# Patient Record
Sex: Female | Born: 1950
Health system: Southern US, Community
[De-identification: ages and names within clinical notes are randomized; demographics above are authoritative.]

## PROBLEM LIST (undated history)

## (undated) DIAGNOSIS — F32A Depression, unspecified: Secondary | ICD-10-CM

## (undated) DIAGNOSIS — F329 Major depressive disorder, single episode, unspecified: Secondary | ICD-10-CM

## (undated) DIAGNOSIS — I10 Essential (primary) hypertension: Secondary | ICD-10-CM

## (undated) DIAGNOSIS — E785 Hyperlipidemia, unspecified: Secondary | ICD-10-CM

## (undated) DIAGNOSIS — G2581 Restless legs syndrome: Secondary | ICD-10-CM

## (undated) DIAGNOSIS — T7840XA Allergy, unspecified, initial encounter: Secondary | ICD-10-CM

## (undated) HISTORY — PX: ABDOMINAL HYSTERECTOMY: SHX81

## (undated) HISTORY — DX: Essential (primary) hypertension: I10

## (undated) HISTORY — DX: Major depressive disorder, single episode, unspecified: F32.9

## (undated) HISTORY — DX: Restless legs syndrome: G25.81

## (undated) HISTORY — DX: Hyperlipidemia, unspecified: E78.5

## (undated) HISTORY — DX: Depression, unspecified: F32.A

## (undated) HISTORY — DX: Allergy, unspecified, initial encounter: T78.40XA

---

## 1997-09-30 ENCOUNTER — Inpatient Hospital Stay (HOSPITAL_COMMUNITY): Admission: RE | Admit: 1997-09-30 | Discharge: 1997-10-02 | Payer: Self-pay | Admitting: *Deleted

## 1998-04-22 ENCOUNTER — Other Ambulatory Visit: Admission: RE | Admit: 1998-04-22 | Discharge: 1998-04-22 | Payer: Self-pay | Admitting: *Deleted

## 1998-05-19 ENCOUNTER — Encounter: Payer: Self-pay | Admitting: *Deleted

## 1998-05-19 ENCOUNTER — Ambulatory Visit (HOSPITAL_COMMUNITY): Admission: RE | Admit: 1998-05-19 | Discharge: 1998-05-19 | Payer: Self-pay | Admitting: *Deleted

## 1999-11-10 ENCOUNTER — Ambulatory Visit (HOSPITAL_COMMUNITY): Admission: RE | Admit: 1999-11-10 | Discharge: 1999-11-10 | Payer: Self-pay | Admitting: *Deleted

## 1999-11-10 ENCOUNTER — Encounter: Payer: Self-pay | Admitting: *Deleted

## 1999-11-15 ENCOUNTER — Encounter: Payer: Self-pay | Admitting: *Deleted

## 1999-11-15 ENCOUNTER — Encounter: Admission: RE | Admit: 1999-11-15 | Discharge: 1999-11-15 | Payer: Self-pay | Admitting: *Deleted

## 2011-06-16 ENCOUNTER — Ambulatory Visit: Payer: Self-pay | Admitting: Family Medicine

## 2012-11-14 ENCOUNTER — Ambulatory Visit: Payer: Self-pay | Admitting: Family Medicine

## 2013-12-26 ENCOUNTER — Ambulatory Visit: Payer: Self-pay | Admitting: Family Medicine

## 2015-02-23 ENCOUNTER — Encounter: Payer: Self-pay | Admitting: Family Medicine

## 2015-02-23 ENCOUNTER — Other Ambulatory Visit: Payer: Self-pay

## 2015-02-23 DIAGNOSIS — Z1211 Encounter for screening for malignant neoplasm of colon: Secondary | ICD-10-CM

## 2015-05-31 ENCOUNTER — Other Ambulatory Visit: Payer: Self-pay | Admitting: Family Medicine

## 2015-05-31 NOTE — Telephone Encounter (Signed)
apt 

## 2015-06-17 ENCOUNTER — Encounter: Payer: Self-pay | Admitting: Family Medicine

## 2015-06-17 ENCOUNTER — Ambulatory Visit (INDEPENDENT_AMBULATORY_CARE_PROVIDER_SITE_OTHER): Payer: PRIVATE HEALTH INSURANCE | Admitting: Family Medicine

## 2015-06-17 VITALS — BP 109/69 | HR 80 | Temp 97.9°F | Ht 64.5 in | Wt 163.0 lb

## 2015-06-17 DIAGNOSIS — E785 Hyperlipidemia, unspecified: Secondary | ICD-10-CM

## 2015-06-17 DIAGNOSIS — I10 Essential (primary) hypertension: Secondary | ICD-10-CM | POA: Diagnosis not present

## 2015-06-17 DIAGNOSIS — F329 Major depressive disorder, single episode, unspecified: Secondary | ICD-10-CM | POA: Diagnosis not present

## 2015-06-17 DIAGNOSIS — Z23 Encounter for immunization: Secondary | ICD-10-CM

## 2015-06-17 DIAGNOSIS — F32A Depression, unspecified: Secondary | ICD-10-CM | POA: Insufficient documentation

## 2015-06-17 LAB — LP+ALT+AST PICCOLO, WAIVED
ALT (SGPT) PICCOLO, WAIVED: 19 U/L (ref 10–47)
AST (SGOT) PICCOLO, WAIVED: 25 U/L (ref 11–38)
Chol/HDL Ratio Piccolo,Waive: 4.3 mg/dL
Cholesterol Piccolo, Waived: 234 mg/dL — ABNORMAL HIGH (ref <200)
HDL CHOL PICCOLO, WAIVED: 54 mg/dL — AB (ref >59)
LDL Chol Calc Piccolo Waived: 150 mg/dL — ABNORMAL HIGH (ref <100)
TRIGLYCERIDES PICCOLO,WAIVED: 144 mg/dL (ref <150)
VLDL Chol Calc Piccolo,Waive: 29 mg/dL (ref <30)

## 2015-06-17 MED ORDER — LOSARTAN POTASSIUM-HCTZ 100-12.5 MG PO TABS: 1.0000 | ORAL_TABLET | Freq: Every day | ORAL | Status: DC

## 2015-06-17 MED ORDER — PRAVASTATIN SODIUM 20 MG PO TABS: 20.0000 mg | ORAL_TABLET | Freq: Every day | ORAL | Status: DC

## 2015-06-17 MED ORDER — FLUOXETINE HCL 20 MG PO CAPS: 60.0000 mg | ORAL_CAPSULE | Freq: Every day | ORAL | Status: DC

## 2015-06-17 NOTE — Assessment & Plan Note (Signed)
The current medical regimen is effective;  continue present plan and medications.  

## 2015-06-17 NOTE — Progress Notes (Signed)
BP 109/69 mmHg  Pulse 80  Temp(Src) 97.9 F (36.6 C)  Ht 5' 4.5" (1.638 m)  Wt 163 lb (73.936 kg)  BMI 27.56 kg/m2  SpO2 99%   Subjective:    Patient ID: Michelle Espinoza, female    DOB: 12/04/1950, 64 y.o.   MRN: [id number]  HPI: Michelle Espinoza is a 64 y.o. female  Chief Complaint  Patient presents with  . Hypertension  . Hyperlipidemia   Patient follow-up medication and med check Having some issues remembering medicines on a faithful basis and Mrs. from time to time Depression stable No side effects from medications  Relevant past medical, surgical, family and social history reviewed and updated as indicated. Interim medical history since our last visit reviewed. Allergies and medications reviewed and updated.  Review of Systems  Constitutional: Negative.   Respiratory: Negative.   Cardiovascular: Negative.     Per HPI unless specifically indicated above     Objective:    BP 109/69 mmHg  Pulse 80  Temp(Src) 97.9 F (36.6 C)  Ht 5' 4.5" (1.638 m)  Wt 163 lb (73.936 kg)  BMI 27.56 kg/m2  SpO2 99%  Wt Readings from Last 3 Encounters:  06/17/15 163 lb (73.936 kg)  11/10/14 162 lb (73.483 kg)    Physical Exam  Constitutional: She is oriented to person, place, and time. She appears well-developed and well-nourished. No distress.  HENT:  Head: Normocephalic and atraumatic.  Right Ear: Hearing normal.  Left Ear: Hearing normal.  Nose: Nose normal.  Eyes: Conjunctivae and lids are normal. Right eye exhibits no discharge. Left eye exhibits no discharge. No scleral icterus.  Cardiovascular: Normal rate, regular rhythm and normal heart sounds.   Pulmonary/Chest: Effort normal and breath sounds normal. No respiratory distress.  Musculoskeletal: Normal range of motion.  Neurological: She is alert and oriented to person, place, and time.  Skin: Skin is intact. No rash noted.  Psychiatric: She has a normal mood and affect. Her speech is normal and behavior is  normal. Judgment and thought content normal. Cognition and memory are normal.    No results found for this or any previous visit.    Assessment & Plan:   Problem List Items Addressed This Visit      Cardiovascular and Mediastinum   Essential hypertension    The current medical regimen is effective;  continue present plan and medications.       Relevant Medications   losartan-hydrochlorothiazide (HYZAAR) 100-12.5 MG tablet   pravastatin (PRAVACHOL) 20 MG tablet     Other   Hyperlipidemia    Poor control with intermittent compliance Discuss linking behaviors and ways to remember medicine every day Discuss risk factor modification and importance      Relevant Medications   losartan-hydrochlorothiazide (HYZAAR) 100-12.5 MG tablet   pravastatin (PRAVACHOL) 20 MG tablet   Depression    The current medical regimen is effective;  continue present plan and medications.       Relevant Medications   FLUoxetine (PROZAC) 20 MG capsule    Other Visit Diagnoses    Essential hypertension, benign    -  Primary    Relevant Medications    losartan-hydrochlorothiazide (HYZAAR) 100-12.5 MG tablet    pravastatin (PRAVACHOL) 20 MG tablet    Other Relevant Orders    LP+ALT+AST Piccolo, Waived    Basic metabolic panel    Hyperlipemia        Relevant Medications    losartan-hydrochlorothiazide (HYZAAR) 100-12.5 MG tablet  pravastatin (PRAVACHOL) 20 MG tablet    Other Relevant Orders    LP+ALT+AST Piccolo, Waived    Basic metabolic panel    Immunization due        Relevant Orders    Flu Vaccine QUAD 36+ mos PF IM (Fluarix & Fluzone Quad PF) (Completed)        Follow up plan: Return in about 6 months (around 12/16/2015) for Physical Exam.

## 2015-06-17 NOTE — Assessment & Plan Note (Signed)
Poor control with intermittent compliance Discuss linking behaviors and ways to remember medicine every day Discuss risk factor modification and importance

## 2015-06-18 LAB — BASIC METABOLIC PANEL
BUN/Creatinine Ratio: 18 (ref 11–26)
BUN: 14 mg/dL (ref 8–27)
CALCIUM: 10 mg/dL (ref 8.7–10.3)
CHLORIDE: 102 mmol/L (ref 97–106)
CO2: 24 mmol/L (ref 18–29)
Creatinine, Ser: 0.79 mg/dL (ref 0.57–1.00)
GFR calc Af Amer: 92 mL/min/{1.73_m2} (ref >59)
GFR calc non Af Amer: 80 mL/min/{1.73_m2} (ref >59)
GLUCOSE: 95 mg/dL (ref 65–99)
Potassium: 4.8 mmol/L (ref 3.5–5.2)
Sodium: 142 mmol/L (ref 136–144)

## 2015-06-21 ENCOUNTER — Encounter: Payer: Self-pay | Admitting: Family Medicine

## 2015-11-16 ENCOUNTER — Ambulatory Visit (INDEPENDENT_AMBULATORY_CARE_PROVIDER_SITE_OTHER): Payer: BLUE CROSS/BLUE SHIELD | Admitting: Family Medicine

## 2015-11-16 ENCOUNTER — Encounter: Payer: Self-pay | Admitting: Family Medicine

## 2015-11-16 VITALS — BP 121/79 | HR 76 | Temp 98.0°F | Ht 63.7 in | Wt 163.0 lb

## 2015-11-16 DIAGNOSIS — Z0000 Encounter for general adult medical examination without abnormal findings: Secondary | ICD-10-CM | POA: Diagnosis not present

## 2015-11-16 DIAGNOSIS — E785 Hyperlipidemia, unspecified: Secondary | ICD-10-CM | POA: Diagnosis not present

## 2015-11-16 DIAGNOSIS — I10 Essential (primary) hypertension: Secondary | ICD-10-CM | POA: Diagnosis not present

## 2015-11-16 DIAGNOSIS — F329 Major depressive disorder, single episode, unspecified: Secondary | ICD-10-CM

## 2015-11-16 DIAGNOSIS — F32A Depression, unspecified: Secondary | ICD-10-CM

## 2015-11-16 DIAGNOSIS — Z1239 Encounter for other screening for malignant neoplasm of breast: Secondary | ICD-10-CM

## 2015-11-16 LAB — URINALYSIS, ROUTINE W REFLEX MICROSCOPIC
BILIRUBIN UA: NEGATIVE
Glucose, UA: NEGATIVE
KETONES UA: NEGATIVE
LEUKOCYTES UA: NEGATIVE
Nitrite, UA: NEGATIVE
PROTEIN UA: NEGATIVE
SPEC GRAV UA: 1.015 (ref 1.005–1.030)
Urobilinogen, Ur: 0.2 mg/dL (ref 0.2–1.0)
pH, UA: 7 (ref 5.0–7.5)

## 2015-11-16 LAB — MICROSCOPIC EXAMINATION

## 2015-11-16 MED ORDER — PRAVASTATIN SODIUM 20 MG PO TABS: 20.0000 mg | ORAL_TABLET | Freq: Every day | ORAL | Status: DC

## 2015-11-16 MED ORDER — LOSARTAN POTASSIUM-HCTZ 100-12.5 MG PO TABS: 1.0000 | ORAL_TABLET | Freq: Every day | ORAL | Status: DC

## 2015-11-16 MED ORDER — FLUOXETINE HCL 20 MG PO CAPS: 60.0000 mg | ORAL_CAPSULE | Freq: Every day | ORAL | Status: DC

## 2015-11-16 NOTE — Assessment & Plan Note (Signed)
The current medical regimen is effective;  continue present plan and medications.  

## 2015-11-16 NOTE — Progress Notes (Signed)
BP 121/79 mmHg  Pulse 76  Temp(Src) 98 F (36.7 C)  Ht 5' 3.7" (1.618 m)  Wt 163 lb (73.936 kg)  BMI 28.24 kg/m2  SpO2 96%   Subjective:    Patient ID: Michelle Espinoza, female    DOB: 17-Aug-1950, 65 y.o.   MRN: [id number]  HPI: Michelle Espinoza is a 65 y.o. female  Chief Complaint  Patient presents with  . Annual Exam   Patient follow-up fluoxetine for nerves which are doing well. Score of 0 on depression screening Blood pressure doing well no complaints from medications Cholesterol doing well no complaints from medications Takes medications faithfully without side effects Has small sebaceous cysts which occasionally gets sore and is expressed as some questions about care and treatment.  Relevant past medical, surgical, family and social history reviewed and updated as indicated. Interim medical history since our last visit reviewed. Allergies and medications reviewed and updated.  Review of Systems  Constitutional: Negative.   HENT: Negative.   Eyes: Negative.   Respiratory: Negative.   Cardiovascular: Negative.   Gastrointestinal: Negative.   Endocrine: Negative.   Genitourinary: Negative.   Musculoskeletal: Negative.   Skin: Negative.   Allergic/Immunologic: Negative.   Neurological: Negative.   Hematological: Negative.   Psychiatric/Behavioral: Negative.     Per HPI unless specifically indicated above     Objective:    BP 121/79 mmHg  Pulse 76  Temp(Src) 98 F (36.7 C)  Ht 5' 3.7" (1.618 m)  Wt 163 lb (73.936 kg)  BMI 28.24 kg/m2  SpO2 96%  Wt Readings from Last 3 Encounters:  11/16/15 163 lb (73.936 kg)  06/17/15 163 lb (73.936 kg)  11/10/14 162 lb (73.483 kg)    Physical Exam  Constitutional: She is oriented to person, place, and time. She appears well-developed and well-nourished.  HENT:  Head: Normocephalic and atraumatic.  Right Ear: External ear normal.  Left Ear: External ear normal.  Nose: Nose normal.  Mouth/Throat: Oropharynx is  clear and moist.  Eyes: Conjunctivae and EOM are normal. Pupils are equal, round, and reactive to light.  Neck: Normal range of motion. Neck supple. Carotid bruit is not present.  Cardiovascular: Normal rate, regular rhythm and normal heart sounds.   No murmur heard. Pulmonary/Chest: Effort normal and breath sounds normal. She exhibits no mass. Right breast exhibits no mass, no skin change and no tenderness. Left breast exhibits no mass, no skin change and no tenderness. Breasts are symmetrical.  Abdominal: Soft. Bowel sounds are normal. There is no hepatosplenomegaly.  Musculoskeletal: Normal range of motion.  Neurological: She is alert and oriented to person, place, and time.  Skin: No rash noted.  Psychiatric: She has a normal mood and affect. Her behavior is normal. Judgment and thought content normal.    Results for orders placed or performed in visit on 06/17/15  LP+ALT+AST Piccolo, [employer]  Result Value Ref Range   ALT (SGPT) Piccolo, Waived 19 10 - 47 U/L   AST (SGOT) Piccolo, Waived 25 11 - 38 U/L   Cholesterol Piccolo, Waived 234 (H) <200 mg/dL   HDL Chol Piccolo, Waived 54 (L) >59 mg/dL   Triglycerides Piccolo,Waived 144 <150 mg/dL   Chol/HDL Ratio Piccolo,Waive 4.3 mg/dL   LDL Chol Calc Piccolo Waived 150 (H) <100 mg/dL   VLDL Chol Calc Piccolo,Waive 29 <30 mg/dL  Basic metabolic panel  Result Value Ref Range   Glucose 95 65 - 99 mg/dL   BUN 14 8 - 27 mg/dL   Creatinine,  Ser 0.79 0.57 - 1.00 mg/dL   GFR calc non Af Amer 80 >59 mL/min/1.73   GFR calc Af Amer 92 >59 mL/min/1.73   BUN/Creatinine Ratio 18 11 - 26   Sodium 142 136 - 144 mmol/L   Potassium 4.8 3.5 - 5.2 mmol/L   Chloride 102 97 - 106 mmol/L   CO2 24 18 - 29 mmol/L   Calcium 10.0 8.7 - 10.3 mg/dL      Assessment & Plan:   Problem List Items Addressed This Visit      Cardiovascular and Mediastinum   Essential hypertension    The current medical regimen is effective;  continue present plan and  medications.       Relevant Medications   losartan-hydrochlorothiazide (HYZAAR) 100-12.5 MG tablet   pravastatin (PRAVACHOL) 20 MG tablet     Other   Hyperlipidemia    The current medical regimen is effective;  continue present plan and medications.       Relevant Medications   losartan-hydrochlorothiazide (HYZAAR) 100-12.5 MG tablet   pravastatin (PRAVACHOL) 20 MG tablet   Depression    The current medical regimen is effective;  continue present plan and medications.       Relevant Medications   FLUoxetine (PROZAC) 20 MG capsule    Other Visit Diagnoses    Routine general medical examination at a health care facility    -  Primary    Relevant Orders    CBC with Differential/Platelet    Comprehensive metabolic panel    Lipid Panel w/o Chol/HDL Ratio    TSH    Urinalysis, Routine w reflex microscopic (not at [facility])    Healthcare maintenance        Relevant Orders    Hepatitis C Antibody    Breast cancer screening        Relevant Orders    MM Digital Screening      Patient education given on sebaceous cyst care and treatment will continue for now occasional expressing after hot compresses.   Follow up plan: Return in about 6 months (around 05/18/2016), or if symptoms worsen or fail to improve, for BMP, lipids, alt, ast.

## 2015-11-17 ENCOUNTER — Telehealth: Payer: Self-pay | Admitting: Family Medicine

## 2015-11-17 DIAGNOSIS — E875 Hyperkalemia: Secondary | ICD-10-CM

## 2015-11-17 LAB — COMPREHENSIVE METABOLIC PANEL
A/G RATIO: 1.6 (ref 1.2–2.2)
ALT: 14 IU/L (ref 0–32)
AST: 19 IU/L (ref 0–40)
Albumin: 4.4 g/dL (ref 3.6–4.8)
Alkaline Phosphatase: 78 IU/L (ref 39–117)
BILIRUBIN TOTAL: 0.3 mg/dL (ref 0.0–1.2)
BUN/Creatinine Ratio: 20 (ref 12–28)
BUN: 17 mg/dL (ref 8–27)
CHLORIDE: 101 mmol/L (ref 96–106)
CO2: 25 mmol/L (ref 18–29)
Calcium: 10.1 mg/dL (ref 8.7–10.3)
Creatinine, Ser: 0.85 mg/dL (ref 0.57–1.00)
GFR, EST AFRICAN AMERICAN: 84 mL/min/{1.73_m2} (ref >59)
GFR, EST NON AFRICAN AMERICAN: 73 mL/min/{1.73_m2} (ref >59)
GLOBULIN, TOTAL: 2.7 g/dL (ref 1.5–4.5)
GLUCOSE: 81 mg/dL (ref 65–99)
POTASSIUM: 5.4 mmol/L — AB (ref 3.5–5.2)
SODIUM: 141 mmol/L (ref 134–144)
Total Protein: 7.1 g/dL (ref 6.0–8.5)

## 2015-11-17 LAB — LIPID PANEL W/O CHOL/HDL RATIO
Cholesterol, Total: 224 mg/dL — ABNORMAL HIGH (ref 100–199)
HDL: 53 mg/dL (ref >39)
LDL Calculated: 144 mg/dL — ABNORMAL HIGH (ref 0–99)
Triglycerides: 137 mg/dL (ref 0–149)
VLDL CHOLESTEROL CAL: 27 mg/dL (ref 5–40)

## 2015-11-17 LAB — HEPATITIS C ANTIBODY

## 2015-11-17 LAB — CBC WITH DIFFERENTIAL/PLATELET
BASOS ABS: 0.1 10*3/uL (ref 0.0–0.2)
Basos: 1 %
EOS (ABSOLUTE): 0.5 10*3/uL — AB (ref 0.0–0.4)
Eos: 6 %
Hematocrit: 41.8 % (ref 34.0–46.6)
Hemoglobin: 14 g/dL (ref 11.1–15.9)
Immature Grans (Abs): 0 10*3/uL (ref 0.0–0.1)
Immature Granulocytes: 0 %
LYMPHS ABS: 1.8 10*3/uL (ref 0.7–3.1)
Lymphs: 24 %
MCH: 31.4 pg (ref 26.6–33.0)
MCHC: 33.5 g/dL (ref 31.5–35.7)
MCV: 94 fL (ref 79–97)
MONOS ABS: 0.6 10*3/uL (ref 0.1–0.9)
Monocytes: 8 %
NEUTROS ABS: 4.5 10*3/uL (ref 1.4–7.0)
Neutrophils: 61 %
PLATELETS: 333 10*3/uL (ref 150–379)
RBC: 4.46 x10E6/uL (ref 3.77–5.28)
RDW: 13.9 % (ref 12.3–15.4)
WBC: 7.4 10*3/uL (ref 3.4–10.8)

## 2015-11-17 LAB — TSH: TSH: 3.77 u[IU]/mL (ref 0.450–4.500)

## 2015-11-17 NOTE — Telephone Encounter (Signed)
Phone call Discussed with patient elevated potassium patient not taking her potassium supplements will recheck BMP in a couple of weeks. Asian taking her cholesterol which is elevated and do better with diet taking pravastatin without problems.

## 2015-12-23 ENCOUNTER — Ambulatory Visit
Admission: RE | Admit: 2015-12-23 | Discharge: 2015-12-23 | Disposition: A | Discharge status: Home / Self Care | Payer: BLUE CROSS/BLUE SHIELD | Source: Ambulatory Visit | Attending: Family Medicine | Admitting: Family Medicine

## 2015-12-23 ENCOUNTER — Other Ambulatory Visit: Payer: Self-pay | Admitting: Family Medicine

## 2015-12-23 ENCOUNTER — Encounter: Payer: Self-pay | Admitting: Family Medicine

## 2015-12-23 DIAGNOSIS — Z1239 Encounter for other screening for malignant neoplasm of breast: Secondary | ICD-10-CM

## 2015-12-23 DIAGNOSIS — Z1231 Encounter for screening mammogram for malignant neoplasm of breast: Secondary | ICD-10-CM | POA: Insufficient documentation

## 2015-12-27 ENCOUNTER — Other Ambulatory Visit: Payer: BLUE CROSS/BLUE SHIELD

## 2015-12-27 DIAGNOSIS — E875 Hyperkalemia: Secondary | ICD-10-CM

## 2015-12-28 ENCOUNTER — Telehealth: Payer: Self-pay | Admitting: Family Medicine

## 2015-12-28 LAB — BASIC METABOLIC PANEL
BUN/Creatinine Ratio: 20 (ref 12–28)
BUN: 17 mg/dL (ref 8–27)
CALCIUM: 9.5 mg/dL (ref 8.7–10.3)
CO2: 22 mmol/L (ref 18–29)
Chloride: 102 mmol/L (ref 96–106)
Creatinine, Ser: 0.83 mg/dL (ref 0.57–1.00)
GFR, EST AFRICAN AMERICAN: 86 mL/min/{1.73_m2} (ref >59)
GFR, EST NON AFRICAN AMERICAN: 75 mL/min/{1.73_m2} (ref >59)
Glucose: 74 mg/dL (ref 65–99)
POTASSIUM: 5.3 mmol/L — AB (ref 3.5–5.2)
Sodium: 140 mmol/L (ref 134–144)

## 2015-12-28 NOTE — Telephone Encounter (Signed)
Phone call Discussed with patient potassium still slightly high and not doing any potassium supplements will observe recheck BMP next visit

## 2016-01-10 ENCOUNTER — Other Ambulatory Visit: Payer: Self-pay

## 2016-01-10 DIAGNOSIS — Z1211 Encounter for screening for malignant neoplasm of colon: Secondary | ICD-10-CM

## 2016-01-10 LAB — FECAL OCCULT BLOOD, GUAIAC
SPECIMEN 3: NEGATIVE
Specimen 1: NEGATIVE
Specimen 2: NEGATIVE

## 2016-05-29 ENCOUNTER — Ambulatory Visit (INDEPENDENT_AMBULATORY_CARE_PROVIDER_SITE_OTHER): Payer: BLUE CROSS/BLUE SHIELD | Admitting: Family Medicine

## 2016-05-29 ENCOUNTER — Encounter: Payer: Self-pay | Admitting: Family Medicine

## 2016-05-29 VITALS — BP 128/85 | HR 76 | Temp 98.2°F | Wt 171.0 lb

## 2016-05-29 DIAGNOSIS — Z23 Encounter for immunization: Secondary | ICD-10-CM | POA: Diagnosis not present

## 2016-05-29 DIAGNOSIS — I10 Essential (primary) hypertension: Secondary | ICD-10-CM

## 2016-05-29 DIAGNOSIS — E782 Mixed hyperlipidemia: Secondary | ICD-10-CM

## 2016-05-29 LAB — LP+ALT+AST PICCOLO, WAIVED
ALT (SGPT) PICCOLO, WAIVED: 14 U/L (ref 10–47)
AST (SGOT) Piccolo, Waived: 26 U/L (ref 11–38)
CHOL/HDL RATIO PICCOLO,WAIVE: 3.8 mg/dL
Cholesterol Piccolo, Waived: 135 mg/dL (ref <200)
HDL CHOL PICCOLO, WAIVED: 55 mg/dL — AB (ref >59)
LDL Chol Calc Piccolo Waived: 129 mg/dL — ABNORMAL HIGH (ref <100)
TRIGLYCERIDES PICCOLO,WAIVED: 135 mg/dL (ref <150)
VLDL CHOL CALC PICCOLO,WAIVE: 27 mg/dL (ref <30)

## 2016-05-29 NOTE — Progress Notes (Signed)
   BP 128/85   Pulse 76   Temp 98.2 F (36.8 C)   Wt 171 lb (77.6 kg)   SpO2 98%   BMI 29.63 kg/m    Subjective:    Patient ID: Michelle Espinoza, female    DOB: [birth year], 65 y.o.   MRN: [id number]  HPI: Michelle Espinoza is a 65 y.o. female  Chief Complaint  Patient presents with  . Hyperlipidemia  . Hypertension  . Depression    See's Dr.Kapur for follow up   Patient doing well no complaints from medications taking losartan and hydrochlorothiazide without problems or issues Taking pravastatin without problems or issues and apparent good control blood pressure doing well. Weight has a hard time recently. Relevant past medical, surgical, family and social history reviewed and updated as indicated. Interim medical history since our last visit reviewed. Allergies and medications reviewed and updated.  Review of Systems  Respiratory: Negative.   Cardiovascular: Negative.     Per HPI unless specifically indicated above     Objective:    BP 128/85   Pulse 76   Temp 98.2 F (36.8 C)   Wt 171 lb (77.6 kg)   SpO2 98%   BMI 29.63 kg/m   Wt Readings from Last 3 Encounters:  05/29/16 171 lb (77.6 kg)  11/16/15 163 lb (73.9 kg)  06/17/15 163 lb (73.9 kg)    Physical Exam  Constitutional: She is oriented to person, place, and time. She appears well-developed and well-nourished. No distress.  HENT:  Head: Normocephalic and atraumatic.  Right Ear: Hearing normal.  Left Ear: Hearing normal.  Nose: Nose normal.  Eyes: Conjunctivae and lids are normal. Right eye exhibits no discharge. Left eye exhibits no discharge. No scleral icterus.  Cardiovascular: Normal rate, regular rhythm and normal heart sounds.   Pulmonary/Chest: Effort normal and breath sounds normal. No respiratory distress.  Musculoskeletal: Normal range of motion.  Neurological: She is alert and oriented to person, place, and time.  Skin: Skin is intact. No rash noted.  Psychiatric: She has a normal mood and  affect. Her speech is normal and behavior is normal. Judgment and thought content normal. Cognition and memory are normal.        Assessment & Plan:   Problem List Items Addressed This Visit      Cardiovascular and Mediastinum   Essential hypertension - Primary    The current medical regimen is effective;  continue present plan and medications.       Relevant Orders   Basic metabolic panel     Other   Hyperlipidemia    Discussed need for little better control. Patient will do better with diet nutrition exercise weight loss       Relevant Orders   LP+ALT+AST Piccolo, [place]    Other Visit Diagnoses    Needs flu shot           Follow up plan: Return in about 6 months (around 11/26/2016) for Physical Exam.

## 2016-05-29 NOTE — Assessment & Plan Note (Addendum)
Discussed need for little better control. Patient will do better with diet nutrition exercise weight loss

## 2016-05-29 NOTE — Assessment & Plan Note (Signed)
The current medical regimen is effective;  continue present plan and medications.  

## 2016-05-30 ENCOUNTER — Encounter: Payer: Self-pay | Admitting: Family Medicine

## 2016-05-30 LAB — BASIC METABOLIC PANEL
BUN / CREAT RATIO: 17 (ref 12–28)
BUN: 16 mg/dL (ref 8–27)
CHLORIDE: 101 mmol/L (ref 96–106)
CO2: 21 mmol/L (ref 18–29)
CREATININE: 0.96 mg/dL (ref 0.57–1.00)
Calcium: 9.7 mg/dL (ref 8.7–10.3)
GFR calc Af Amer: 72 mL/min/{1.73_m2} (ref >59)
GFR calc non Af Amer: 63 mL/min/{1.73_m2} (ref >59)
GLUCOSE: 83 mg/dL (ref 65–99)
Potassium: 5 mmol/L (ref 3.5–5.2)
SODIUM: 141 mmol/L (ref 134–144)

## 2016-11-13 DIAGNOSIS — F331 Major depressive disorder, recurrent, moderate: Secondary | ICD-10-CM | POA: Diagnosis not present

## 2016-11-21 ENCOUNTER — Encounter: Payer: BLUE CROSS/BLUE SHIELD | Admitting: Family Medicine

## 2016-11-30 ENCOUNTER — Encounter: Payer: Self-pay | Admitting: Family Medicine

## 2017-01-09 ENCOUNTER — Encounter: Payer: Self-pay | Admitting: Family Medicine

## 2017-01-09 ENCOUNTER — Ambulatory Visit (INDEPENDENT_AMBULATORY_CARE_PROVIDER_SITE_OTHER): Payer: Medicare Other | Admitting: Family Medicine

## 2017-01-09 VITALS — BP 119/81 | HR 68 | Temp 98.4°F | Ht 66.63 in | Wt 169.9 lb

## 2017-01-09 DIAGNOSIS — Z23 Encounter for immunization: Secondary | ICD-10-CM | POA: Diagnosis not present

## 2017-01-09 DIAGNOSIS — W57XXXA Bitten or stung by nonvenomous insect and other nonvenomous arthropods, initial encounter: Secondary | ICD-10-CM | POA: Diagnosis not present

## 2017-01-09 DIAGNOSIS — E782 Mixed hyperlipidemia: Secondary | ICD-10-CM

## 2017-01-09 DIAGNOSIS — Z1329 Encounter for screening for other suspected endocrine disorder: Secondary | ICD-10-CM | POA: Diagnosis not present

## 2017-01-09 DIAGNOSIS — S80862A Insect bite (nonvenomous), left lower leg, initial encounter: Secondary | ICD-10-CM

## 2017-01-09 DIAGNOSIS — S80869A Insect bite (nonvenomous), unspecified lower leg, initial encounter: Secondary | ICD-10-CM | POA: Insufficient documentation

## 2017-01-09 DIAGNOSIS — S91312A Laceration without foreign body, left foot, initial encounter: Secondary | ICD-10-CM

## 2017-01-09 DIAGNOSIS — Z780 Asymptomatic menopausal state: Secondary | ICD-10-CM | POA: Diagnosis not present

## 2017-01-09 DIAGNOSIS — Z1239 Encounter for other screening for malignant neoplasm of breast: Secondary | ICD-10-CM

## 2017-01-09 DIAGNOSIS — I10 Essential (primary) hypertension: Secondary | ICD-10-CM | POA: Diagnosis not present

## 2017-01-09 DIAGNOSIS — Z131 Encounter for screening for diabetes mellitus: Secondary | ICD-10-CM

## 2017-01-09 DIAGNOSIS — F3342 Major depressive disorder, recurrent, in full remission: Secondary | ICD-10-CM | POA: Diagnosis not present

## 2017-01-09 DIAGNOSIS — Z0000 Encounter for general adult medical examination without abnormal findings: Secondary | ICD-10-CM

## 2017-01-09 LAB — URINALYSIS, ROUTINE W REFLEX MICROSCOPIC
Bilirubin, UA: NEGATIVE
Glucose, UA: NEGATIVE
Ketones, UA: NEGATIVE
Leukocytes, UA: NEGATIVE
NITRITE UA: NEGATIVE
PH UA: 7 (ref 5.0–7.5)
Protein, UA: NEGATIVE
Specific Gravity, UA: 1.015 (ref 1.005–1.030)
Urobilinogen, Ur: 1 mg/dL (ref 0.2–1.0)

## 2017-01-09 LAB — MICROSCOPIC EXAMINATION: Bacteria, UA: NONE SEEN

## 2017-01-09 MED ORDER — LOSARTAN POTASSIUM-HCTZ 100-12.5 MG PO TABS: 1.0000 | ORAL_TABLET | Freq: Every day | ORAL | 4 refills | Status: DC

## 2017-01-09 MED ORDER — PRAVASTATIN SODIUM 20 MG PO TABS: 20.0000 mg | ORAL_TABLET | Freq: Every day | ORAL | 4 refills | Status: DC

## 2017-01-09 NOTE — Progress Notes (Signed)
 BP 119/81   Pulse 68   Temp 98.4 F (36.9 C) (Oral)   Ht 5' 6.63" (1.693 m)   Wt 169 lb 14.4 oz (77.1 kg)   SpO2 99%   BMI 26.90 kg/m    Subjective:    Patient ID: Michelle Espinoza, female    DOB: 10/18/1950, 66 y.o.   MRN: [id number]  HPI: Michelle Espinoza is a 66 y.o. female  Chief Complaint  Patient presents with  . Annual Exam  . Tick Removal  . Nevus    Right arm  AWV metrics met  Patient with slowly enlarging brown mole right near her axilla area patient getting irritated by this mole and it occasionally itches and bothers her. It catches on her clothes. Also had a tick bite on right foot is gotten red and festered about 3 weeks ago no other tickborne illness symptoms. Depressions doing well on fluoxetine 60 Dr. Kapur is managing and doing well. Blood pressure good control no issues with medications. Cholesterol doing well on Pravachol with no issues.  Relevant past medical, surgical, family and social history reviewed and updated as indicated. Interim medical history since our last visit reviewed. Allergies and medications reviewed and updated.  Review of Systems  Constitutional: Negative.   HENT: Negative.   Eyes: Negative.   Respiratory: Negative.   Cardiovascular: Negative.   Gastrointestinal: Negative.   Endocrine: Negative.   Genitourinary: Negative.   Musculoskeletal: Negative.   Skin: Negative.   Allergic/Immunologic: Negative.   Neurological: Negative.   Hematological: Negative.   Psychiatric/Behavioral: Negative.     Per HPI unless specifically indicated above     Objective:    BP 119/81   Pulse 68   Temp 98.4 F (36.9 C) (Oral)   Ht 5' 6.63" (1.693 m)   Wt 169 lb 14.4 oz (77.1 kg)   SpO2 99%   BMI 26.90 kg/m   Wt Readings from Last 3 Encounters:  01/09/17 169 lb 14.4 oz (77.1 kg)  05/29/16 171 lb (77.6 kg)  11/16/15 163 lb (73.9 kg)    Physical Exam  Constitutional: She is oriented to person, place, and time. She appears  well-developed and well-nourished.  HENT:  Head: Normocephalic and atraumatic.  Right Ear: External ear normal.  Left Ear: External ear normal.  Nose: Nose normal.  Mouth/Throat: Oropharynx is clear and moist.  Eyes: Conjunctivae and EOM are normal. Pupils are equal, round, and reactive to light.  Neck: Normal range of motion. Neck supple. Carotid bruit is not present.  Cardiovascular: Normal rate, regular rhythm and normal heart sounds.   No murmur heard. Pulmonary/Chest: Effort normal and breath sounds normal. She exhibits no mass. Right breast exhibits no mass, no skin change and no tenderness. Left breast exhibits no mass, no skin change and no tenderness. Breasts are symmetrical.  Abdominal: Soft. Bowel sounds are normal. There is no hepatosplenomegaly.  Musculoskeletal: Normal range of motion.  Neurological: She is alert and oriented to person, place, and time.  Skin: No rash noted.  Multiple right anterior axillary area will schedule excision  Psychiatric: She has a normal mood and affect. Her behavior is normal. Judgment and thought content normal.    Results for orders placed or performed in visit on 05/29/16  LP+ALT+AST Piccolo, Waived  Result Value Ref Range   ALT (SGPT) Piccolo, Waived 14 10 - 47 U/L   AST (SGOT) Piccolo, Waived 26 11 - 38 U/L   Cholesterol Piccolo, Waived 135 <200 mg/dL   HDL   Chol Piccolo, Waived 55 (L) >59 mg/dL   Triglycerides Piccolo,Waived 135 <150 mg/dL   Chol/HDL Ratio Piccolo,Waive 3.8 mg/dL   LDL Chol Calc Piccolo Waived 129 (H) <100 mg/dL   VLDL Chol Calc Piccolo,Waive 27 <30 mg/dL  Basic metabolic panel  Result Value Ref Range   Glucose 83 65 - 99 mg/dL   BUN 16 8 - 27 mg/dL   Creatinine, Ser 0.96 0.57 - 1.00 mg/dL   GFR calc non Af Amer 63 >59 mL/min/1.73   GFR calc Af Amer 72 >59 mL/min/1.73   BUN/Creatinine Ratio 17 12 - 28   Sodium 141 134 - 144 mmol/L   Potassium 5.0 3.5 - 5.2 mmol/L   Chloride 101 96 - 106 mmol/L   CO2 21 18 - 29  mmol/L   Calcium 9.7 8.7 - 10.3 mg/dL      Assessment & Plan:   Problem List Items Addressed This Visit      Cardiovascular and Mediastinum   Essential hypertension    The current medical regimen is effective;  continue present plan and medications.       Relevant Medications   losartan-hydrochlorothiazide (HYZAAR) 100-12.5 MG tablet   pravastatin (PRAVACHOL) 20 MG tablet   Other Relevant Orders   Comprehensive metabolic panel     Other   Hyperlipidemia    The current medical regimen is effective;  continue present plan and medications.       Relevant Medications   losartan-hydrochlorothiazide (HYZAAR) 100-12.5 MG tablet   pravastatin (PRAVACHOL) 20 MG tablet   Other Relevant Orders   Lipid panel   Depression    The current medical regimen is effective;  continue present plan and medications.        Other Visit Diagnoses    Annual physical exam       Screening for diabetes mellitus (DM)       Relevant Orders   CBC with Differential/Platelet   Urinalysis, Routine w reflex microscopic   Thyroid disorder screen       Relevant Orders   TSH   Postmenopausal estrogen deficiency       Relevant Orders   DG Bone Density   Breast cancer screening       Relevant Orders   MM DIGITAL SCREENING BILATERAL      Also for excision of mole right axillary area will be scheduled later this summer. Follow up plan: Return in about 6 months (around 07/12/2017) for BMP,  Lipids, ALT, AST.      

## 2017-01-09 NOTE — Assessment & Plan Note (Signed)
The current medical regimen is effective;  continue present plan and medications.  

## 2017-01-09 NOTE — Addendum Note (Signed)
Addended by: Golden Pop A on: 01/09/2017 03:55 PM   Modules accepted: Orders

## 2017-01-09 NOTE — Addendum Note (Signed)
Addended by: Gerda Diss A on: 01/09/2017 03:50 PM   Modules accepted: Orders

## 2017-01-09 NOTE — Assessment & Plan Note (Signed)
Healing tick bite really on patient's left foot healing well will need tetanus shot

## 2017-01-10 LAB — LIPID PANEL
CHOLESTEROL TOTAL: 257 mg/dL — AB (ref 100–199)
Chol/HDL Ratio: 5.5 ratio — ABNORMAL HIGH (ref 0.0–4.4)
HDL: 47 mg/dL (ref >39)
LDL CALC: 161 mg/dL — AB (ref 0–99)
TRIGLYCERIDES: 245 mg/dL — AB (ref 0–149)
VLDL CHOLESTEROL CAL: 49 mg/dL — AB (ref 5–40)

## 2017-01-10 LAB — CBC WITH DIFFERENTIAL/PLATELET
BASOS ABS: 0.1 10*3/uL (ref 0.0–0.2)
Basos: 1 %
EOS (ABSOLUTE): 0.3 10*3/uL (ref 0.0–0.4)
Eos: 4 %
HEMOGLOBIN: 13.6 g/dL (ref 11.1–15.9)
Hematocrit: 41.9 % (ref 34.0–46.6)
IMMATURE GRANULOCYTES: 0 %
Immature Grans (Abs): 0 10*3/uL (ref 0.0–0.1)
LYMPHS ABS: 2.4 10*3/uL (ref 0.7–3.1)
LYMPHS: 30 %
MCH: 30.6 pg (ref 26.6–33.0)
MCHC: 32.5 g/dL (ref 31.5–35.7)
MCV: 94 fL (ref 79–97)
MONOCYTES: 8 %
Monocytes Absolute: 0.6 10*3/uL (ref 0.1–0.9)
NEUTROS PCT: 57 %
Neutrophils Absolute: 4.6 10*3/uL (ref 1.4–7.0)
Platelets: 397 10*3/uL — ABNORMAL HIGH (ref 150–379)
RBC: 4.45 x10E6/uL (ref 3.77–5.28)
RDW: 13.9 % (ref 12.3–15.4)
WBC: 8.1 10*3/uL (ref 3.4–10.8)

## 2017-01-10 LAB — COMPREHENSIVE METABOLIC PANEL
ALBUMIN: 4.1 g/dL (ref 3.6–4.8)
ALK PHOS: 94 IU/L (ref 39–117)
ALT: 16 IU/L (ref 0–32)
AST: 22 IU/L (ref 0–40)
Albumin/Globulin Ratio: 1.2 (ref 1.2–2.2)
BUN / CREAT RATIO: 16 (ref 12–28)
BUN: 16 mg/dL (ref 8–27)
Bilirubin Total: 0.4 mg/dL (ref 0.0–1.2)
CALCIUM: 9.8 mg/dL (ref 8.7–10.3)
CO2: 24 mmol/L (ref 20–29)
CREATININE: 1.01 mg/dL — AB (ref 0.57–1.00)
Chloride: 104 mmol/L (ref 96–106)
GFR calc Af Amer: 68 mL/min/{1.73_m2} (ref >59)
GFR, EST NON AFRICAN AMERICAN: 59 mL/min/{1.73_m2} — AB (ref >59)
GLUCOSE: 117 mg/dL — AB (ref 65–99)
Globulin, Total: 3.3 g/dL (ref 1.5–4.5)
Potassium: 5.3 mmol/L — ABNORMAL HIGH (ref 3.5–5.2)
Sodium: 143 mmol/L (ref 134–144)
Total Protein: 7.4 g/dL (ref 6.0–8.5)

## 2017-01-10 LAB — TSH: TSH: 2.97 u[IU]/mL (ref 0.450–4.500)

## 2017-01-15 ENCOUNTER — Telehealth: Payer: Self-pay | Admitting: Family Medicine

## 2017-01-15 NOTE — Telephone Encounter (Signed)
Phone call Discussed with patient slight elevation of potassium patient doing a lot of of bananas. Patient will likely not. Discuss elevated cholesterol patient will also do better.

## 2017-02-27 ENCOUNTER — Encounter: Payer: Self-pay | Admitting: Family Medicine

## 2017-02-27 ENCOUNTER — Ambulatory Visit (INDEPENDENT_AMBULATORY_CARE_PROVIDER_SITE_OTHER): Payer: Medicare Other | Admitting: Family Medicine

## 2017-02-27 VITALS — BP 131/78 | HR 105 | Temp 97.8°F | Wt 168.6 lb

## 2017-02-27 DIAGNOSIS — Z1211 Encounter for screening for malignant neoplasm of colon: Secondary | ICD-10-CM | POA: Diagnosis not present

## 2017-02-27 DIAGNOSIS — L989 Disorder of the skin and subcutaneous tissue, unspecified: Secondary | ICD-10-CM | POA: Diagnosis not present

## 2017-02-27 DIAGNOSIS — D225 Melanocytic nevi of trunk: Secondary | ICD-10-CM

## 2017-02-27 DIAGNOSIS — L821 Other seborrheic keratosis: Secondary | ICD-10-CM | POA: Diagnosis not present

## 2017-02-27 DIAGNOSIS — Z1212 Encounter for screening for malignant neoplasm of rectum: Secondary | ICD-10-CM | POA: Diagnosis not present

## 2017-02-27 DIAGNOSIS — R450 Nervousness: Secondary | ICD-10-CM

## 2017-02-27 NOTE — Assessment & Plan Note (Signed)
Skin lesion removal pending pathology

## 2017-02-27 NOTE — Progress Notes (Signed)
BP 131/78 (BP Location: Left Arm, Patient Position: Sitting, Cuff Size: Normal)   Pulse (!) 105   Temp 97.8 F (36.6 C)   Wt 168 lb 9 oz (76.5 kg)   SpO2 98%   BMI 26.69 kg/m    Subjective:    Patient ID: Michelle Espinoza, female    DOB: [birth year], 66 y.o.   MRN: [id number]  HPI: Michelle Espinoza is a 66 y.o. female  Chief Complaint  Patient presents with  . Nevus   Mole right axilla area gets inflamed catches and bleeds. Requesting removal.  Relevant past medical, surgical, family and social history reviewed and updated as indicated. Interim medical history since our last visit reviewed. Allergies and medications reviewed and updated.  Review of Systems  Per HPI unless specifically indicated above     Objective:    BP 131/78 (BP Location: Left Arm, Patient Position: Sitting, Cuff Size: Normal)   Pulse (!) 105   Temp 97.8 F (36.6 C)   Wt 168 lb 9 oz (76.5 kg)   SpO2 98%   BMI 26.69 kg/m   Wt Readings from Last 3 Encounters:  02/27/17 168 lb 9 oz (76.5 kg)  01/09/17 169 lb 14.4 oz (77.1 kg)  05/29/16 171 lb (77.6 kg)    Physical Exam  Skin:  67mm mole right axillary area prepped with Betadine and alcohol and infiltrated with Xylocaine with epinephrine and excised the difficulty based destroyed was Surgitron unit. Lesion sent for pathology patient tolerated procedure well. Procedure performed after informed consent.    Results for orders placed or performed in visit on 01/09/17  Microscopic Examination  Result Value Ref Range   WBC, UA 0-5 0 - 5 /hpf   RBC, UA 3-5 (A) 0 - 2 /hpf   Epithelial Cells (non renal) 0-10 0 - 10 /hpf   Bacteria, UA None seen None seen/Few  CBC with Differential/Platelet  Result Value Ref Range   WBC 8.1 3.4 - 10.8 x10E3/uL   RBC 4.45 3.77 - 5.28 x10E6/uL   Hemoglobin 13.6 11.1 - 15.9 g/dL   Hematocrit 41.9 34.0 - 46.6 %   MCV 94 79 - 97 fL   MCH 30.6 26.6 - 33.0 pg   MCHC 32.5 31.5 - 35.7 g/dL   RDW 13.9 12.3 - 15.4 %   Platelets 397 (H) 150 - 379 x10E3/uL   Neutrophils 57 Not Estab. %   Lymphs 30 Not Estab. %   Monocytes 8 Not Estab. %   Eos 4 Not Estab. %   Basos 1 Not Estab. %   Neutrophils Absolute 4.6 1.4 - 7.0 x10E3/uL   Lymphocytes Absolute 2.4 0.7 - 3.1 x10E3/uL   Monocytes Absolute 0.6 0.1 - 0.9 x10E3/uL   EOS (ABSOLUTE) 0.3 0.0 - 0.4 x10E3/uL   Basophils Absolute 0.1 0.0 - 0.2 x10E3/uL   Immature Granulocytes 0 Not Estab. %   Immature Grans (Abs) 0.0 0.0 - 0.1 x10E3/uL  Comprehensive metabolic panel  Result Value Ref Range   Glucose 117 (H) 65 - 99 mg/dL   BUN 16 8 - 27 mg/dL   Creatinine, Ser 1.01 (H) 0.57 - 1.00 mg/dL   GFR calc non Af Amer 59 (L) >59 mL/min/1.73   GFR calc Af Amer 68 >59 mL/min/1.73   BUN/Creatinine Ratio 16 12 - 28   Sodium 143 134 - 144 mmol/L   Potassium 5.3 (H) 3.5 - 5.2 mmol/L   Chloride 104 96 - 106 mmol/L   CO2 24 20 - 29  mmol/L   Calcium 9.8 8.7 - 10.3 mg/dL   Total Protein 7.4 6.0 - 8.5 g/dL   Albumin 4.1 3.6 - 4.8 g/dL   Globulin, Total 3.3 1.5 - 4.5 g/dL   Albumin/Globulin Ratio 1.2 1.2 - 2.2   Bilirubin Total 0.4 0.0 - 1.2 mg/dL   Alkaline Phosphatase 94 39 - 117 IU/L   AST 22 0 - 40 IU/L   ALT 16 0 - 32 IU/L  Lipid panel  Result Value Ref Range   Cholesterol, Total 257 (H) 100 - 199 mg/dL   Triglycerides 245 (H) 0 - 149 mg/dL   HDL 47 >39 mg/dL   VLDL Cholesterol Cal 49 (H) 5 - 40 mg/dL   LDL Calculated 161 (H) 0 - 99 mg/dL   Chol/HDL Ratio 5.5 (H) 0.0 - 4.4 ratio  TSH  Result Value Ref Range   TSH 2.970 0.450 - 4.500 uIU/mL  Urinalysis, Routine w reflex microscopic  Result Value Ref Range   Specific Gravity, UA 1.015 1.005 - 1.030   pH, UA 7.0 5.0 - 7.5   Color, UA Yellow Yellow   Appearance Ur Clear Clear   Leukocytes, UA Negative Negative   Protein, UA Negative Negative/Trace   Glucose, UA Negative Negative   Ketones, UA Negative Negative   RBC, UA 2+ (A) Negative   Bilirubin, UA Negative Negative   Urobilinogen, Ur 1.0 0.2 - 1.0  mg/dL   Nitrite, UA Negative Negative   Microscopic Examination See below:       Assessment & Plan:   Problem List Items Addressed This Visit      Musculoskeletal and Integument   Skin lesion of right upper extremity    Skin lesion removal pending pathology       Other Visit Diagnoses    Nervus    -  Primary   Relevant Orders   Dermatology pathology       Follow up plan: Return for Dec,  BMP,  Lipids, ALT, AST.

## 2017-03-02 DIAGNOSIS — F331 Major depressive disorder, recurrent, moderate: Secondary | ICD-10-CM | POA: Diagnosis not present

## 2017-03-07 LAB — COLOGUARD: Cologuard: NEGATIVE

## 2017-04-09 ENCOUNTER — Ambulatory Visit
Admission: RE | Admit: 2017-04-09 | Discharge: 2017-04-09 | Disposition: A | Discharge status: Home / Self Care | Payer: Medicare Other | Source: Ambulatory Visit | Attending: Family Medicine | Admitting: Family Medicine

## 2017-04-09 ENCOUNTER — Encounter: Payer: Self-pay | Admitting: Family Medicine

## 2017-04-09 DIAGNOSIS — Z13820 Encounter for screening for osteoporosis: Secondary | ICD-10-CM | POA: Insufficient documentation

## 2017-04-09 DIAGNOSIS — Z1231 Encounter for screening mammogram for malignant neoplasm of breast: Secondary | ICD-10-CM | POA: Diagnosis not present

## 2017-04-09 DIAGNOSIS — Z780 Asymptomatic menopausal state: Secondary | ICD-10-CM

## 2017-04-09 DIAGNOSIS — Z1239 Encounter for other screening for malignant neoplasm of breast: Secondary | ICD-10-CM

## 2017-06-11 ENCOUNTER — Ambulatory Visit (INDEPENDENT_AMBULATORY_CARE_PROVIDER_SITE_OTHER): Payer: Medicare Other | Admitting: Family Medicine

## 2017-06-11 ENCOUNTER — Encounter: Payer: Self-pay | Admitting: Family Medicine

## 2017-06-11 VITALS — BP 132/80 | HR 87 | Wt 168.0 lb

## 2017-06-11 DIAGNOSIS — E782 Mixed hyperlipidemia: Secondary | ICD-10-CM | POA: Diagnosis not present

## 2017-06-11 DIAGNOSIS — Z23 Encounter for immunization: Secondary | ICD-10-CM

## 2017-06-11 DIAGNOSIS — I10 Essential (primary) hypertension: Secondary | ICD-10-CM | POA: Diagnosis not present

## 2017-06-11 LAB — LP+ALT+AST PICCOLO, WAIVED
ALT (SGPT) Piccolo, Waived: 17 U/L (ref 10–47)
AST (SGOT) PICCOLO, WAIVED: 20 U/L (ref 11–38)
CHOL/HDL RATIO PICCOLO,WAIVE: 4.1 mg/dL
CHOLESTEROL PICCOLO, WAIVED: 235 mg/dL — AB (ref <200)
HDL CHOL PICCOLO, WAIVED: 57 mg/dL — AB (ref >59)
LDL CHOL CALC PICCOLO WAIVED: 147 mg/dL — AB (ref <100)
Triglycerides Piccolo,Waived: 156 mg/dL — ABNORMAL HIGH (ref <150)
VLDL Chol Calc Piccolo,Waive: 31 mg/dL — ABNORMAL HIGH (ref <30)

## 2017-06-11 MED ORDER — EZETIMIBE 10 MG PO TABS: 10.0000 mg | ORAL_TABLET | Freq: Every day | ORAL | 2 refills | Status: DC

## 2017-06-11 NOTE — Assessment & Plan Note (Signed)
With continued elevated cholesterol we will add Zetia and avoid increasing her Pravachol dose because of history of elevated liver function

## 2017-06-11 NOTE — Progress Notes (Signed)
   BP 132/80 (BP Location: Left Arm)   Pulse 87   Wt 168 lb (76.2 kg)   SpO2 96%   BMI 26.60 kg/m    Subjective:    Patient ID: Michelle Espinoza, female    DOB: 1951-07-06, 66 y.o.   MRN: [id number]  HPI: Michelle Espinoza is a 66 y.o. female  Chief Complaint  Patient presents with  . Follow-up  patient follow-up doing well meds for nerves doing well with good control no depression issues. Blood pressure good control taking without problems and good control. Pravastatin for cholesterol again with no issues and taking without problems. Patient's had elevated liver enzymes with lovastatin in the past Relevant past medical, surgical, family and social history reviewed and updated as indicated. Interim medical history since our last visit reviewed. Allergies and medications reviewed and updated.  Review of Systems  Constitutional: Negative.   Respiratory: Negative.   Cardiovascular: Negative.     Per HPI unless specifically indicated above     Objective:    BP 132/80 (BP Location: Left Arm)   Pulse 87   Wt 168 lb (76.2 kg)   SpO2 96%   BMI 26.60 kg/m   Wt Readings from Last 3 Encounters:  06/11/17 168 lb (76.2 kg)  02/27/17 168 lb 9 oz (76.5 kg)  01/09/17 169 lb 14.4 oz (77.1 kg)    Physical Exam  Constitutional: She is oriented to person, place, and time. She appears well-developed and well-nourished.  HENT:  Head: Normocephalic and atraumatic.  Eyes: Conjunctivae and EOM are normal.  Neck: Normal range of motion.  Cardiovascular: Normal rate, regular rhythm and normal heart sounds.  Pulmonary/Chest: Effort normal and breath sounds normal.  Musculoskeletal: Normal range of motion.  Neurological: She is alert and oriented to person, place, and time.  Skin: No erythema.  Psychiatric: She has a normal mood and affect. Her behavior is normal. Judgment and thought content normal.    Results for orders placed or performed in visit on 03/08/17  Cologuard  Result Value  Ref Range   Cologuard Negative       Assessment & Plan:   Problem List Items Addressed This Visit      Cardiovascular and Mediastinum   Essential hypertension - Primary    The current medical regimen is effective;  continue present plan and medications.       Relevant Medications   ezetimibe (ZETIA) 10 MG tablet   Other Relevant Orders   Basic metabolic panel     Other   Hyperlipidemia    With continued elevated cholesterol we will add Zetia and avoid increasing her Pravachol dose because of history of elevated liver function      Relevant Medications   ezetimibe (ZETIA) 10 MG tablet   Other Relevant Orders   LP+ALT+AST Piccolo, Waived    Other Visit Diagnoses    Needs flu shot       Relevant Orders   Flu vaccine HIGH DOSE PF (Fluzone High dose) (Completed)   Need for pneumococcal vaccination       Relevant Orders   Pneumococcal polysaccharide vaccine 23-valent greater than or equal to 2yo subcutaneous/IM (Completed)       Follow up plan: Return in about 4 weeks (around 07/09/2017) for   Lipids, ALT, AST.

## 2017-06-11 NOTE — Assessment & Plan Note (Signed)
The current medical regimen is effective;  continue present plan and medications.  

## 2017-06-11 NOTE — Patient Instructions (Signed)

## 2017-06-12 ENCOUNTER — Encounter: Payer: Self-pay | Admitting: Family Medicine

## 2017-06-12 LAB — BASIC METABOLIC PANEL
BUN / CREAT RATIO: 14 (ref 12–28)
BUN: 12 mg/dL (ref 8–27)
CHLORIDE: 103 mmol/L (ref 96–106)
CO2: 23 mmol/L (ref 20–29)
Calcium: 9.7 mg/dL (ref 8.7–10.3)
Creatinine, Ser: 0.88 mg/dL (ref 0.57–1.00)
GFR calc non Af Amer: 69 mL/min/{1.73_m2} (ref >59)
GFR, EST AFRICAN AMERICAN: 80 mL/min/{1.73_m2} (ref >59)
Glucose: 80 mg/dL (ref 65–99)
POTASSIUM: 4.7 mmol/L (ref 3.5–5.2)
SODIUM: 141 mmol/L (ref 134–144)

## 2017-06-27 DIAGNOSIS — F331 Major depressive disorder, recurrent, moderate: Secondary | ICD-10-CM | POA: Diagnosis not present

## 2017-07-17 ENCOUNTER — Encounter (INDEPENDENT_AMBULATORY_CARE_PROVIDER_SITE_OTHER): Payer: Self-pay

## 2017-07-17 ENCOUNTER — Ambulatory Visit (INDEPENDENT_AMBULATORY_CARE_PROVIDER_SITE_OTHER): Payer: Medicare Other | Admitting: Family Medicine

## 2017-07-17 VITALS — BP 115/76 | HR 71 | Wt 162.0 lb

## 2017-07-17 DIAGNOSIS — E782 Mixed hyperlipidemia: Secondary | ICD-10-CM | POA: Diagnosis not present

## 2017-07-17 DIAGNOSIS — R5383 Other fatigue: Secondary | ICD-10-CM | POA: Insufficient documentation

## 2017-07-17 DIAGNOSIS — I10 Essential (primary) hypertension: Secondary | ICD-10-CM

## 2017-07-17 LAB — LP+ALT+AST PICCOLO, WAIVED
ALT (SGPT) PICCOLO, WAIVED: 32 U/L (ref 10–47)
AST (SGOT) Piccolo, Waived: 29 U/L (ref 11–38)
CHOLESTEROL PICCOLO, WAIVED: 192 mg/dL (ref <200)
Chol/HDL Ratio Piccolo,Waive: 3.6 mg/dL
HDL CHOL PICCOLO, WAIVED: 53 mg/dL — AB (ref >59)
LDL Chol Calc Piccolo Waived: 112 mg/dL — ABNORMAL HIGH (ref <100)
TRIGLYCERIDES PICCOLO,WAIVED: 132 mg/dL (ref <150)
VLDL Chol Calc Piccolo,Waive: 26 mg/dL (ref <30)

## 2017-07-17 MED ORDER — EZETIMIBE 10 MG PO TABS: 10.0000 mg | ORAL_TABLET | Freq: Every day | ORAL | 2 refills | Status: DC

## 2017-07-17 NOTE — Assessment & Plan Note (Signed)
The current medical regimen is effective;  continue present plan and medications.  

## 2017-07-17 NOTE — Assessment & Plan Note (Signed)
Discussed with fatigue with patient will try to check B12 also order sleep study as patient has multiple sleep apnea type symptoms.

## 2017-07-17 NOTE — Progress Notes (Addendum)
BP 115/76   Pulse 71   Wt 162 lb (73.5 kg)   SpO2 95%   BMI 25.65 kg/m    Subjective:    Patient ID: Michelle Espinoza, female    DOB: 1950-10-26, 67 y.o.   MRN: [id number]  HPI: Michelle Espinoza is a 67 y.o. female  Patient follow-up cholesterol and elevated liver enzymes in the past with cholesterol medication with low-dose pravastatin and Zetia patient doing well with no complaints. Patient has had some issues with fatigue has had a B12 ordered but has not had it drawn yet. Addendum done as just find  Medicare not excepting checklists. Information addendum was discussed with the patient and not just from a checklists. Relevant past medical, surgical, family and social history reviewed and updated as indicated. Interim medical history since our last visit reviewed. Allergies and medications reviewed and updated.  Review of Systems  Constitutional: Negative.   Respiratory: Negative.   Cardiovascular: Negative.     Per HPI unless specifically indicated above     Objective:    BP 115/76   Pulse 71   Wt 162 lb (73.5 kg)   SpO2 95%   BMI 25.65 kg/m   Wt Readings from Last 3 Encounters:  07/17/17 162 lb (73.5 kg)  06/11/17 168 lb (76.2 kg)  02/27/17 168 lb 9 oz (76.5 kg)    Physical Exam  Constitutional: She is oriented to person, place, and time. She appears well-developed and well-nourished.  HENT:  Head: Normocephalic and atraumatic.  Eyes: Conjunctivae and EOM are normal.  Neck: Normal range of motion.  Cardiovascular: Normal rate, regular rhythm and normal heart sounds.  Pulmonary/Chest: Effort normal and breath sounds normal.  Musculoskeletal: Normal range of motion.  Neurological: She is alert and oriented to person, place, and time.  Skin: No erythema.  Psychiatric: She has a normal mood and affect. Her behavior is normal. Judgment and thought content normal.    Results for orders placed or performed in visit on 78/24/23  Basic metabolic panel  Result Value  Ref Range   Glucose 80 65 - 99 mg/dL   BUN 12 8 - 27 mg/dL   Creatinine, Ser 0.88 0.57 - 1.00 mg/dL   GFR calc non Af Amer 69 >59 mL/min/1.73   GFR calc Af Amer 80 >59 mL/min/1.73   BUN/Creatinine Ratio 14 12 - 28   Sodium 141 134 - 144 mmol/L   Potassium 4.7 3.5 - 5.2 mmol/L   Chloride 103 96 - 106 mmol/L   CO2 23 20 - 29 mmol/L   Calcium 9.7 8.7 - 10.3 mg/dL  LP+ALT+AST Piccolo, Waived  Result Value Ref Range   ALT (SGPT) Piccolo, Waived 17 10 - 47 U/L   AST (SGOT) Piccolo, Waived 20 11 - 38 U/L   Cholesterol Piccolo, Waived 235 (H) <200 mg/dL   HDL Chol Piccolo, Waived 57 (L) >59 mg/dL   Triglycerides Piccolo,Waived 156 (H) <150 mg/dL   Chol/HDL Ratio Piccolo,Waive 4.1 mg/dL   LDL Chol Calc Piccolo Waived 147 (H) <100 mg/dL   VLDL Chol Calc Piccolo,Waive 31 (H) <30 mg/dL      Assessment & Plan:   Problem List Items Addressed This Visit      Cardiovascular and Mediastinum   Essential hypertension    The current medical regimen is effective;  continue present plan and medications.       Relevant Medications   ezetimibe (ZETIA) 10 MG tablet     Other   Hyperlipidemia -  Primary    The current medical regimen is effective;  continue present plan and medications.       Relevant Medications   ezetimibe (ZETIA) 10 MG tablet   Other Relevant Orders   LP+ALT+AST Piccolo, Waived   Fatigue    Discussed with fatigue with patient will try to check B12 also order sleep study as patient has multiple sleep apnea type symptoms.        the patient's sleep apnea type symptoms include  Witnessed apnea snoring which is been ongoing for years but getting worse over the last months.  Patient does have day time drowsiness morning headaches. Has had problems with depression and anxiety. The patient does have problems with hypertension fatigue and depression. Follow up plan: Return in about 6 months (around 01/14/2018) for Physical Exam.

## 2017-07-18 ENCOUNTER — Encounter: Payer: Self-pay | Admitting: Family Medicine

## 2017-07-24 DIAGNOSIS — G4730 Sleep apnea, unspecified: Secondary | ICD-10-CM | POA: Diagnosis not present

## 2017-10-23 DIAGNOSIS — F3340 Major depressive disorder, recurrent, in remission, unspecified: Secondary | ICD-10-CM | POA: Diagnosis not present

## 2018-01-18 ENCOUNTER — Telehealth: Payer: Self-pay | Admitting: *Deleted

## 2018-01-18 ENCOUNTER — Ambulatory Visit (INDEPENDENT_AMBULATORY_CARE_PROVIDER_SITE_OTHER): Payer: Medicare Other

## 2018-01-18 VITALS — BP 122/84 | HR 84 | Temp 98.2°F | Resp 16 | Ht 64.0 in | Wt 167.4 lb

## 2018-01-18 DIAGNOSIS — R5383 Other fatigue: Secondary | ICD-10-CM | POA: Diagnosis not present

## 2018-01-18 DIAGNOSIS — Z0000 Encounter for general adult medical examination without abnormal findings: Secondary | ICD-10-CM | POA: Diagnosis not present

## 2018-01-18 DIAGNOSIS — Z87891 Personal history of nicotine dependence: Secondary | ICD-10-CM

## 2018-01-18 DIAGNOSIS — I10 Essential (primary) hypertension: Secondary | ICD-10-CM

## 2018-01-18 DIAGNOSIS — E782 Mixed hyperlipidemia: Secondary | ICD-10-CM

## 2018-01-18 DIAGNOSIS — Z122 Encounter for screening for malignant neoplasm of respiratory organs: Secondary | ICD-10-CM

## 2018-01-18 LAB — URINALYSIS, ROUTINE W REFLEX MICROSCOPIC
Bilirubin, UA: NEGATIVE
GLUCOSE, UA: NEGATIVE
KETONES UA: NEGATIVE
LEUKOCYTES UA: NEGATIVE
NITRITE UA: NEGATIVE
Protein, UA: NEGATIVE
SPEC GRAV UA: 1.02 (ref 1.005–1.030)
Urobilinogen, Ur: 0.2 mg/dL (ref 0.2–1.0)
pH, UA: 5.5 (ref 5.0–7.5)

## 2018-01-18 LAB — MICROSCOPIC EXAMINATION

## 2018-01-18 NOTE — Telephone Encounter (Signed)
Received referral for initial lung cancer screening scan. Contacted patient and obtained smoking history,(current, 49 pack year) as well as answering questions related to screening process. Patient denies signs of lung cancer such as weight loss or hemoptysis. Patient denies comorbidity that would prevent curative treatment if lung cancer were found. Patient is scheduled for shared decision making visit and CT scan on 02/12/18.

## 2018-01-18 NOTE — Progress Notes (Signed)
Subjective:   Michelle Espinoza is a 67 y.o. female who presents for an Initial Medicare Annual Wellness Visit.  Review of Systems      Cardiac Risk Factors include: advanced age (>56men, >15 women);hypertension;dyslipidemia;smoking/ tobacco exposure     Objective:    Today's Vitals   01/18/18 0851  BP: 122/84  Pulse: 84  Resp: 16  Temp: 98.2 F (36.8 C)  TempSrc: Temporal  Weight: 167 lb 6.4 oz (75.9 kg)  Height: 5\' 4"  (1.626 m)   Body mass index is 28.73 kg/m.  Advanced Directives 01/18/2018  Does Patient Have a Medical Advance Directive? No  Would patient like information on creating a medical advance directive? Yes (MAU/Ambulatory/Procedural Areas - Information given)    Current Medications (verified) Outpatient Encounter Medications as of 01/18/2018  Medication Sig  . aspirin EC 81 MG tablet Take 81 mg by mouth daily.  Marland Kitchen ezetimibe (ZETIA) 10 MG tablet Take 1 tablet (10 mg total) by mouth daily.  Marland Kitchen FLUoxetine (PROZAC) 20 MG capsule Take 3 capsules (60 mg total) by mouth daily.  Marland Kitchen losartan-hydrochlorothiazide (HYZAAR) 100-12.5 MG tablet Take 1 tablet by mouth daily.  . pravastatin (PRAVACHOL) 20 MG tablet Take 1 tablet (20 mg total) by mouth daily.   No facility-administered encounter medications on file as of 01/18/2018.     Allergies (verified) Lovastatin and Penicillins   History: Past Medical History:  Diagnosis Date  . Allergy   . Depression   . Hyperlipidemia    Past Surgical History:  Procedure Laterality Date  . ABDOMINAL HYSTERECTOMY     Family History  Adopted: Yes  Family history unknown: Yes   Social History   Socioeconomic History  . Marital status: Widowed    Spouse name: Not on file  . Number of children: Not on file  . Years of education: 56  . Highest education level: High school graduate  Occupational History  . Not on file  Social Needs  . Financial resource strain: Not hard at all  . Food insecurity:    Worry: Never true      Inability: Never true  . Transportation needs:    Medical: No    Non-medical: No  Tobacco Use  . Smoking status: Current Every Day Smoker    Packs/day: 1.00    Types: Cigarettes  . Smokeless tobacco: Never Used  Substance and Sexual Activity  . Alcohol use: Yes    Comment: occasionally  . Drug use: No  . Sexual activity: Not on file  Lifestyle  . Physical activity:    Days per week: 0 days    Minutes per session: 0 min  . Stress: Not at all  Relationships  . Social connections:    Talks on phone: More than three times a week    Gets together: More than three times a week    Attends religious service: More than 4 times per year    Active member of club or organization: Yes    Attends meetings of clubs or organizations: More than 4 times per year    Relationship status: Widowed  Other Topics Concern  . Not on file  Social History Narrative  . Not on file    Tobacco Counseling Ready to quit: No Counseling given: Yes   Clinical Intake:  Pre-visit preparation completed: Yes  Pain : No/denies pain     Nutritional Status: BMI 25 -29 Overweight Nutritional Risks: None Diabetes: No  How often do you need to have someone help  you when you read instructions, pamphlets, or other written materials from your doctor or pharmacy?: 1 - Never What is the last grade level you completed in school?: 12th grade  Interpreter Needed?: No  Information entered by :: Kensley Lares,LPN   Activities of Daily Living In your present state of health, do you have any difficulty performing the following activities: 01/18/2018  Hearing? N  Vision? N  Difficulty concentrating or making decisions? N  Walking or climbing stairs? N  Dressing or bathing? N  Doing errands, shopping? N  Preparing Food and eating ? N  Using the Toilet? N  In the past six months, have you accidently leaked urine? N  Do you have problems with loss of bowel control? N  Managing your Medications? N   Managing your Finances? N  Housekeeping or managing your Housekeeping? N  Some recent data might be hidden     Immunizations and Health Maintenance Immunization History  Administered Date(s) Administered  . Influenza, High Dose Seasonal PF 06/11/2017  . Influenza,inj,Quad PF,6+ Mos 06/17/2015, 05/29/2016  . Pneumococcal Polysaccharide-23 06/11/2017  . Td 12/13/2006, 01/09/2017  . Zoster 07/07/2013   There are no preventive care reminders to display for this patient.  Patient Care Team: Guadalupe Maple, MD as PCP - General (Family Medicine) Chauncey Mann, MD as Referring Physician (Psychiatry)  Indicate any recent Medical Services you may have received from other than [facility] providers in the past year (date may be approximate).     Assessment:   This is a routine wellness examination for Michelle Espinoza.  Hearing/Vision screen Vision Screening Comments: [facility]es [facility] 2 years   Dietary issues and exercise activities discussed: Current Exercise Habits: Structured exercise class, Type of exercise: strength training/weights, Time (Minutes): 40, Frequency (Times/Week): 3, Weekly Exercise (Minutes/Week): 120, Intensity: Mild, Exercise limited by: None identified  Goals    . Quit Smoking     Smoking cessation discussed      Depression Screen PHQ 2/9 Scores 01/18/2018 07/17/2017 01/09/2017 05/29/2016 11/16/2015  PHQ - 2 Score 0 0 0 0 0  PHQ- 9 Score - - - - 0    Fall Risk Fall Risk  01/18/2018 07/17/2017 01/09/2017  Falls in the past year? No No No    Is the patient's home free of loose throw rugs in walkways, pet beds, electrical cords, etc?   yes      Grab bars in the bathroom? no      Handrails on the stairs?   no stairs      Adequate lighting?   yes  Timed Get Up and Go Performed Completed in 8 seconds with no use of assistive devices, steady gait. No intervention needed at this time.   Cognitive Function:     6CIT Screen 01/18/2018  What Year? 0 points  What month? 0  points  What time? 0 points  Count back from 20 0 points  Months in reverse 0 points  Repeat phrase 0 points  Total Score 0    Screening Tests Health Maintenance  Topic Date Due  . INFLUENZA VACCINE  02/07/2018  . PNA vac Low Risk Adult (2 of 2 - PCV13) 06/11/2018  . MAMMOGRAM  04/10/2019  . Fecal DNA (Cologuard)  03/07/2020  . TETANUS/TDAP  01/10/2027  . DEXA SCAN  Completed  . Hepatitis C Screening  Completed    Qualifies for Shingles Vaccine? Yes, discussed shingrix vaccine   Cancer Screenings: Lung: Low Dose CT Chest recommended if Age 42-80  years, 30 pack-year currently smoking OR have quit w/in 15years. Patient does qualify. Will send message to Burgess Estelle, RN - Oncology Navigator  Breast: Up to date on Mammogram? Yes   04/09/2017 Up to date of Bone Density/Dexa? Yes 04/09/2017 Colorectal:cologuard completed 03/07/2017  Additional Screenings:  Hepatitis C Screening: completed 11/16/2015     Plan:    I have personally reviewed and addressed the Medicare Annual Wellness questionnaire and have noted the following in the patient's chart:  A. Medical and social history B. Use of alcohol, tobacco or illicit drugs  C. Current medications and supplements D. Functional ability and status E.  Nutritional status F.  Physical activity G. Advance directives H. List of other physicians I.  Hospitalizations, surgeries, and ER visits in previous 12 months J.  [place] such as hearing and vision if needed, cognitive and depression L. Referrals and appointments   In addition, I have reviewed and discussed with patient certain preventive protocols, quality metrics, and best practice recommendations. A written personalized care plan for preventive services as well as general preventive health recommendations were provided to patient.   Signed,  Tyler Aas, LPN Nurse Health Advisor   Nurse Notes:Patient would like to go over sleep study results with PCP, has  appointment on 01/22/2018 with Dr.Crissman.

## 2018-01-18 NOTE — Patient Instructions (Signed)
[name] , Thank you for taking time to come for your Medicare Wellness Visit. I appreciate your ongoing commitment to your health goals. Please review the following plan we discussed and let me know if I can assist you in the future.   Screening recommendations/referrals: Colonoscopy: cologuard completed 03/07/2017 Mammogram: completed 04/09/2017 Bone Density: completed 04/09/2017 Recommended yearly ophthalmology/optometry visit for glaucoma screening and checkup Recommended yearly dental visit for hygiene and checkup  Vaccinations: Influenza vaccine: due 03/2018 Pneumococcal vaccine: due 06/11/2018 Tdap vaccine: up to date Shingles vaccine: shingrix eligible, check with your insurance company for coverage     Advanced directives: Advance directive discussed with you today. I have provided a copy for you to complete at home and have notarized. Once this is complete please bring a copy in to our office so we can scan it into your chart.  Conditions/risks identified: smoking cessation discussed  Next appointment: Follow up on 01/22/2018 at 9:00am with Dr.Crissman. Follow up in one year for your annual wellness exam.    Preventive Care 65 Years and Older, Female Preventive care refers to lifestyle choices and visits with your health care provider that can promote health and wellness. What does preventive care include?  A yearly physical exam. This is also called an annual well check.  Dental exams once or twice a year.  Routine eye exams. Ask your health care provider how often you should have your eyes checked.  Personal lifestyle choices, including:  Daily care of your teeth and gums.  Regular physical activity.  Eating a healthy diet.  Avoiding tobacco and drug use.  Limiting alcohol use.  Practicing safe sex.  Taking low-dose aspirin every day.  Taking vitamin and mineral supplements as recommended by your health care provider. What happens during an annual well  check? The services and screenings done by your health care provider during your annual well check will depend on your age, overall health, lifestyle risk factors, and family history of disease. Counseling  Your health care provider may ask you questions about your:  Alcohol use.  Tobacco use.  Drug use.  Emotional well-being.  Home and relationship well-being.  Sexual activity.  Eating habits.  History of falls.  Memory and ability to understand (cognition).  Work and work Statistician.  Reproductive health. Screening  You may have the following tests or measurements:  Height, weight, and BMI.  Blood pressure.  Lipid and cholesterol levels. These may be checked every 5 years, or more frequently if you are over 68 years old.  Skin check.  Lung cancer screening. You may have this screening every year starting at age 35 if you have a 30-pack-year history of smoking and currently smoke or have quit within the past 15 years.  Fecal occult blood test (FOBT) of the stool. You may have this test every year starting at age 13.  Flexible sigmoidoscopy or colonoscopy. You may have a sigmoidoscopy every 5 years or a colonoscopy every 10 years starting at age 30.  Hepatitis C blood test.  Hepatitis B blood test.  Sexually transmitted disease (STD) testing.  Diabetes screening. This is done by checking your blood sugar (glucose) after you have not eaten for a while (fasting). You may have this done every 1-3 years.  Bone density scan. This is done to screen for osteoporosis. You may have this done starting at age 71.  Mammogram. This may be done every 1-2 years. Talk to your health care provider about how often you should have regular  mammograms. Talk with your health care provider about your test results, treatment options, and if necessary, the need for more tests. Vaccines  Your health care provider may recommend certain vaccines, such as:  Influenza vaccine. This is  recommended every year.  Tetanus, diphtheria, and acellular pertussis (Tdap, Td) vaccine. You may need a Td booster every 10 years.  Zoster vaccine. You may need this after age 62.  Pneumococcal 13-valent conjugate (PCV13) vaccine. One dose is recommended after age 65.  Pneumococcal polysaccharide (PPSV23) vaccine. One dose is recommended after age 82. Talk to your health care provider about which screenings and vaccines you need and how often you need them. This information is not intended to replace advice given to you by your health care provider. Make sure you discuss any questions you have with your health care provider. Document Released: 07/23/2015 Document Revised: 03/15/2016 Document Reviewed: 04/27/2015 Elsevier Interactive Patient Education  2017 [employer] Prevention in the Home Falls can cause injuries. They can happen to people of all ages. There are many things you can do to make your home safe and to help prevent falls. What can I do on the outside of my home?  Regularly fix the edges of walkways and driveways and fix any cracks.  Remove anything that might make you trip as you walk through a door, such as a raised step or threshold.  Trim any bushes or trees on the path to your home.  Use bright outdoor lighting.  Clear any walking paths of anything that might make someone trip, such as rocks or tools.  Regularly check to see if handrails are loose or broken. Make sure that both sides of any steps have handrails.  Any raised decks and porches should have guardrails on the edges.  Have any leaves, snow, or ice cleared regularly.  Use sand or salt on walking paths during winter.  Clean up any spills in your garage right away. This includes oil or grease spills. What can I do in the bathroom?  Use night lights.  Install grab bars by the toilet and in the tub and shower. Do not use towel bars as grab bars.  Use non-skid mats or decals in the tub or  shower.  If you need to sit down in the shower, use a plastic, non-slip stool.  Keep the floor dry. Clean up any water that spills on the floor as soon as it happens.  Remove soap buildup in the tub or shower regularly.  Attach bath mats securely with double-sided non-slip rug tape.  Do not have throw rugs and other things on the floor that can make you trip. What can I do in the bedroom?  Use night lights.  Make sure that you have a light by your bed that is easy to reach.  Do not use any sheets or blankets that are too big for your bed. They should not hang down onto the floor.  Have a firm chair that has side arms. You can use this for support while you get dressed.  Do not have throw rugs and other things on the floor that can make you trip. What can I do in the kitchen?  Clean up any spills right away.  Avoid walking on wet floors.  Keep items that you use a lot in easy-to-reach places.  If you need to reach something above you, use a strong step stool that has a grab bar.  Keep electrical cords out of the way.  Do not use floor polish or wax that makes floors slippery. If you must use wax, use non-skid floor wax.  Do not have throw rugs and other things on the floor that can make you trip. What can I do with my stairs?  Do not leave any items on the stairs.  Make sure that there are handrails on both sides of the stairs and use them. Fix handrails that are broken or loose. Make sure that handrails are as long as the stairways.  Check any carpeting to make sure that it is firmly attached to the stairs. Fix any carpet that is loose or worn.  Avoid having throw rugs at the top or bottom of the stairs. If you do have throw rugs, attach them to the floor with carpet tape.  Make sure that you have a light switch at the top of the stairs and the bottom of the stairs. If you do not have them, ask someone to add them for you. What else can I do to help prevent  falls?  Wear shoes that:  Do not have high heels.  Have rubber bottoms.  Are comfortable and fit you well.  Are closed at the toe. Do not wear sandals.  If you use a stepladder:  Make sure that it is fully opened. Do not climb a closed stepladder.  Make sure that both sides of the stepladder are locked into place.  Ask someone to hold it for you, if possible.  Clearly mark and make sure that you can see:  Any grab bars or handrails.  First and last steps.  Where the edge of each step is.  Use tools that help you move around (mobility aids) if they are needed. These include:  Canes.  Walkers.  Scooters.  Crutches.  Turn on the lights when you go into a dark area. Replace any light bulbs as soon as they burn out.  Set up your furniture so you have a clear path. Avoid moving your furniture around.  If any of your floors are uneven, fix them.  If there are any pets around you, be aware of where they are.  Review your medicines with your doctor. Some medicines can make you feel dizzy. This can increase your chance of falling. Ask your doctor what other things that you can do to help prevent falls. This information is not intended to replace advice given to you by your health care provider. Make sure you discuss any questions you have with your health care provider. Document Released: 04/22/2009 Document Revised: 12/02/2015 Document Reviewed: 07/31/2014 Elsevier Interactive Patient Education  2017 [employer].

## 2018-01-19 LAB — CBC WITH DIFFERENTIAL/PLATELET
BASOS ABS: 0.1 10*3/uL (ref 0.0–0.2)
Basos: 1 %
EOS (ABSOLUTE): 0.4 10*3/uL (ref 0.0–0.4)
Eos: 6 %
Hematocrit: 41.2 % (ref 34.0–46.6)
Hemoglobin: 13.6 g/dL (ref 11.1–15.9)
IMMATURE GRANS (ABS): 0 10*3/uL (ref 0.0–0.1)
IMMATURE GRANULOCYTES: 0 %
LYMPHS: 24 %
Lymphocytes Absolute: 1.8 10*3/uL (ref 0.7–3.1)
MCH: 30.6 pg (ref 26.6–33.0)
MCHC: 33 g/dL (ref 31.5–35.7)
MCV: 93 fL (ref 79–97)
MONOS ABS: 0.5 10*3/uL (ref 0.1–0.9)
Monocytes: 7 %
NEUTROS PCT: 62 %
Neutrophils Absolute: 4.7 10*3/uL (ref 1.4–7.0)
PLATELETS: 298 10*3/uL (ref 150–450)
RBC: 4.44 x10E6/uL (ref 3.77–5.28)
RDW: 13.8 % (ref 12.3–15.4)
WBC: 7.5 10*3/uL (ref 3.4–10.8)

## 2018-01-19 LAB — COMPREHENSIVE METABOLIC PANEL
ALT: 16 IU/L (ref 0–32)
AST: 19 IU/L (ref 0–40)
Albumin/Globulin Ratio: 1.4 (ref 1.2–2.2)
Albumin: 4 g/dL (ref 3.6–4.8)
Alkaline Phosphatase: 68 IU/L (ref 39–117)
BUN/Creatinine Ratio: 21 (ref 12–28)
BUN: 16 mg/dL (ref 8–27)
Bilirubin Total: 0.3 mg/dL (ref 0.0–1.2)
CALCIUM: 9.7 mg/dL (ref 8.7–10.3)
CHLORIDE: 107 mmol/L — AB (ref 96–106)
CO2: 23 mmol/L (ref 20–29)
Creatinine, Ser: 0.76 mg/dL (ref 0.57–1.00)
GFR, EST AFRICAN AMERICAN: 95 mL/min/{1.73_m2} (ref >59)
GFR, EST NON AFRICAN AMERICAN: 82 mL/min/{1.73_m2} (ref >59)
Globulin, Total: 2.8 g/dL (ref 1.5–4.5)
Glucose: 84 mg/dL (ref 65–99)
POTASSIUM: 5.6 mmol/L — AB (ref 3.5–5.2)
Sodium: 142 mmol/L (ref 134–144)
TOTAL PROTEIN: 6.8 g/dL (ref 6.0–8.5)

## 2018-01-19 LAB — LIPID PANEL W/O CHOL/HDL RATIO
Cholesterol, Total: 168 mg/dL (ref 100–199)
HDL: 41 mg/dL (ref >39)
LDL Calculated: 93 mg/dL (ref 0–99)
TRIGLYCERIDES: 170 mg/dL — AB (ref 0–149)
VLDL CHOLESTEROL CAL: 34 mg/dL (ref 5–40)

## 2018-01-19 LAB — TSH: TSH: 3.51 u[IU]/mL (ref 0.450–4.500)

## 2018-01-22 ENCOUNTER — Ambulatory Visit (INDEPENDENT_AMBULATORY_CARE_PROVIDER_SITE_OTHER): Payer: Medicare Other | Admitting: Family Medicine

## 2018-01-22 VITALS — BP 139/97 | HR 75 | Ht 64.0 in | Wt 170.0 lb

## 2018-01-22 DIAGNOSIS — E782 Mixed hyperlipidemia: Secondary | ICD-10-CM

## 2018-01-22 DIAGNOSIS — F3342 Major depressive disorder, recurrent, in full remission: Secondary | ICD-10-CM

## 2018-01-22 DIAGNOSIS — I10 Essential (primary) hypertension: Secondary | ICD-10-CM

## 2018-01-22 DIAGNOSIS — G2581 Restless legs syndrome: Secondary | ICD-10-CM | POA: Diagnosis not present

## 2018-01-22 DIAGNOSIS — Z7189 Other specified counseling: Secondary | ICD-10-CM | POA: Diagnosis not present

## 2018-01-22 DIAGNOSIS — L723 Sebaceous cyst: Secondary | ICD-10-CM | POA: Diagnosis not present

## 2018-01-22 MED ORDER — EZETIMIBE 10 MG PO TABS: 10.0000 mg | ORAL_TABLET | Freq: Every day | ORAL | 4 refills | Status: DC

## 2018-01-22 MED ORDER — LOSARTAN POTASSIUM-HCTZ 100-12.5 MG PO TABS: 1.0000 | ORAL_TABLET | Freq: Every day | ORAL | 4 refills | Status: DC

## 2018-01-22 MED ORDER — PRAVASTATIN SODIUM 20 MG PO TABS: 20.0000 mg | ORAL_TABLET | Freq: Every day | ORAL | 4 refills | Status: DC

## 2018-01-22 MED ORDER — GABAPENTIN 300 MG PO CAPS: 300.0000 mg | ORAL_CAPSULE | Freq: Every day | ORAL | 2 refills | Status: DC

## 2018-01-22 NOTE — Assessment & Plan Note (Signed)
Followed by psychiatry and stable 

## 2018-01-22 NOTE — Progress Notes (Signed)
Good morning good morning Michelle Espinoza spinal posterior and so   BP (!) 139/97   Pulse 75   Ht _0  (1.626 m)   Wt 170 lb (77.1 kg)   SpO2 97%   BMI 29.18 kg/m    Subjective:    Patient ID: Michelle Espinoza, female    DOB: 1950/10/16, 67 y.o.   MRN: [id number]  HPI: Michelle Espinoza is a 67 y.o. female  Chief Complaint  Patient presents with  . Annual Exam    NO CPE! Saw Michelle Espinoza  Patient all in all doing well no complaints taking blood pressure medications without problems or issues. Cholesterol also doing well with protocols yet. Taking medications faithfully. Taking fluoxetine from Dr. Nicolasa Ducking was on 30 mg and is been able to go back to 20 mg without any loss of control. Also some vaginal area itching more superficial just use some over-the-counter cream this morning may be is helping some no known changes or irritants. Patient also had sleep study back this winter with no sleep apnea identified but marked restless legs has not had any treatment for restless legs still having some sleep interruption which is secondary to restless legs.  Is ready to start treatment.  Relevant past medical, surgical, family and social history reviewed and updated as indicated. Interim medical history since our last visit reviewed. Allergies and medications reviewed and updated.  Review of Systems  Constitutional: Negative.   HENT: Negative.   Eyes: Negative.   Respiratory: Negative.   Cardiovascular: Negative.   Gastrointestinal: Negative.   Endocrine: Negative.   Genitourinary: Negative.   Musculoskeletal: Negative.   Skin: Negative.   Allergic/Immunologic: Negative.   Neurological: Negative.   Hematological: Negative.   Psychiatric/Behavioral: Negative.     Per HPI unless specifically indicated above     Objective:    BP (!) 139/97   Pulse 75   Ht _1  (1.626 m)   Wt 170 lb (77.1 kg)   SpO2 97%   BMI 29.18 kg/m   Wt Readings from Last 3 Encounters:  01/22/18 170 lb (77.1 kg)    01/18/18 167 lb 6.4 oz (75.9 kg)  07/17/17 162 lb (73.5 kg)    Physical Exam  Results for orders placed or performed in visit on 01/18/18  Microscopic Examination  Result Value Ref Range   WBC, UA 0-5 0 - 5 /hpf   RBC, UA 3-10 (A) 0 - 2 /hpf   Epithelial Cells (non renal) 0-10 0 - 10 /hpf   Mucus, UA Present Not Estab.   Bacteria, UA Few None seen/Few  CBC with Differential  Result Value Ref Range   WBC 7.5 3.4 - 10.8 x10E3/uL   RBC 4.44 3.77 - 5.28 x10E6/uL   Hemoglobin 13.6 11.1 - 15.9 g/dL   Hematocrit 41.2 34.0 - 46.6 %   MCV 93 79 - 97 fL   MCH 30.6 26.6 - 33.0 pg   MCHC 33.0 31.5 - 35.7 g/dL   RDW 13.8 12.3 - 15.4 %   Platelets 298 150 - 450 x10E3/uL   Neutrophils 62 Not Estab. %   Lymphs 24 Not Estab. %   Monocytes 7 Not Estab. %   Eos 6 Not Estab. %   Basos 1 Not Estab. %   Neutrophils Absolute 4.7 1.4 - 7.0 x10E3/uL   Lymphocytes Absolute 1.8 0.7 - 3.1 x10E3/uL   Monocytes Absolute 0.5 0.1 - 0.9 x10E3/uL   EOS (ABSOLUTE) 0.4 0.0 - 0.4 x10E3/uL   Basophils Absolute 0.1  0.0 - 0.2 x10E3/uL   Immature Granulocytes 0 Not Estab. %   Immature Grans (Abs) 0.0 0.0 - 0.1 x10E3/uL  Comp Met (CMET)  Result Value Ref Range   Glucose 84 65 - 99 mg/dL   BUN 16 8 - 27 mg/dL   Creatinine, Ser 0.76 0.57 - 1.00 mg/dL   GFR calc non Af Amer 82 >59 mL/min/1.73   GFR calc Af Amer 95 >59 mL/min/1.73   BUN/Creatinine Ratio 21 12 - 28   Sodium 142 134 - 144 mmol/L   Potassium 5.6 (H) 3.5 - 5.2 mmol/L   Chloride 107 (H) 96 - 106 mmol/L   CO2 23 20 - 29 mmol/L   Calcium 9.7 8.7 - 10.3 mg/dL   Total Protein 6.8 6.0 - 8.5 g/dL   Albumin 4.0 3.6 - 4.8 g/dL   Globulin, Total 2.8 1.5 - 4.5 g/dL   Albumin/Globulin Ratio 1.4 1.2 - 2.2   Bilirubin Total 0.3 0.0 - 1.2 mg/dL   Alkaline Phosphatase 68 39 - 117 IU/L   AST 19 0 - 40 IU/L   ALT 16 0 - 32 IU/L  Lipid Panel w/o Chol/HDL Ratio  Result Value Ref Range   Cholesterol, Total 168 100 - 199 mg/dL   Triglycerides 170 (H) 0 - 149  mg/dL   HDL 41 >39 mg/dL   VLDL Cholesterol Cal 34 5 - 40 mg/dL   LDL Calculated 93 0 - 99 mg/dL  TSH  Result Value Ref Range   TSH 3.510 0.450 - 4.500 uIU/mL  Urinalysis, Routine w reflex microscopic  Result Value Ref Range   Specific Gravity, UA 1.020 1.005 - 1.030   pH, UA 5.5 5.0 - 7.5   Color, UA Orange Yellow   Appearance Ur Hazy (A) Clear   Leukocytes, UA Negative Negative   Protein, UA Negative Negative/Trace   Glucose, UA Negative Negative   Ketones, UA Negative Negative   RBC, UA 3+ (A) Negative   Bilirubin, UA Negative Negative   Urobilinogen, Ur 0.2 0.2 - 1.0 mg/dL   Nitrite, UA Negative Negative   Microscopic Examination See below:       Assessment & Plan:   Problem List Items Addressed This Visit      Cardiovascular and Mediastinum   Essential hypertension    Blood pressure up here today but otherwise on review blood pressures consistently been normal will leave alone and observe blood pressure and if not coming down will reevaluate.      Relevant Medications   pravastatin (PRAVACHOL) 20 MG tablet   losartan-hydrochlorothiazide (HYZAAR) 100-12.5 MG tablet   ezetimibe (ZETIA) 10 MG tablet     Musculoskeletal and Integument   Sebaceous cyst    Patient with sebaceous cyst between breasts wants removed as becoming a little more irritated and bothersome will refer to dermatology to further evaluate.      Relevant Orders   Ambulatory referral to Dermatology     Other   Hyperlipidemia    The current medical regimen is effective;  continue present plan and medications.       Relevant Medications   pravastatin (PRAVACHOL) 20 MG tablet   losartan-hydrochlorothiazide (HYZAAR) 100-12.5 MG tablet   ezetimibe (ZETIA) 10 MG tablet   Depression    Followed by psychiatry and stable      Restless legs    Discussed restless legs care and treatment will start with gabapentin 300 at bedtime for a week may increase to 600 at bedtime for a week and may  go ahead and  increase to 300 in the morning and 600 at bedtime.  Patient will notify us if not doing well if doing well will otherwise check in 6 months.          Follow up plan: Return in about 6 months (around 07/25/2018) for BMP,  Lipids, ALT, AST.

## 2018-01-22 NOTE — Assessment & Plan Note (Signed)
The current medical regimen is effective;  continue present plan and medications.  

## 2018-01-22 NOTE — Assessment & Plan Note (Signed)
Discussed restless legs care and treatment will start with gabapentin 300 at bedtime for a week may increase to 600 at bedtime for a week and may go ahead and increase to 300 in the morning and 600 at bedtime.  Patient will notify us if not doing well if doing well will otherwise check in 6 months.

## 2018-01-22 NOTE — Assessment & Plan Note (Signed)
A voluntary discussion about advanced care planning including explanation and discussion of advanced directives was extentively discussed with the patient.  Explained about the healthcare proxy and living will was reviewed and packet with forms with expiration of how to fill them out was given.  Time spent: Encounter 16+ min individuals present: Patient 

## 2018-01-22 NOTE — Assessment & Plan Note (Signed)
Patient with sebaceous cyst between breasts wants removed as becoming a little more irritated and bothersome will refer to dermatology to further evaluate.

## 2018-01-22 NOTE — Assessment & Plan Note (Signed)
Blood pressure up here today but otherwise on review blood pressures consistently been normal will leave alone and observe blood pressure and if not coming down will reevaluate.

## 2018-02-12 ENCOUNTER — Telehealth: Payer: Self-pay | Admitting: Nurse Practitioner

## 2018-02-12 ENCOUNTER — Ambulatory Visit: Payer: Medicare Other | Admitting: Oncology

## 2018-02-12 ENCOUNTER — Ambulatory Visit: Payer: Medicare Other

## 2018-02-14 ENCOUNTER — Ambulatory Visit
Admission: RE | Admit: 2018-02-14 | Discharge: 2018-02-14 | Disposition: A | Discharge status: Home / Self Care | Payer: Medicare Other | Source: Ambulatory Visit | Attending: Oncology | Admitting: Oncology

## 2018-02-14 ENCOUNTER — Encounter: Payer: Self-pay | Admitting: Nurse Practitioner

## 2018-02-14 ENCOUNTER — Inpatient Hospital Stay: Payer: Medicare Other | Attending: Nurse Practitioner | Admitting: Nurse Practitioner

## 2018-02-14 DIAGNOSIS — J479 Bronchiectasis, uncomplicated: Secondary | ICD-10-CM | POA: Insufficient documentation

## 2018-02-14 DIAGNOSIS — I2510 Atherosclerotic heart disease of native coronary artery without angina pectoris: Secondary | ICD-10-CM | POA: Diagnosis not present

## 2018-02-14 DIAGNOSIS — F17210 Nicotine dependence, cigarettes, uncomplicated: Secondary | ICD-10-CM | POA: Diagnosis not present

## 2018-02-14 DIAGNOSIS — J432 Centrilobular emphysema: Secondary | ICD-10-CM | POA: Insufficient documentation

## 2018-02-14 DIAGNOSIS — Z87891 Personal history of nicotine dependence: Secondary | ICD-10-CM

## 2018-02-14 DIAGNOSIS — I700 Atherosclerosis of aorta: Secondary | ICD-10-CM | POA: Diagnosis not present

## 2018-02-14 DIAGNOSIS — Z122 Encounter for screening for malignant neoplasm of respiratory organs: Secondary | ICD-10-CM

## 2018-02-14 DIAGNOSIS — K8020 Calculus of gallbladder without cholecystitis without obstruction: Secondary | ICD-10-CM | POA: Insufficient documentation

## 2018-02-14 NOTE — Progress Notes (Signed)
In accordance with CMS guidelines, patient has met eligibility criteria including age, absence of signs or symptoms of lung cancer.  Social History   Tobacco Use  . Smoking status: Current Every Day Smoker    Packs/day: 1.00    Years: 49.00    Pack years: 49.00    Types: Cigarettes  . Smokeless tobacco: Never Used  Substance Use Topics  . Alcohol use: Yes    Comment: occasionally  . Drug use: No      A shared decision-making session was conducted prior to the performance of CT scan. This includes one or more decision aids, includes benefits and harms of screening, follow-up diagnostic testing, over-diagnosis, false positive rate, and total radiation exposure.   Counseling on the importance of adherence to annual lung cancer LDCT screening, impact of co-morbidities, and ability or willingness to undergo diagnosis and treatment is imperative for compliance of the program.   Counseling on the importance of continued smoking cessation for former smokers; the importance of smoking cessation for current smokers, and information about tobacco cessation interventions have been given to patient including [place] and 1800 quit  programs.   Written order for lung cancer screening with LDCT has been given to the patient and any and all questions have been answered to the best of my abilities.    Yearly follow up will be coordinated by Burgess Estelle, Thoracic Navigator.  Beckey Rutter, DNP, AGNP-C [place] [facility] [phone] (work cell) [phone] (office) 02/14/18 4:11 PM

## 2018-02-15 ENCOUNTER — Encounter: Payer: Self-pay | Admitting: *Deleted

## 2018-02-27 DIAGNOSIS — L720 Epidermal cyst: Secondary | ICD-10-CM | POA: Diagnosis not present

## 2018-02-28 DIAGNOSIS — F3340 Major depressive disorder, recurrent, in remission, unspecified: Secondary | ICD-10-CM | POA: Diagnosis not present

## 2018-03-07 ENCOUNTER — Encounter: Payer: Self-pay | Admitting: Family Medicine

## 2018-03-07 ENCOUNTER — Ambulatory Visit (INDEPENDENT_AMBULATORY_CARE_PROVIDER_SITE_OTHER): Payer: Medicare Other | Admitting: Family Medicine

## 2018-03-07 VITALS — BP 120/79 | HR 82 | Temp 98.7°F | Ht 64.0 in | Wt 171.2 lb

## 2018-03-07 DIAGNOSIS — K8020 Calculus of gallbladder without cholecystitis without obstruction: Secondary | ICD-10-CM | POA: Diagnosis not present

## 2018-03-07 DIAGNOSIS — K8050 Calculus of bile duct without cholangitis or cholecystitis without obstruction: Secondary | ICD-10-CM | POA: Diagnosis not present

## 2018-03-07 DIAGNOSIS — K30 Functional dyspepsia: Secondary | ICD-10-CM

## 2018-03-07 DIAGNOSIS — R61 Generalized hyperhidrosis: Secondary | ICD-10-CM | POA: Diagnosis not present

## 2018-03-07 DIAGNOSIS — Z23 Encounter for immunization: Secondary | ICD-10-CM

## 2018-03-07 DIAGNOSIS — E875 Hyperkalemia: Secondary | ICD-10-CM

## 2018-03-07 MED ORDER — OMEPRAZOLE 20 MG PO CPDR: 20.0000 mg | DELAYED_RELEASE_CAPSULE | Freq: Every day | ORAL | 3 refills | Status: DC

## 2018-03-07 NOTE — Progress Notes (Signed)
BP 120/79   Pulse 82   Temp 98.7 F (37.1 C) (Oral)   Ht 5\' 4"  (1.626 m)   Wt 171 lb 3.2 oz (77.7 kg)   SpO2 97%   BMI 29.39 kg/m    Subjective:    Patient ID: Michelle Espinoza, female    DOB: 05/30/1951, 67 y.o.   MRN: [id number]  HPI: Michelle Espinoza is a 67 y.o. female  Chief Complaint  Patient presents with  . Pain    right side around breast x couple of years. pt states she thought it was indigestion but during liver check, was found that she has gallstones   Here today with chronic intermittent RUQ pain the past few years. Had a low dose screening chest CT recently which revealed a 2 cm gallstone in the neck of the gallbladder and she's wondering if that could be causing her sxs. Worse with eating heavier foods. Will have short episodes of minutes to an hour of sharp constant RUQ pain and full body sweating. Does also have some reflux sxs with burning up esophagus. Denies fevers, chills, CP, SOB.   Relevant past medical, surgical, family and social history reviewed and updated as indicated. Interim medical history since our last visit reviewed. Allergies and medications reviewed and updated.  Review of Systems  Per HPI unless specifically indicated above     Objective:    BP 120/79   Pulse 82   Temp 98.7 F (37.1 C) (Oral)   Ht 5\' 4"  (1.626 m)   Wt 171 lb 3.2 oz (77.7 kg)   SpO2 97%   BMI 29.39 kg/m   Wt Readings from Last 3 Encounters:  03/07/18 171 lb 3.2 oz (77.7 kg)  02/14/18 170 lb (77.1 kg)  01/22/18 170 lb (77.1 kg)    Physical Exam  Constitutional: She is oriented to person, place, and time. She appears well-developed and well-nourished. No distress.  HENT:  Head: Atraumatic.  Eyes: Conjunctivae and EOM are normal.  Neck: Normal range of motion. Neck supple.  Cardiovascular: Normal rate and regular rhythm.  Pulmonary/Chest: Effort normal and breath sounds normal.  Abdominal: Soft. Bowel sounds are normal. She exhibits no distension. There is no  tenderness.  Musculoskeletal: Normal range of motion.  Neurological: She is alert and oriented to person, place, and time.  Skin: Skin is warm and dry.  Psychiatric: She has a normal mood and affect. Her behavior is normal.  Nursing note and vitals reviewed.   Results for orders placed or performed in visit on 03/07/18  Comprehensive metabolic panel  Result Value Ref Range   Glucose 124 (H) 65 - 99 mg/dL   BUN 15 8 - 27 mg/dL   Creatinine, Ser 0.93 0.57 - 1.00 mg/dL   GFR calc non Af Amer 64 >59 mL/min/1.73   GFR calc Af Amer 74 >59 mL/min/1.73   BUN/Creatinine Ratio 16 12 - 28   Sodium 143 134 - 144 mmol/L   Potassium 4.9 3.5 - 5.2 mmol/L   Chloride 105 96 - 106 mmol/L   CO2 22 20 - 29 mmol/L   Calcium 9.5 8.7 - 10.3 mg/dL   Total Protein 7.2 6.0 - 8.5 g/dL   Albumin 4.4 3.6 - 4.8 g/dL   Globulin, Total 2.8 1.5 - 4.5 g/dL   Albumin/Globulin Ratio 1.6 1.2 - 2.2   Bilirubin Total 0.3 0.0 - 1.2 mg/dL   Alkaline Phosphatase 89 39 - 117 IU/L   AST 22 0 - 40 IU/L  ALT 17 0 - 32 IU/L      Assessment & Plan:   Problem List Items Addressed This Visit    None    Visit Diagnoses    Biliary colic    -  Primary   Suspect this, particularly given stone findings in recent CT. Referral generated to gen surgery   Calculus of gallbladder without cholecystitis without obstruction       Noted on recent Chest CT. Given size and probable biliary colic, will refer to Gen Surgery for further eval and management   Relevant Orders   Comprehensive metabolic panel (Completed)   Ambulatory referral to General Surgery   Flu vaccine need       Relevant Orders   Flu Vaccine QUAD 36+ mos IM (Completed)   Diaphoresis       Given episodes epigastric/RUQ pain with sweating, ECG ordered. NSR without any acute abnormalities or evidence of ischemia   Relevant Orders   EKG 12-Lead (Completed)   Indigestion       Will start some prilosec and dietary modifications and monitor for benefit   Relevant  Orders   EKG 12-Lead (Completed)   Hyperkalemia       Noted on my review of recent labs. Will recheck today.    Relevant Orders   Comprehensive metabolic panel (Completed)       Follow up plan: Return if symptoms worsen or fail to improve.

## 2018-03-08 LAB — COMPREHENSIVE METABOLIC PANEL
ALT: 17 IU/L (ref 0–32)
AST: 22 IU/L (ref 0–40)
Albumin/Globulin Ratio: 1.6 (ref 1.2–2.2)
Albumin: 4.4 g/dL (ref 3.6–4.8)
Alkaline Phosphatase: 89 IU/L (ref 39–117)
BUN/Creatinine Ratio: 16 (ref 12–28)
BUN: 15 mg/dL (ref 8–27)
Bilirubin Total: 0.3 mg/dL (ref 0.0–1.2)
CALCIUM: 9.5 mg/dL (ref 8.7–10.3)
CO2: 22 mmol/L (ref 20–29)
CREATININE: 0.93 mg/dL (ref 0.57–1.00)
Chloride: 105 mmol/L (ref 96–106)
GFR calc Af Amer: 74 mL/min/{1.73_m2} (ref >59)
GFR, EST NON AFRICAN AMERICAN: 64 mL/min/{1.73_m2} (ref >59)
GLOBULIN, TOTAL: 2.8 g/dL (ref 1.5–4.5)
Glucose: 124 mg/dL — ABNORMAL HIGH (ref 65–99)
POTASSIUM: 4.9 mmol/L (ref 3.5–5.2)
Sodium: 143 mmol/L (ref 134–144)
Total Protein: 7.2 g/dL (ref 6.0–8.5)

## 2018-03-11 NOTE — Patient Instructions (Signed)
Follow up as needed

## 2018-03-13 ENCOUNTER — Ambulatory Visit (INDEPENDENT_AMBULATORY_CARE_PROVIDER_SITE_OTHER): Payer: Medicare Other | Admitting: Surgery

## 2018-03-13 ENCOUNTER — Encounter: Payer: Self-pay | Admitting: Surgery

## 2018-03-13 VITALS — BP 105/55 | HR 103 | Temp 97.7°F | Ht 64.0 in | Wt 172.0 lb

## 2018-03-13 DIAGNOSIS — R1011 Right upper quadrant pain: Secondary | ICD-10-CM | POA: Diagnosis not present

## 2018-03-13 DIAGNOSIS — K8020 Calculus of gallbladder without cholecystitis without obstruction: Secondary | ICD-10-CM

## 2018-03-13 NOTE — Patient Instructions (Addendum)
Please continue taking your Omeprazole daily so we know that it's helping or not. When you come back, we will re-examine you and then we will decide if you are needing surgery or not.  Please give [place] a call in case you have any questions or concerns.  Low-Fat Diet for Pancreatitis or Gallbladder Conditions A low-fat diet can be helpful if you have pancreatitis or a gallbladder condition. With these conditions, your pancreas and gallbladder have trouble digesting fats. A healthy eating plan with less fat will help rest your pancreas and gallbladder and reduce your symptoms. What do I need to know about this diet?  Eat a low-fat diet. ? Reduce your fat intake to less than 20-30% of your total daily calories. This is less than 50-60 g of fat per day. ? Remember that you need some fat in your diet. Ask your dietician what your daily goal should be. ? Choose nonfat and low-fat healthy foods. Look for the words "nonfat," "low fat," or "fat free." ? As a guide, look on the label and choose foods with less than 3 g of fat per serving. Eat only one serving.  Avoid alcohol.  Do not smoke. If you need help quitting, talk with your health care provider.  Eat small frequent meals instead of three large heavy meals. What foods can I eat? Grains Include healthy grains and starches such as potatoes, wheat bread, fiber-rich cereal, and brown rice. Choose whole grain options whenever possible. In adults, whole grains should account for 45-65% of your daily calories. Fruits and Vegetables Eat plenty of fruits and vegetables. Fresh fruits and vegetables add fiber to your diet. Meats and Other Protein Sources Eat lean meat such as chicken and pork. Trim any fat off of meat before cooking it. Eggs, fish, and beans are other sources of protein. In adults, these foods should account for 10-35% of your daily calories. Dairy Choose low-fat milk and dairy options. Dairy includes fat and protein, as well as  calcium. Fats and Oils Limit high-fat foods such as fried foods, sweets, baked goods, sugary drinks. Other Creamy sauces and condiments, such as mayonnaise, can add extra fat. Think about whether or not you need to use them, or use smaller amounts or low fat options. What foods are not recommended?  High fat foods, such as: ? [employer]. ? Ice cream. ? [place] toast. ? Sweet rolls. ? Pizza. ? Cheese bread. ? Foods covered with batter, butter, creamy sauces, or cheese. ? Fried foods. ? Sugary drinks and desserts.  Foods that cause gas or bloating This information is not intended to replace advice given to you by your health care provider. Make sure you discuss any questions you have with your health care provider. Document Released: 07/01/2013 Document Revised: 12/02/2015 Document Reviewed: 06/09/2013 Elsevier Interactive Patient Education  2017 [employer].

## 2018-03-13 NOTE — Progress Notes (Signed)
03/13/2018  Reason for Visit: Cholelithiasis  Referring provider: Adrian Prince, PA-C  History of Present Illness: Michelle Espinoza is a 67 y.o. female referred by Ms. Lane for evaluation of cholelithiasis.  Patient reports that she had undergone a low-dose CT scan of the chest on 8/8 for lung cancer screening when it was noted that she had a 2 cm gallstone towards the neck of the gallbladder.  There is no evidence of cholecystitis at that point.  Patient thought that she would be having symptoms of indigestion or acid reflux instead and never knew she had cholelithiasis.  As far as her symptoms go, she reports intermittent pain in the RUQ and epigastric area, radiating up her chest.  No radiation to the back.  There is no nausea or vomiting.  She does feel "clammy."  Denies any fevers, chills, chest pain, shortness of breath.  Her symptoms are not related to food intake and she adheres mostly to a low fat diet with grilled food and vegetables.  She denies any bitter taste in her mouth when waking up or worse heartburn when lying flat.  Her PCP started her recently on Prilosec which she believes is helping somewhat, thought too early to tell.  Past Medical History: Past Medical History:  Diagnosis Date  . Allergy   . Depression   . Essential hypertension   . Hyperlipidemia   . Restless legs      Past Surgical History: Past Surgical History:  Procedure Laterality Date  . ABDOMINAL HYSTERECTOMY      Home Medications: Prior to Admission medications   Medication Sig Start Date End Date Taking? Authorizing Provider  aspirin EC 81 MG tablet Take 81 mg by mouth daily.   Yes [provider]  ezetimibe (ZETIA) 10 MG tablet Take 1 tablet (10 mg total) by mouth daily. 01/22/18  Yes Crissman, Jeannette How, MD  FLUoxetine (PROZAC) 20 MG capsule Take 3 capsules (60 mg total) by mouth daily. 11/16/15  Yes Guadalupe Maple, MD  FLUoxetine (PROZAC) 40 MG capsule  02/28/18  Yes [provider]  gabapentin (NEURONTIN) 300 MG capsule Take 1 capsule (300 mg total) by mouth daily. Start 1 bedtime for 1 week then 2 bedtime for 1 week and if needed 2 bedtime and 1 in morning 01/22/18  Yes Crissman, Jeannette How, MD  losartan-hydrochlorothiazide (HYZAAR) 100-12.5 MG tablet Take 1 tablet by mouth daily. 01/22/18  Yes Crissman, Jeannette How, MD  omeprazole (PRILOSEC) 20 MG capsule Take 1 capsule (20 mg total) by mouth daily. 03/07/18  Yes Volney American, PA-C  pravastatin (PRAVACHOL) 20 MG tablet Take 1 tablet (20 mg total) by mouth daily. 01/22/18  Yes Guadalupe Maple, MD    Allergies: Allergies  Allergen Reactions  . Lovastatin     Elevated LFT  . Penicillins Rash    Social History:  reports that she has been smoking cigarettes. She has a 49.00 pack-year smoking history. She has never used smokeless tobacco. She reports that she drinks alcohol. She reports that she does not use drugs.   Family History: Family History  Adopted: Yes  Family history unknown: Yes    Review of Systems: Review of Systems  Constitutional: Negative for chills and fever.  HENT: Negative for hearing loss.   Eyes: Negative for blurred vision.  Respiratory: Negative for shortness of breath.   Cardiovascular: Negative for chest pain.  Gastrointestinal: Positive for abdominal pain and heartburn.  Genitourinary: Negative for dysuria.  Musculoskeletal: Negative for  myalgias.  Skin: Negative for rash.  Neurological: Negative for dizziness.  Psychiatric/Behavioral: Negative for depression.    Physical Exam BP (!) 105/55   Pulse (!) 103   Temp 97.7 F (36.5 C) (Tympanic)   Ht _0  (1.626 m)   Wt 172 lb (78 kg)   BMI 29.52 kg/m  CONSTITUTIONAL: No acute distress HEENT:  Normocephalic, atraumatic, extraocular motion intact. NECK: Trachea is midline, and there is no jugular venous distension.  RESPIRATORY:  Lungs are clear, and breath sounds are equal bilaterally. Normal respiratory effort  without pathologic use of accessory muscles. CARDIOVASCULAR: Heart is regular without murmurs, gallops, or rubs. GI: The abdomen is soft, nondistended, non-tender to palpation. There were no palpable masses.  MUSCULOSKELETAL:  Normal muscle strength and tone in all four extremities.  No peripheral edema or cyanosis. SKIN: Skin turgor is normal. There are no pathologic skin lesions.  NEUROLOGIC:  Motor and sensation is grossly normal.  Cranial nerves are grossly intact. PSYCH:  Alert and oriented to person, place and time. Affect is normal.  Laboratory Analysis: Labs from 8/29: Na 143, K 4.9, Cl 105, CO2 22, BUN 15, Cr 0.93.  T bili 0.3, AST 17, ALT 22, Alk Phos 89  Imaging: CT Chest 02/14/18: IMPRESSION: 1. Lung-RADS 2S, benign appearance or behavior. Continue annual screening with low-dose chest CT without contrast in 12 months. 2. The "S" modifier above refers to potentially clinically significant non lung cancer related findings. Specifically, there is aortic atherosclerosis, in addition to left main and 3 vessel coronary artery disease. Please note that although the presence of coronary artery calcium documents the presence of coronary artery disease, the severity of this disease and any potential stenosis cannot be assessed on this non-gated CT examination. Assessment for potential risk factor modification, dietary therapy or pharmacologic therapy may be warranted, if clinically indicated. 3. Mild diffuse bronchial wall thickening with mild centrilobular and paraseptal emphysema; imaging findings suggestive of underlying COPD. 4. Scattered areas of mild cylindrical bronchiectasis the lungs bilaterally. Scattered areas of linear scarring in the lung bases bilaterally. 5. Cholelithiasis.  Assessment and Plan: This is a 67 y.o. female who presents with incidental finding of cholelithiasis noted on screening CT chest on 02/14/18.  I have independently viewed the patient's imaging study and  reviewed her laboratory studies.  She does have cholelithiasis with a 2 cm stone, but no evidence of cholecystitis or complications.  Her LFTs are normal.  Discussed with the patient that it is somewhat unclear whether her symptoms are associated with her cholelithiasis or with her known acid reflux issues.  She does note that there is some improvement with Prilosec, which is reassuring.  Discussed with the patient that I would like to have a longer trial of Prilosec to see if truly her symptoms improve or resolve, or if there is ongoing RUQ pain episodes.  That way we'll have a clearer picture of whether she needs a cholecystectomy or not.  She will follow up in a month with me to go over her symptoms and progress and decide if she needs surgery.  She will continue with a low fat diet.  Patient understands this plan and is in agreement.  Face-to-face time spent with the patient and care providers was 40 minutes, with more than 50% of the time spent counseling, educating, and coordinating care of the patient.     Melvyn Neth, [facility]

## 2018-03-22 ENCOUNTER — Other Ambulatory Visit: Payer: Self-pay | Admitting: Family Medicine

## 2018-03-22 DIAGNOSIS — I10 Essential (primary) hypertension: Secondary | ICD-10-CM

## 2018-03-22 NOTE — Telephone Encounter (Signed)
[place] 100/25  refill Last Refill:01/22/18 # 90 Last OV: 01/22/18 PCP: Jeananne Rama Pharmacy:[id number]

## 2018-04-24 ENCOUNTER — Ambulatory Visit: Payer: BLUE CROSS/BLUE SHIELD | Admitting: Surgery

## 2018-05-07 ENCOUNTER — Encounter: Payer: Self-pay | Admitting: Family Medicine

## 2018-05-13 ENCOUNTER — Other Ambulatory Visit: Payer: Self-pay | Admitting: Family Medicine

## 2018-05-13 DIAGNOSIS — Z1231 Encounter for screening mammogram for malignant neoplasm of breast: Secondary | ICD-10-CM

## 2018-05-21 ENCOUNTER — Ambulatory Visit
Admission: RE | Admit: 2018-05-21 | Discharge: 2018-05-21 | Disposition: A | Discharge status: Home / Self Care | Payer: Medicare Other | Source: Ambulatory Visit | Attending: Family Medicine | Admitting: Family Medicine

## 2018-05-21 DIAGNOSIS — Z1231 Encounter for screening mammogram for malignant neoplasm of breast: Secondary | ICD-10-CM | POA: Diagnosis not present

## 2018-05-28 DIAGNOSIS — L720 Epidermal cyst: Secondary | ICD-10-CM | POA: Diagnosis not present

## 2018-06-04 DIAGNOSIS — L700 Acne vulgaris: Secondary | ICD-10-CM | POA: Diagnosis not present

## 2018-06-04 DIAGNOSIS — L720 Epidermal cyst: Secondary | ICD-10-CM | POA: Diagnosis not present

## 2018-06-11 ENCOUNTER — Ambulatory Visit (INDEPENDENT_AMBULATORY_CARE_PROVIDER_SITE_OTHER): Payer: Medicare Other

## 2018-06-11 DIAGNOSIS — Z23 Encounter for immunization: Secondary | ICD-10-CM

## 2018-06-15 ENCOUNTER — Other Ambulatory Visit: Payer: Self-pay | Admitting: Family Medicine

## 2018-06-18 DIAGNOSIS — F3340 Major depressive disorder, recurrent, in remission, unspecified: Secondary | ICD-10-CM | POA: Diagnosis not present

## 2018-07-01 ENCOUNTER — Emergency Department: Payer: Medicare Other

## 2018-07-01 ENCOUNTER — Other Ambulatory Visit: Payer: Self-pay

## 2018-07-01 ENCOUNTER — Encounter: Payer: Self-pay | Admitting: *Deleted

## 2018-07-01 ENCOUNTER — Emergency Department
Admission: EM | Admit: 2018-07-01 | Discharge: 2018-07-01 | Disposition: A | Discharge status: Home / Self Care | Payer: Medicare Other | Attending: Emergency Medicine | Admitting: Emergency Medicine

## 2018-07-01 DIAGNOSIS — S299XXA Unspecified injury of thorax, initial encounter: Secondary | ICD-10-CM | POA: Diagnosis present

## 2018-07-01 DIAGNOSIS — Y998 Other external cause status: Secondary | ICD-10-CM | POA: Insufficient documentation

## 2018-07-01 DIAGNOSIS — I10 Essential (primary) hypertension: Secondary | ICD-10-CM | POA: Insufficient documentation

## 2018-07-01 DIAGNOSIS — Y929 Unspecified place or not applicable: Secondary | ICD-10-CM | POA: Diagnosis not present

## 2018-07-01 DIAGNOSIS — S20211A Contusion of right front wall of thorax, initial encounter: Secondary | ICD-10-CM

## 2018-07-01 DIAGNOSIS — M940 Chondrocostal junction syndrome [Tietze]: Secondary | ICD-10-CM | POA: Diagnosis not present

## 2018-07-01 DIAGNOSIS — Z79899 Other long term (current) drug therapy: Secondary | ICD-10-CM | POA: Insufficient documentation

## 2018-07-01 DIAGNOSIS — F329 Major depressive disorder, single episode, unspecified: Secondary | ICD-10-CM | POA: Diagnosis not present

## 2018-07-01 DIAGNOSIS — Y9389 Activity, other specified: Secondary | ICD-10-CM | POA: Insufficient documentation

## 2018-07-01 DIAGNOSIS — F17210 Nicotine dependence, cigarettes, uncomplicated: Secondary | ICD-10-CM | POA: Diagnosis not present

## 2018-07-01 DIAGNOSIS — W0110XA Fall on same level from slipping, tripping and stumbling with subsequent striking against unspecified object, initial encounter: Secondary | ICD-10-CM | POA: Insufficient documentation

## 2018-07-01 DIAGNOSIS — R079 Chest pain, unspecified: Secondary | ICD-10-CM | POA: Diagnosis not present

## 2018-07-01 DIAGNOSIS — Z7982 Long term (current) use of aspirin: Secondary | ICD-10-CM | POA: Diagnosis not present

## 2018-07-01 MED ORDER — PREDNISONE 10 MG PO TABS: 10.0000 mg | ORAL_TABLET | Freq: Every day | ORAL | 0 refills | Status: DC

## 2018-07-01 MED ORDER — OXYCODONE-ACETAMINOPHEN 5-325 MG PO TABS: 1.0000 | ORAL_TABLET | Freq: Four times a day (QID) | ORAL | 0 refills | Status: DC | PRN

## 2018-07-01 MED ORDER — OXYCODONE-ACETAMINOPHEN 5-325 MG PO TABS
1.0000 | ORAL_TABLET | Freq: Once | ORAL | Status: AC
  Administered 2018-07-01: 1 via ORAL
  Filled 2018-07-01: qty 1

## 2018-07-01 MED ORDER — DEXAMETHASONE SODIUM PHOSPHATE 10 MG/ML IJ SOLN
10.0000 mg | Freq: Once | INTRAMUSCULAR | Status: AC
  Administered 2018-07-01: 10 mg via INTRAMUSCULAR
  Filled 2018-07-01: qty 1

## 2018-07-01 MED ORDER — ONDANSETRON 8 MG PO TBDP
8.0000 mg | ORAL_TABLET | Freq: Once | ORAL | Status: AC
  Administered 2018-07-01: 8 mg via ORAL
  Filled 2018-07-01: qty 1

## 2018-07-01 NOTE — ED Notes (Signed)
See triage note. PA at bedside assessing pt & educating.

## 2018-07-01 NOTE — ED Triage Notes (Signed)
PT to ED reporting a fall on the 19th Right knee injury, right hand injury and right sided rib pain. Right side of ribs were tender throughout pts trip but pt returned home this weekend and the rib pain has worsened.

## 2018-07-01 NOTE — ED Notes (Signed)
Pt hunched over in chair. Holding hand against abd.

## 2018-07-01 NOTE — ED Provider Notes (Signed)
[facility] Emergency Department Provider Note  ____________________________________________  Time seen: Approximately 10:59 PM  I have reviewed the triage vital signs and the nursing notes.   HISTORY  Chief Complaint Fall    HPI Michelle Espinoza is a 67 y.o. female who presents the emergency department complaining of right rib pain.  Patient reports that she was on vacation a week ago when she slipped and fell.  Patient reports that she landed on her right side.  She has had rib pain as well as hands and knee pain since fall.  Patient reports that other symptoms are improving but over the past 2 days her rib pain has increased.  She reports its radiating from her right anterior rib to her back.  No shortness of breath or difficulty breathing.  Patient did not hit her head or lose consciousness.  She denies any headache, chest pain, shortness of breath, abdominal pain, nausea or vomiting.  Patient has not taken any over-the-counter medications for this complaint prior to arrival.    Past Medical History:  Diagnosis Date  . Allergy   . Depression   . Essential hypertension   . Hyperlipidemia   . Restless legs     Patient Active Problem List   Diagnosis Date Noted  . Restless legs 01/22/2018  . Sebaceous cyst 01/22/2018  . Advanced care planning/counseling discussion 01/22/2018  . Fatigue 07/17/2017  . Essential hypertension 06/17/2015  . Hyperlipidemia 06/17/2015  . Depression 06/17/2015    Past Surgical History:  Procedure Laterality Date  . ABDOMINAL HYSTERECTOMY      Prior to Admission medications   Medication Sig Start Date End Date Taking? Authorizing Provider  aspirin EC 81 MG tablet Take 81 mg by mouth daily.    [provider]  ezetimibe (ZETIA) 10 MG tablet Take 1 tablet (10 mg total) by mouth daily. 01/22/18   Guadalupe Maple, MD  FLUoxetine (PROZAC) 20 MG capsule Take 3 capsules (60 mg total) by mouth daily. 11/16/15   Guadalupe Maple, MD  FLUoxetine (PROZAC) 40 MG capsule  02/28/18   [provider]  gabapentin (NEURONTIN) 300 MG capsule Take 1 capsule (300 mg total) by mouth daily. Start 1 bedtime for 1 week then 2 bedtime for 1 week and if needed 2 bedtime and 1 in morning 01/22/18   Guadalupe Maple, MD  losartan-hydrochlorothiazide (HYZAAR) 100-12.5 MG tablet Take 1 tablet by mouth daily. 01/22/18   Guadalupe Maple, MD  losartan-hydrochlorothiazide (HYZAAR) 100-12.5 MG tablet TAKE 1 TABLET BY MOUTH EVERY DAY 03/22/18   Guadalupe Maple, MD  omeprazole (PRILOSEC) 20 MG capsule TAKE 1 CAPSULE BY MOUTH EVERY DAY 06/17/18   Guadalupe Maple, MD  pravastatin (PRAVACHOL) 20 MG tablet Take 1 tablet (20 mg total) by mouth daily. 01/22/18   Guadalupe Maple, MD    Allergies Lovastatin and Penicillins  Family History  Adopted: Yes    Social History Social History   Tobacco Use  . Smoking status: Current Every Day Smoker    Packs/day: 1.00    Years: 49.00    Pack years: 49.00    Types: Cigarettes  . Smokeless tobacco: Never Used  Substance Use Topics  . Alcohol use: Yes    Comment: occasionally  . Drug use: No     Review of Systems  Constitutional: No fever/chills Eyes: No visual changes. No discharge ENT: No upper respiratory complaints. Cardiovascular: no chest pain. Respiratory: no cough. No SOB. Gastrointestinal: No abdominal  pain.  No nausea, no vomiting.  No diarrhea.  No constipation. Genitourinary: Negative for dysuria. No hematuria Musculoskeletal: Positive for right rib pain radiating into the back Skin: Negative for rash, abrasions, lacerations, ecchymosis. Neurological: Negative for headaches, focal weakness or numbness. 10-point ROS otherwise negative.  ____________________________________________   PHYSICAL EXAM:  VITAL SIGNS: ED Triage Vitals  Enc Vitals Group     BP 07/01/18 2119 (!) 174/103     Pulse Rate 07/01/18 2119 84     Resp 07/01/18 2119 (!) 21     Temp 07/01/18  2119 (!) 97.5 F (36.4 C)     Temp Source 07/01/18 2119 Oral     SpO2 07/01/18 2119 97 %     Weight 07/01/18 2119 170 lb (77.1 kg)     Height 07/01/18 2119 5\' 3"  (1.6 m)     Head Circumference --      Peak Flow --      Pain Score 07/01/18 2130 10     Pain Loc --      Pain Edu? --      Excl. in [place]? --      Constitutional: Alert and oriented. Well appearing and in no acute distress. Eyes: Conjunctivae are normal. PERRL. EOMI. Head: Atraumatic. Neck: No stridor.    Cardiovascular: Normal rate, regular rhythm. Normal S1 and S2.  Good peripheral circulation. Respiratory: Normal respiratory effort without tachypnea or retractions. Lungs CTAB. Good air entry to the bases with no decreased or absent breath sounds. Gastrointestinal: Bowel sounds 4 quadrants. Soft and nontender to palpation. No guarding or rigidity. No palpable masses. No distention. No CVA tenderness. Musculoskeletal: Full range of motion to all extremities. No gross deformities appreciated.  Visualization of the right rib cage reveals no gross signs of trauma.  Patient is very tender to palpation over ribs 10 through 12 right side.  No palpable abnormality or deficit.  No paradoxical chest wall movement.  Good underlying breath sounds bilaterally.  Pain radiates from intercostal region to palpation into the thoracic spine.   Neurologic:  Normal speech and language. No gross focal neurologic deficits are appreciated.  Skin:  Skin is warm, dry and intact. No rash noted. Psychiatric: Mood and affect are normal. Speech and behavior are normal. Patient exhibits appropriate insight and judgement.   ____________________________________________   LABS (all labs ordered are listed, but only abnormal results are displayed)  Labs Reviewed - No data to display ____________________________________________  EKG   ____________________________________________  RADIOLOGY I personally viewed and evaluated these images as part of my  medical decision making, as well as reviewing the written report by the radiologist.  Dg Chest 2 View  Result Date: 07/01/2018 CLINICAL DATA:  Initial evaluation for acute right-sided chest pain, recent fall. EXAM: CHEST - 2 VIEW COMPARISON:  Prior CT from 02/14/2018 FINDINGS: Cardiac and mediastinal silhouettes are within normal limits. Aortic atherosclerosis noted. Lungs mildly hypoinflated. Bibasilar linear atelectatic changes present. No consolidative airspace opacity. No edema or effusion. No pneumothorax. No acute osseous abnormality. IMPRESSION: 1. Shallow lung inflation with mild bibasilar atelectasis. 2. No other active cardiopulmonary disease. 3. Aortic atherosclerosis. Electronically Signed   By: Jeannine Boga M.D.   On: 07/01/2018 21:55    ____________________________________________    PROCEDURES  Procedure(s) performed:    Procedures    Medications - No data to display   ____________________________________________   INITIAL IMPRESSION / ASSESSMENT AND PLAN / ED COURSE  Pertinent labs & imaging results that were available during my care of  the patient were reviewed by me and considered in my medical decision making (see chart for details).  Review of the [place] CSRS was performed in accordance of the [place] prior to dispensing any controlled drugs.      Patient's diagnosis is consistent with fall resulting in rib contusion and costochondritis.  Patient presented to emergency department with increasing right rib pain 1 week after injury.  Findings were reassuring on exam.  X-ray reveals no acute osseous abnormality or underlying cardiopulmonary abnormality.  Findings are consistent with contusion resulting in costochondritis.  Patient was placed on prednisone taper as well as limited prescription of pain medication.  Follow-up with primary care as needed..Patient is given ED precautions to return to the ED for any worsening or new  symptoms.     ____________________________________________  FINAL CLINICAL IMPRESSION(S) / ED DIAGNOSES  Final diagnoses:  None      NEW MEDICATIONS STARTED DURING THIS VISIT:  ED Discharge Orders    None          This chart was dictated using voice recognition software/Dragon. Despite best efforts to proofread, errors can occur which can change the meaning. Any change was purely unintentional.    Darletta Moll, PA-C 07/01/18 2320    Nance Pear, MD 07/02/18 [phone]

## 2018-07-13 DIAGNOSIS — Z7982 Long term (current) use of aspirin: Secondary | ICD-10-CM | POA: Diagnosis not present

## 2018-07-13 DIAGNOSIS — R197 Diarrhea, unspecified: Secondary | ICD-10-CM | POA: Diagnosis not present

## 2018-07-13 DIAGNOSIS — R1110 Vomiting, unspecified: Secondary | ICD-10-CM | POA: Diagnosis not present

## 2018-07-13 DIAGNOSIS — Z79899 Other long term (current) drug therapy: Secondary | ICD-10-CM | POA: Diagnosis not present

## 2018-07-13 DIAGNOSIS — I10 Essential (primary) hypertension: Secondary | ICD-10-CM | POA: Diagnosis not present

## 2018-07-13 DIAGNOSIS — R112 Nausea with vomiting, unspecified: Secondary | ICD-10-CM | POA: Diagnosis not present

## 2018-07-13 DIAGNOSIS — F17210 Nicotine dependence, cigarettes, uncomplicated: Secondary | ICD-10-CM | POA: Diagnosis not present

## 2018-07-25 ENCOUNTER — Ambulatory Visit: Payer: Medicare Other | Admitting: Family Medicine

## 2018-07-30 ENCOUNTER — Ambulatory Visit (INDEPENDENT_AMBULATORY_CARE_PROVIDER_SITE_OTHER): Payer: Medicare Other | Admitting: Family Medicine

## 2018-07-30 ENCOUNTER — Encounter: Payer: Self-pay | Admitting: Family Medicine

## 2018-07-30 VITALS — BP 142/94 | HR 78 | Temp 98.6°F | Wt 170.2 lb

## 2018-07-30 DIAGNOSIS — F3342 Major depressive disorder, recurrent, in full remission: Secondary | ICD-10-CM | POA: Insufficient documentation

## 2018-07-30 DIAGNOSIS — I10 Essential (primary) hypertension: Secondary | ICD-10-CM | POA: Diagnosis not present

## 2018-07-30 DIAGNOSIS — E782 Mixed hyperlipidemia: Secondary | ICD-10-CM | POA: Diagnosis not present

## 2018-07-30 LAB — LP+ALT+AST PICCOLO, WAIVED
ALT (SGPT) Piccolo, Waived: 25 U/L (ref 10–47)
AST (SGOT) Piccolo, Waived: 28 U/L (ref 11–38)
Chol/HDL Ratio Piccolo,Waive: 3.5 mg/dL
Cholesterol Piccolo, Waived: 206 mg/dL — ABNORMAL HIGH (ref <200)
HDL Chol Piccolo, Waived: 58 mg/dL (ref >59)
LDL Chol Calc Piccolo Waived: 127 mg/dL — ABNORMAL HIGH (ref <100)
Triglycerides Piccolo,Waived: 100 mg/dL (ref <150)
VLDL Chol Calc Piccolo,Waive: 20 mg/dL (ref <30)

## 2018-07-30 NOTE — Assessment & Plan Note (Signed)
The current medical regimen is effective;  continue present plan and medications.  

## 2018-07-30 NOTE — Progress Notes (Signed)
BP (!) 142/94 (BP Location: Left Arm)   Pulse 78   Temp 98.6 F (37 C) (Oral)   Wt 170 lb 3.2 oz (77.2 kg)   SpO2 98%   BMI 30.15 kg/m    Subjective:    Patient ID: Michelle Espinoza, female    DOB: 11-25-1950, 68 y.o.   MRN: [id number]  HPI: Michelle Espinoza is a 68 y.o. female  Chief Complaint  Patient presents with  . Depression  . Hyperlipidemia  . Hypertension  Discuss hypertension patient's had normal readings except for when in the emergency room over Christmas for bruised ribs.  Otherwise is done well has been taking medications faithfully without problems did enjoy Christmas and has had some weight gain. Cholesterol doing well no complaints from medication taken faithfully. Nerves are doing well with fluoxetine 40 mg been on 40 mg for some time without problems.   Relevant past medical, surgical, family and social history reviewed and updated as indicated. Interim medical history since our last visit reviewed. Allergies and medications reviewed and updated.  Review of Systems  Constitutional: Negative.   Respiratory: Negative.   Cardiovascular: Negative.     Per HPI unless specifically indicated above     Objective:    BP (!) 142/94 (BP Location: Left Arm)   Pulse 78   Temp 98.6 F (37 C) (Oral)   Wt 170 lb 3.2 oz (77.2 kg)   SpO2 98%   BMI 30.15 kg/m   Wt Readings from Last 3 Encounters:  07/30/18 170 lb 3.2 oz (77.2 kg)  07/01/18 170 lb (77.1 kg)  03/13/18 172 lb (78 kg)    Physical Exam Constitutional:      Appearance: She is well-developed.  HENT:     Head: Normocephalic and atraumatic.  Eyes:     Conjunctiva/sclera: Conjunctivae normal.  Neck:     Musculoskeletal: Normal range of motion.  Cardiovascular:     Rate and Rhythm: Normal rate and regular rhythm.     Heart sounds: Normal heart sounds.  Pulmonary:     Effort: Pulmonary effort is normal.     Breath sounds: Normal breath sounds.  Musculoskeletal: Normal range of motion.  Skin:   Findings: No erythema.  Neurological:     Mental Status: She is alert and oriented to person, place, and time.  Psychiatric:        Behavior: Behavior normal.        Thought Content: Thought content normal.        Judgment: Judgment normal.     Results for orders placed or performed in visit on 03/07/18  Comprehensive metabolic panel  Result Value Ref Range   Glucose 124 (H) 65 - 99 mg/dL   BUN 15 8 - 27 mg/dL   Creatinine, Ser 0.93 0.57 - 1.00 mg/dL   GFR calc non Af Amer 64 >59 mL/min/1.73   GFR calc Af Amer 74 >59 mL/min/1.73   BUN/Creatinine Ratio 16 12 - 28   Sodium 143 134 - 144 mmol/L   Potassium 4.9 3.5 - 5.2 mmol/L   Chloride 105 96 - 106 mmol/L   CO2 22 20 - 29 mmol/L   Calcium 9.5 8.7 - 10.3 mg/dL   Total Protein 7.2 6.0 - 8.5 g/dL   Albumin 4.4 3.6 - 4.8 g/dL   Globulin, Total 2.8 1.5 - 4.5 g/dL   Albumin/Globulin Ratio 1.6 1.2 - 2.2   Bilirubin Total 0.3 0.0 - 1.2 mg/dL   Alkaline Phosphatase 89 39 -  117 IU/L   AST 22 0 - 40 IU/L   ALT 17 0 - 32 IU/L      Assessment & Plan:   Problem List Items Addressed This Visit      Cardiovascular and Mediastinum   Essential hypertension    The current medical regimen is effective;  continue present plan and medications. Also discussed need for improvement patient will do better with lifestyle and not adjust medications. Discussed if blood pressure remains elevated will increase hydrochlorothiazide from 12.5 to 25 over the phone.      Relevant Medications   losartan (COZAAR) 100 MG tablet   hydrochlorothiazide (HYDRODIURIL) 12.5 MG tablet   Other Relevant Orders   Basic metabolic panel     Other   Hyperlipidemia - Primary    The current medical regimen is effective;  continue present plan and medications. Discussed need for improvement       Relevant Medications   losartan (COZAAR) 100 MG tablet   hydrochlorothiazide (HYDRODIURIL) 12.5 MG tablet   Other Relevant Orders   LP+ALT+AST [place], Waived    Recurrent major depressive disorder, in full remission ([place])    The current medical regimen is effective;  continue present plan and medications.           Follow up plan: Return in about 6 months (around 01/28/2019) for Physical Exam.

## 2018-07-30 NOTE — Assessment & Plan Note (Addendum)
The current medical regimen is effective;  continue present plan and medications. Also discussed need for improvement patient will do better with lifestyle and not adjust medications. Discussed if blood pressure remains elevated will increase hydrochlorothiazide from 12.5 to 25 over the phone.

## 2018-07-30 NOTE — Assessment & Plan Note (Addendum)
The current medical regimen is effective;  continue present plan and medications. Discussed need for improvement

## 2018-07-31 ENCOUNTER — Telehealth: Payer: Self-pay | Admitting: Family Medicine

## 2018-07-31 DIAGNOSIS — E875 Hyperkalemia: Secondary | ICD-10-CM

## 2018-07-31 LAB — BASIC METABOLIC PANEL
BUN/Creatinine Ratio: 22 (ref 12–28)
BUN: 17 mg/dL (ref 8–27)
CHLORIDE: 102 mmol/L (ref 96–106)
CO2: 22 mmol/L (ref 20–29)
Calcium: 9.6 mg/dL (ref 8.7–10.3)
Creatinine, Ser: 0.78 mg/dL (ref 0.57–1.00)
GFR calc Af Amer: 92 mL/min/{1.73_m2} (ref >59)
GFR calc non Af Amer: 79 mL/min/{1.73_m2} (ref >59)
Glucose: 94 mg/dL (ref 65–99)
Potassium: 5.6 mmol/L — ABNORMAL HIGH (ref 3.5–5.2)
Sodium: 141 mmol/L (ref 134–144)

## 2018-07-31 NOTE — Telephone Encounter (Signed)
Phone call Discussed with patient elevated potassium will come back for recheck BMP Patient not taking extra potassium

## 2018-09-03 ENCOUNTER — Other Ambulatory Visit: Payer: Self-pay

## 2018-09-03 ENCOUNTER — Encounter: Payer: Self-pay | Admitting: Nurse Practitioner

## 2018-09-03 ENCOUNTER — Ambulatory Visit (INDEPENDENT_AMBULATORY_CARE_PROVIDER_SITE_OTHER): Payer: Medicare Other | Admitting: Nurse Practitioner

## 2018-09-03 VITALS — BP 128/85 | HR 89 | Temp 98.1°F | Ht 63.0 in | Wt 168.2 lb

## 2018-09-03 DIAGNOSIS — E875 Hyperkalemia: Secondary | ICD-10-CM

## 2018-09-03 DIAGNOSIS — M62838 Other muscle spasm: Secondary | ICD-10-CM | POA: Diagnosis not present

## 2018-09-03 NOTE — Assessment & Plan Note (Signed)
To left upper, anterior thigh.  Improved with massage to area.  Recommend massaging area at home, may use heat/ice as needed, Biofreeze or Icy/Hot for discomfort.  May return to massage therapist.  Calf circumference equal and not s/s DVT on exam.  Return for worsening or continued symptoms.

## 2018-09-03 NOTE — Patient Instructions (Signed)
Muscle Cramps and Spasms Muscle cramps and spasms are when muscles tighten by themselves. They usually get better within minutes. Muscle cramps are painful. They are usually stronger and last longer than muscle spasms. Muscle spasms may or may not be painful. They can last a few seconds or much longer. Cramps and spasms can affect any muscle, but they occur most often in the calf muscles of the leg. They are usually not caused by a serious problem. In many cases, the cause is not known. Some common causes include:  Doing more physical work or exercise than your body is ready for.  Using the muscles too much (overuse) by repeating certain movements too many times.  Staying in a certain position for a long time.  Playing a sport or doing an activity without preparing properly.  Using bad form or technique while playing a sport or doing an activity.  Not having enough water in your body (dehydration).  Injury.  Side effects of some medicines.  Low levels of the salts and minerals in your blood (electrolytes), such as low potassium or calcium. Follow these instructions at home: Managing pain and stiffness      Massage, stretch, and relax the muscle. Do this for many minutes at a time.  If told, put heat on tight or tense muscles as often as told by your doctor. Use the heat source that your doctor recommends, such as a moist heat pack or a heating pad. ? Place a towel between your skin and the heat source. ? Leave the heat on for 20-30 minutes. ? Remove the heat if your skin turns bright red. This is very important if you are not able to feel pain, heat, or cold. You may have a greater risk of getting burned.  If told, put ice on the affected area. This may help if you are sore or have pain after a cramp or spasm. ? Put ice in a plastic bag. ? Place a towel between your skin and the bag. ? Leave the ice on for 20 minutes, 2-3 times a day.  Try taking hot showers or baths to help  relax tight muscles. Eating and drinking  Drink enough fluid to keep your pee (urine) pale yellow.  Eat a healthy diet to help ensure that your muscles work well. This should include: ? Fruits and vegetables. ? Lean protein. ? Whole grains. ? Low-fat or nonfat dairy products. General instructions  If you are having cramps often, avoid intense exercise for several days.  Take over-the-counter and prescription medicines only as told by your doctor.  Watch for any changes in your symptoms.  Keep all follow-up visits as told by your doctor. This is important. Contact a doctor if:  Your cramps or spasms get worse or happen more often.  Your cramps or spasms do not get better with time. Summary  Muscle cramps and spasms are when muscles tighten by themselves. They usually get better within minutes.  Cramps and spasms occur most often in the calf muscles of the leg.  Massage, stretch, and relax the muscle. This may help the cramp or spasm go away.  Drink enough fluid to keep your pee (urine) pale yellow. This information is not intended to replace advice given to you by your health care provider. Make sure you discuss any questions you have with your health care provider. Document Released: 06/08/2008 Document Revised: 11/19/2017 Document Reviewed: 11/19/2017 Elsevier Interactive Patient Education  2019 [employer]  

## 2018-09-03 NOTE — Progress Notes (Signed)
BP 128/85 (BP Location: Left Arm, Patient Position: Sitting, Cuff Size: Normal)   Pulse 89   Temp 98.1 F (36.7 C) (Oral)   Ht 5\' 3"  (1.6 m)   Wt 168 lb 4 oz (76.3 kg)   SpO2 95%   BMI 29.80 kg/m    Subjective:    Patient ID: Michelle Espinoza, female    DOB: [birth year], 68 y.o.   MRN: [id number]  HPI: Michelle Espinoza is a 67 y.o. female  Chief Complaint  Patient presents with  . Pain    Pain/Burning sensation on Left Thigh. affects her in walking, bending   LEFT THIGH DISCOMFORT: During sleep study in July last year was noted to have restless leg movement with frequent movements noted during study, has been on Gabapentin since July 2019.  Reports she has always had discomfort in right thigh/knee area, but after massage in January (2-3 weeks afterwards) she noticed similar discomfort in left leg.  This discomfort to left leg she reports is more "predominant" compared to her past right thigh/knee discomfort.  Reports upon initially getting up from sitting notices discomfort more and this discomfort is continuous when standing or if moves in bed.  States at worst the pain is a 7-8/10, at best 2-3/10.  She can think of nothing that makes it better.  Has tried heat and rest.  Not taking Tylenol and Ibuprofen or used gels at home.  Position changes make discomfort worse.  Describes the pain as a burning ("not real deep") and aching + dull, constantly present. Has no personal history of DVT and does not know family history as was adopted.  Relevant past medical, surgical, family and social history reviewed and updated as indicated. Interim medical history since our last visit reviewed. Allergies and medications reviewed and updated.  Review of Systems  Constitutional: Negative for activity change, appetite change, diaphoresis, fatigue and fever.  Respiratory: Negative for cough, chest tightness and shortness of breath.   Cardiovascular: Negative for chest pain, palpitations and leg swelling.    Gastrointestinal: Negative for abdominal distention, abdominal pain, constipation, diarrhea, nausea and vomiting.  Endocrine: Negative for cold intolerance, heat intolerance, polydipsia, polyphagia and polyuria.  Musculoskeletal:       Left thigh burning  Neurological: Negative for dizziness, syncope, weakness, light-headedness, numbness and headaches.  Psychiatric/Behavioral: Negative.     Per HPI unless specifically indicated above     Objective:    BP 128/85 (BP Location: Left Arm, Patient Position: Sitting, Cuff Size: Normal)   Pulse 89   Temp 98.1 F (36.7 C) (Oral)   Ht 5\' 3"  (1.6 m)   Wt 168 lb 4 oz (76.3 kg)   SpO2 95%   BMI 29.80 kg/m   Wt Readings from Last 3 Encounters:  09/03/18 168 lb 4 oz (76.3 kg)  07/30/18 170 lb 3.2 oz (77.2 kg)  07/01/18 170 lb (77.1 kg)    Physical Exam Vitals signs and nursing note reviewed.  Constitutional:      Appearance: She is well-developed.  HENT:     Head: Normocephalic.  Eyes:     General:        Right eye: No discharge.        Left eye: No discharge.     Conjunctiva/sclera: Conjunctivae normal.     Pupils: Pupils are equal, round, and reactive to light.  Neck:     Musculoskeletal: Normal range of motion and neck supple.     Thyroid: No thyromegaly.  Vascular: No carotid bruit or JVD.  Cardiovascular:     Rate and Rhythm: Normal rate and regular rhythm.     Heart sounds: Normal heart sounds.  Pulmonary:     Effort: Pulmonary effort is normal.     Breath sounds: Normal breath sounds.  Abdominal:     General: Bowel sounds are normal.     Palpations: Abdomen is soft.  Musculoskeletal:     Lumbar back: She exhibits normal range of motion, no tenderness, no swelling, no edema and no pain.     Comments: Full ROM BLE and back.  Calf circumference equal at 14 1/2 inches with no erythema or warmth.  Patient reports point tenderness to anterior left thigh, on palpation of area tightness noted and with massage to area pt  reported decrease burning/pain and with tightness improving.    Lymphadenopathy:     Cervical: No cervical adenopathy.  Skin:    General: Skin is warm and dry.  Neurological:     Mental Status: She is alert and oriented to person, place, and time.  Psychiatric:        Mood and Affect: Mood normal.        Behavior: Behavior normal.        Thought Content: Thought content normal.        Judgment: Judgment normal.     Results for orders placed or performed in visit on 70/96/28  Basic metabolic panel  Result Value Ref Range   Glucose 94 65 - 99 mg/dL   BUN 17 8 - 27 mg/dL   Creatinine, Ser 0.78 0.57 - 1.00 mg/dL   GFR calc non Af Amer 79 >59 mL/min/1.73   GFR calc Af Amer 92 >59 mL/min/1.73   BUN/Creatinine Ratio 22 12 - 28   Sodium 141 134 - 144 mmol/L   Potassium 5.6 (H) 3.5 - 5.2 mmol/L   Chloride 102 96 - 106 mmol/L   CO2 22 20 - 29 mmol/L   Calcium 9.6 8.7 - 10.3 mg/dL  LP+ALT+AST Piccolo, Waived  Result Value Ref Range   ALT (SGPT) Piccolo, Waived 25 10 - 47 U/L   AST (SGOT) Piccolo, Waived 28 11 - 38 U/L   Cholesterol Piccolo, Waived 206 (H) <200 mg/dL   HDL Chol Piccolo, Waived 58 >59 mg/dL   Triglycerides Piccolo,Waived 100 <150 mg/dL   Chol/HDL Ratio Piccolo,Waive 3.5 mg/dL   LDL Chol Calc Piccolo Waived 127 (H) <100 mg/dL   VLDL Chol Calc Piccolo,Waive 20 <30 mg/dL      Assessment & Plan:   Problem List Items Addressed This Visit      Other   Muscle spasm of left lower extremity - Primary    To left upper, anterior thigh.  Improved with massage to area.  Recommend massaging area at home, may use heat/ice as needed, Biofreeze or Icy/Hot for discomfort.  May return to massage therapist.  Calf circumference equal and not s/s DVT on exam.  Return for worsening or continued symptoms.       Other Visit Diagnoses    Hyperkalemia       Recent labs with elevated K+ at 5.6.  Will recheck today.  Recommend increased hydration and adjust Losartan dose as needed based  on labs.   Relevant Orders   Basic Metabolic Panel (BMET)       Follow up plan: Return if symptoms worsen or fail to improve.

## 2018-09-04 LAB — BASIC METABOLIC PANEL
BUN/Creatinine Ratio: 15 (ref 12–28)
BUN: 14 mg/dL (ref 8–27)
CO2: 22 mmol/L (ref 20–29)
Calcium: 10 mg/dL (ref 8.7–10.3)
Chloride: 104 mmol/L (ref 96–106)
Creatinine, Ser: 0.95 mg/dL (ref 0.57–1.00)
GFR calc non Af Amer: 63 mL/min/{1.73_m2} (ref >59)
GFR, EST AFRICAN AMERICAN: 72 mL/min/{1.73_m2} (ref >59)
Glucose: 91 mg/dL (ref 65–99)
Potassium: 5.2 mmol/L (ref 3.5–5.2)
Sodium: 140 mmol/L (ref 134–144)

## 2018-09-04 NOTE — Progress Notes (Signed)
Normal test results noted.  Please call patient and make them aware of normal results and will continue to monitor at regular visits.  Have a great day.  Look forward to seeing you at your next visit.

## 2018-10-17 DIAGNOSIS — F3340 Major depressive disorder, recurrent, in remission, unspecified: Secondary | ICD-10-CM | POA: Diagnosis not present

## 2018-11-12 DIAGNOSIS — M9903 Segmental and somatic dysfunction of lumbar region: Secondary | ICD-10-CM | POA: Diagnosis not present

## 2018-11-12 DIAGNOSIS — M5136 Other intervertebral disc degeneration, lumbar region: Secondary | ICD-10-CM | POA: Diagnosis not present

## 2018-11-12 DIAGNOSIS — M9901 Segmental and somatic dysfunction of cervical region: Secondary | ICD-10-CM | POA: Diagnosis not present

## 2018-11-12 DIAGNOSIS — M50322 Other cervical disc degeneration at C5-C6 level: Secondary | ICD-10-CM | POA: Diagnosis not present

## 2018-11-14 DIAGNOSIS — M5136 Other intervertebral disc degeneration, lumbar region: Secondary | ICD-10-CM | POA: Diagnosis not present

## 2018-11-14 DIAGNOSIS — M9901 Segmental and somatic dysfunction of cervical region: Secondary | ICD-10-CM | POA: Diagnosis not present

## 2018-11-14 DIAGNOSIS — M50322 Other cervical disc degeneration at C5-C6 level: Secondary | ICD-10-CM | POA: Diagnosis not present

## 2018-11-14 DIAGNOSIS — M9903 Segmental and somatic dysfunction of lumbar region: Secondary | ICD-10-CM | POA: Diagnosis not present

## 2018-11-18 DIAGNOSIS — M9901 Segmental and somatic dysfunction of cervical region: Secondary | ICD-10-CM | POA: Diagnosis not present

## 2018-11-18 DIAGNOSIS — M5136 Other intervertebral disc degeneration, lumbar region: Secondary | ICD-10-CM | POA: Diagnosis not present

## 2018-11-18 DIAGNOSIS — M9903 Segmental and somatic dysfunction of lumbar region: Secondary | ICD-10-CM | POA: Diagnosis not present

## 2018-11-18 DIAGNOSIS — M50322 Other cervical disc degeneration at C5-C6 level: Secondary | ICD-10-CM | POA: Diagnosis not present

## 2018-11-19 DIAGNOSIS — M50322 Other cervical disc degeneration at C5-C6 level: Secondary | ICD-10-CM | POA: Diagnosis not present

## 2018-11-19 DIAGNOSIS — M9903 Segmental and somatic dysfunction of lumbar region: Secondary | ICD-10-CM | POA: Diagnosis not present

## 2018-11-19 DIAGNOSIS — M5136 Other intervertebral disc degeneration, lumbar region: Secondary | ICD-10-CM | POA: Diagnosis not present

## 2018-11-19 DIAGNOSIS — M9901 Segmental and somatic dysfunction of cervical region: Secondary | ICD-10-CM | POA: Diagnosis not present

## 2018-11-21 DIAGNOSIS — M9901 Segmental and somatic dysfunction of cervical region: Secondary | ICD-10-CM | POA: Diagnosis not present

## 2018-11-21 DIAGNOSIS — M5136 Other intervertebral disc degeneration, lumbar region: Secondary | ICD-10-CM | POA: Diagnosis not present

## 2018-11-21 DIAGNOSIS — M50322 Other cervical disc degeneration at C5-C6 level: Secondary | ICD-10-CM | POA: Diagnosis not present

## 2018-11-21 DIAGNOSIS — M9903 Segmental and somatic dysfunction of lumbar region: Secondary | ICD-10-CM | POA: Diagnosis not present

## 2018-11-25 DIAGNOSIS — M5136 Other intervertebral disc degeneration, lumbar region: Secondary | ICD-10-CM | POA: Diagnosis not present

## 2018-11-25 DIAGNOSIS — M9901 Segmental and somatic dysfunction of cervical region: Secondary | ICD-10-CM | POA: Diagnosis not present

## 2018-11-25 DIAGNOSIS — M50322 Other cervical disc degeneration at C5-C6 level: Secondary | ICD-10-CM | POA: Diagnosis not present

## 2018-11-25 DIAGNOSIS — M9903 Segmental and somatic dysfunction of lumbar region: Secondary | ICD-10-CM | POA: Diagnosis not present

## 2018-11-26 DIAGNOSIS — M9901 Segmental and somatic dysfunction of cervical region: Secondary | ICD-10-CM | POA: Diagnosis not present

## 2018-11-26 DIAGNOSIS — M9903 Segmental and somatic dysfunction of lumbar region: Secondary | ICD-10-CM | POA: Diagnosis not present

## 2018-11-26 DIAGNOSIS — M5136 Other intervertebral disc degeneration, lumbar region: Secondary | ICD-10-CM | POA: Diagnosis not present

## 2018-11-26 DIAGNOSIS — M50322 Other cervical disc degeneration at C5-C6 level: Secondary | ICD-10-CM | POA: Diagnosis not present

## 2018-11-28 DIAGNOSIS — M9901 Segmental and somatic dysfunction of cervical region: Secondary | ICD-10-CM | POA: Diagnosis not present

## 2018-11-28 DIAGNOSIS — M9903 Segmental and somatic dysfunction of lumbar region: Secondary | ICD-10-CM | POA: Diagnosis not present

## 2018-11-28 DIAGNOSIS — M50322 Other cervical disc degeneration at C5-C6 level: Secondary | ICD-10-CM | POA: Diagnosis not present

## 2018-11-28 DIAGNOSIS — M5136 Other intervertebral disc degeneration, lumbar region: Secondary | ICD-10-CM | POA: Diagnosis not present

## 2018-12-03 DIAGNOSIS — M5136 Other intervertebral disc degeneration, lumbar region: Secondary | ICD-10-CM | POA: Diagnosis not present

## 2018-12-03 DIAGNOSIS — M9901 Segmental and somatic dysfunction of cervical region: Secondary | ICD-10-CM | POA: Diagnosis not present

## 2018-12-03 DIAGNOSIS — M50322 Other cervical disc degeneration at C5-C6 level: Secondary | ICD-10-CM | POA: Diagnosis not present

## 2018-12-03 DIAGNOSIS — M9903 Segmental and somatic dysfunction of lumbar region: Secondary | ICD-10-CM | POA: Diagnosis not present

## 2018-12-04 DIAGNOSIS — M9903 Segmental and somatic dysfunction of lumbar region: Secondary | ICD-10-CM | POA: Diagnosis not present

## 2018-12-04 DIAGNOSIS — M5136 Other intervertebral disc degeneration, lumbar region: Secondary | ICD-10-CM | POA: Diagnosis not present

## 2018-12-04 DIAGNOSIS — M9901 Segmental and somatic dysfunction of cervical region: Secondary | ICD-10-CM | POA: Diagnosis not present

## 2018-12-04 DIAGNOSIS — M50322 Other cervical disc degeneration at C5-C6 level: Secondary | ICD-10-CM | POA: Diagnosis not present

## 2018-12-05 DIAGNOSIS — M9901 Segmental and somatic dysfunction of cervical region: Secondary | ICD-10-CM | POA: Diagnosis not present

## 2018-12-05 DIAGNOSIS — M5136 Other intervertebral disc degeneration, lumbar region: Secondary | ICD-10-CM | POA: Diagnosis not present

## 2018-12-05 DIAGNOSIS — M50322 Other cervical disc degeneration at C5-C6 level: Secondary | ICD-10-CM | POA: Diagnosis not present

## 2018-12-05 DIAGNOSIS — M9903 Segmental and somatic dysfunction of lumbar region: Secondary | ICD-10-CM | POA: Diagnosis not present

## 2018-12-09 DIAGNOSIS — M50322 Other cervical disc degeneration at C5-C6 level: Secondary | ICD-10-CM | POA: Diagnosis not present

## 2018-12-09 DIAGNOSIS — M9903 Segmental and somatic dysfunction of lumbar region: Secondary | ICD-10-CM | POA: Diagnosis not present

## 2018-12-09 DIAGNOSIS — M5136 Other intervertebral disc degeneration, lumbar region: Secondary | ICD-10-CM | POA: Diagnosis not present

## 2018-12-09 DIAGNOSIS — M9901 Segmental and somatic dysfunction of cervical region: Secondary | ICD-10-CM | POA: Diagnosis not present

## 2018-12-10 DIAGNOSIS — M9901 Segmental and somatic dysfunction of cervical region: Secondary | ICD-10-CM | POA: Diagnosis not present

## 2018-12-10 DIAGNOSIS — M9903 Segmental and somatic dysfunction of lumbar region: Secondary | ICD-10-CM | POA: Diagnosis not present

## 2018-12-10 DIAGNOSIS — M50322 Other cervical disc degeneration at C5-C6 level: Secondary | ICD-10-CM | POA: Diagnosis not present

## 2018-12-10 DIAGNOSIS — M5136 Other intervertebral disc degeneration, lumbar region: Secondary | ICD-10-CM | POA: Diagnosis not present

## 2018-12-12 DIAGNOSIS — M50322 Other cervical disc degeneration at C5-C6 level: Secondary | ICD-10-CM | POA: Diagnosis not present

## 2018-12-12 DIAGNOSIS — M9903 Segmental and somatic dysfunction of lumbar region: Secondary | ICD-10-CM | POA: Diagnosis not present

## 2018-12-12 DIAGNOSIS — M9901 Segmental and somatic dysfunction of cervical region: Secondary | ICD-10-CM | POA: Diagnosis not present

## 2018-12-12 DIAGNOSIS — M5136 Other intervertebral disc degeneration, lumbar region: Secondary | ICD-10-CM | POA: Diagnosis not present

## 2018-12-16 DIAGNOSIS — M5136 Other intervertebral disc degeneration, lumbar region: Secondary | ICD-10-CM | POA: Diagnosis not present

## 2018-12-16 DIAGNOSIS — M9901 Segmental and somatic dysfunction of cervical region: Secondary | ICD-10-CM | POA: Diagnosis not present

## 2018-12-16 DIAGNOSIS — M9903 Segmental and somatic dysfunction of lumbar region: Secondary | ICD-10-CM | POA: Diagnosis not present

## 2018-12-16 DIAGNOSIS — M50322 Other cervical disc degeneration at C5-C6 level: Secondary | ICD-10-CM | POA: Diagnosis not present

## 2018-12-19 DIAGNOSIS — M50322 Other cervical disc degeneration at C5-C6 level: Secondary | ICD-10-CM | POA: Diagnosis not present

## 2018-12-19 DIAGNOSIS — M5136 Other intervertebral disc degeneration, lumbar region: Secondary | ICD-10-CM | POA: Diagnosis not present

## 2018-12-19 DIAGNOSIS — M9901 Segmental and somatic dysfunction of cervical region: Secondary | ICD-10-CM | POA: Diagnosis not present

## 2018-12-19 DIAGNOSIS — M9903 Segmental and somatic dysfunction of lumbar region: Secondary | ICD-10-CM | POA: Diagnosis not present

## 2018-12-23 DIAGNOSIS — M5136 Other intervertebral disc degeneration, lumbar region: Secondary | ICD-10-CM | POA: Diagnosis not present

## 2018-12-23 DIAGNOSIS — M9903 Segmental and somatic dysfunction of lumbar region: Secondary | ICD-10-CM | POA: Diagnosis not present

## 2018-12-23 DIAGNOSIS — M50322 Other cervical disc degeneration at C5-C6 level: Secondary | ICD-10-CM | POA: Diagnosis not present

## 2018-12-23 DIAGNOSIS — M9901 Segmental and somatic dysfunction of cervical region: Secondary | ICD-10-CM | POA: Diagnosis not present

## 2018-12-26 DIAGNOSIS — M9901 Segmental and somatic dysfunction of cervical region: Secondary | ICD-10-CM | POA: Diagnosis not present

## 2018-12-26 DIAGNOSIS — M9903 Segmental and somatic dysfunction of lumbar region: Secondary | ICD-10-CM | POA: Diagnosis not present

## 2018-12-26 DIAGNOSIS — M50322 Other cervical disc degeneration at C5-C6 level: Secondary | ICD-10-CM | POA: Diagnosis not present

## 2018-12-26 DIAGNOSIS — M5136 Other intervertebral disc degeneration, lumbar region: Secondary | ICD-10-CM | POA: Diagnosis not present

## 2018-12-30 DIAGNOSIS — M9903 Segmental and somatic dysfunction of lumbar region: Secondary | ICD-10-CM | POA: Diagnosis not present

## 2018-12-30 DIAGNOSIS — M9901 Segmental and somatic dysfunction of cervical region: Secondary | ICD-10-CM | POA: Diagnosis not present

## 2018-12-30 DIAGNOSIS — M5136 Other intervertebral disc degeneration, lumbar region: Secondary | ICD-10-CM | POA: Diagnosis not present

## 2018-12-30 DIAGNOSIS — M50322 Other cervical disc degeneration at C5-C6 level: Secondary | ICD-10-CM | POA: Diagnosis not present

## 2019-01-02 DIAGNOSIS — M9901 Segmental and somatic dysfunction of cervical region: Secondary | ICD-10-CM | POA: Diagnosis not present

## 2019-01-02 DIAGNOSIS — M9903 Segmental and somatic dysfunction of lumbar region: Secondary | ICD-10-CM | POA: Diagnosis not present

## 2019-01-02 DIAGNOSIS — M5136 Other intervertebral disc degeneration, lumbar region: Secondary | ICD-10-CM | POA: Diagnosis not present

## 2019-01-02 DIAGNOSIS — M50322 Other cervical disc degeneration at C5-C6 level: Secondary | ICD-10-CM | POA: Diagnosis not present

## 2019-01-06 DIAGNOSIS — M9903 Segmental and somatic dysfunction of lumbar region: Secondary | ICD-10-CM | POA: Diagnosis not present

## 2019-01-06 DIAGNOSIS — M9901 Segmental and somatic dysfunction of cervical region: Secondary | ICD-10-CM | POA: Diagnosis not present

## 2019-01-06 DIAGNOSIS — M50322 Other cervical disc degeneration at C5-C6 level: Secondary | ICD-10-CM | POA: Diagnosis not present

## 2019-01-06 DIAGNOSIS — M5136 Other intervertebral disc degeneration, lumbar region: Secondary | ICD-10-CM | POA: Diagnosis not present

## 2019-01-09 DIAGNOSIS — M9903 Segmental and somatic dysfunction of lumbar region: Secondary | ICD-10-CM | POA: Diagnosis not present

## 2019-01-09 DIAGNOSIS — M9901 Segmental and somatic dysfunction of cervical region: Secondary | ICD-10-CM | POA: Diagnosis not present

## 2019-01-09 DIAGNOSIS — M50322 Other cervical disc degeneration at C5-C6 level: Secondary | ICD-10-CM | POA: Diagnosis not present

## 2019-01-09 DIAGNOSIS — M5136 Other intervertebral disc degeneration, lumbar region: Secondary | ICD-10-CM | POA: Diagnosis not present

## 2019-01-14 DIAGNOSIS — M9901 Segmental and somatic dysfunction of cervical region: Secondary | ICD-10-CM | POA: Diagnosis not present

## 2019-01-14 DIAGNOSIS — M50322 Other cervical disc degeneration at C5-C6 level: Secondary | ICD-10-CM | POA: Diagnosis not present

## 2019-01-14 DIAGNOSIS — M9903 Segmental and somatic dysfunction of lumbar region: Secondary | ICD-10-CM | POA: Diagnosis not present

## 2019-01-14 DIAGNOSIS — M5136 Other intervertebral disc degeneration, lumbar region: Secondary | ICD-10-CM | POA: Diagnosis not present

## 2019-01-16 DIAGNOSIS — M9901 Segmental and somatic dysfunction of cervical region: Secondary | ICD-10-CM | POA: Diagnosis not present

## 2019-01-16 DIAGNOSIS — M50322 Other cervical disc degeneration at C5-C6 level: Secondary | ICD-10-CM | POA: Diagnosis not present

## 2019-01-16 DIAGNOSIS — M5136 Other intervertebral disc degeneration, lumbar region: Secondary | ICD-10-CM | POA: Diagnosis not present

## 2019-01-16 DIAGNOSIS — M9903 Segmental and somatic dysfunction of lumbar region: Secondary | ICD-10-CM | POA: Diagnosis not present

## 2019-01-23 ENCOUNTER — Ambulatory Visit (INDEPENDENT_AMBULATORY_CARE_PROVIDER_SITE_OTHER): Payer: Medicare Other

## 2019-01-23 DIAGNOSIS — Z0000 Encounter for general adult medical examination without abnormal findings: Secondary | ICD-10-CM

## 2019-01-23 NOTE — Patient Instructions (Signed)
Michelle Espinoza , Thank you for taking time to come for your Medicare Wellness Visit. I appreciate your ongoing commitment to your health goals. Please review the following plan we discussed and let me know if I can assist you in the future.   Screening recommendations/referrals: Colonoscopy: cologuard completed 2018, due 2021 Mammogram: completed 05/21/2018, due 05/2019 Bone Density: completed 04/09/2017 Recommended yearly ophthalmology/optometry visit for glaucoma screening and checkup Recommended yearly dental visit for hygiene and checkup  Vaccinations: Influenza vaccine: up to date Pneumococcal vaccine: up to date Tdap vaccine: up to date Shingles vaccine: check on 2nd dose of shingrix vaccine with your pharmacy  Advanced directives: please let us know if you have any questions or need assistance with this paperwork   Conditions/risks identified: smoking cessation discussed- not ready to quit  Next appointment: follow up in one year for your annual wellness exam.    Preventive Care 62 Years and Older, Female Preventive care refers to lifestyle choices and visits with your health care provider that can promote health and wellness. What does preventive care include?  A yearly physical exam. This is also called an annual well check.  Dental exams once or twice a year.  Routine eye exams. Ask your health care provider how often you should have your eyes checked.  Personal lifestyle choices, including:  Daily care of your teeth and gums.  Regular physical activity.  Eating a healthy diet.  Avoiding tobacco and drug use.  Limiting alcohol use.  Practicing safe sex.  Taking low-dose aspirin every day.  Taking vitamin and mineral supplements as recommended by your health care provider. What happens during an annual well check? The services and screenings done by your health care provider during your annual well check will depend on your age, overall health, lifestyle risk  factors, and family history of disease. Counseling  Your health care provider may ask you questions about your:  Alcohol use.  Tobacco use.  Drug use.  Emotional well-being.  Home and relationship well-being.  Sexual activity.  Eating habits.  History of falls.  Memory and ability to understand (cognition).  Work and work Statistician.  Reproductive health. Screening  You may have the following tests or measurements:  Height, weight, and BMI.  Blood pressure.  Lipid and cholesterol levels. These may be checked every 5 years, or more frequently if you are over 59 years old.  Skin check.  Lung cancer screening. You may have this screening every year starting at age 33 if you have a 30-pack-year history of smoking and currently smoke or have quit within the past 15 years.  Fecal occult blood test (FOBT) of the stool. You may have this test every year starting at age 33.  Flexible sigmoidoscopy or colonoscopy. You may have a sigmoidoscopy every 5 years or a colonoscopy every 10 years starting at age 73.  Hepatitis C blood test.  Hepatitis B blood test.  Sexually transmitted disease (STD) testing.  Diabetes screening. This is done by checking your blood sugar (glucose) after you have not eaten for a while (fasting). You may have this done every 1-3 years.  Bone density scan. This is done to screen for osteoporosis. You may have this done starting at age 28.  Mammogram. This may be done every 1-2 years. Talk to your health care provider about how often you should have regular mammograms. Talk with your health care provider about your test results, treatment options, and if necessary, the need for more tests. Vaccines  Your  health care provider may recommend certain vaccines, such as:  Influenza vaccine. This is recommended every year.  Tetanus, diphtheria, and acellular pertussis (Tdap, Td) vaccine. You may need a Td booster every 10 years.  Zoster vaccine. You  may need this after age 15.  Pneumococcal 13-valent conjugate (PCV13) vaccine. One dose is recommended after age 74.  Pneumococcal polysaccharide (PPSV23) vaccine. One dose is recommended after age 74. Talk to your health care provider about which screenings and vaccines you need and how often you need them. This information is not intended to replace advice given to you by your health care provider. Make sure you discuss any questions you have with your health care provider. Document Released: 07/23/2015 Document Revised: 03/15/2016 Document Reviewed: 04/27/2015 Elsevier Interactive Patient Education  2017 [employer] Prevention in the Home Falls can cause injuries. They can happen to people of all ages. There are many things you can do to make your home safe and to help prevent falls. What can I do on the outside of my home?  Regularly fix the edges of walkways and driveways and fix any cracks.  Remove anything that might make you trip as you walk through a door, such as a raised step or threshold.  Trim any bushes or trees on the path to your home.  Use bright outdoor lighting.  Clear any walking paths of anything that might make someone trip, such as rocks or tools.  Regularly check to see if handrails are loose or broken. Make sure that both sides of any steps have handrails.  Any raised decks and porches should have guardrails on the edges.  Have any leaves, snow, or ice cleared regularly.  Use sand or salt on walking paths during winter.  Clean up any spills in your garage right away. This includes oil or grease spills. What can I do in the bathroom?  Use night lights.  Install grab bars by the toilet and in the tub and shower. Do not use towel bars as grab bars.  Use non-skid mats or decals in the tub or shower.  If you need to sit down in the shower, use a plastic, non-slip stool.  Keep the floor dry. Clean up any water that spills on the floor as soon as  it happens.  Remove soap buildup in the tub or shower regularly.  Attach bath mats securely with double-sided non-slip rug tape.  Do not have throw rugs and other things on the floor that can make you trip. What can I do in the bedroom?  Use night lights.  Make sure that you have a light by your bed that is easy to reach.  Do not use any sheets or blankets that are too big for your bed. They should not hang down onto the floor.  Have a firm chair that has side arms. You can use this for support while you get dressed.  Do not have throw rugs and other things on the floor that can make you trip. What can I do in the kitchen?  Clean up any spills right away.  Avoid walking on wet floors.  Keep items that you use a lot in easy-to-reach places.  If you need to reach something above you, use a strong step stool that has a grab bar.  Keep electrical cords out of the way.  Do not use floor polish or wax that makes floors slippery. If you must use wax, use non-skid floor wax.  Do not  have throw rugs and other things on the floor that can make you trip. What can I do with my stairs?  Do not leave any items on the stairs.  Make sure that there are handrails on both sides of the stairs and use them. Fix handrails that are broken or loose. Make sure that handrails are as long as the stairways.  Check any carpeting to make sure that it is firmly attached to the stairs. Fix any carpet that is loose or worn.  Avoid having throw rugs at the top or bottom of the stairs. If you do have throw rugs, attach them to the floor with carpet tape.  Make sure that you have a light switch at the top of the stairs and the bottom of the stairs. If you do not have them, ask someone to add them for you. What else can I do to help prevent falls?  Wear shoes that:  Do not have high heels.  Have rubber bottoms.  Are comfortable and fit you well.  Are closed at the toe. Do not wear sandals.  If  you use a stepladder:  Make sure that it is fully opened. Do not climb a closed stepladder.  Make sure that both sides of the stepladder are locked into place.  Ask someone to hold it for you, if possible.  Clearly mark and make sure that you can see:  Any grab bars or handrails.  First and last steps.  Where the edge of each step is.  Use tools that help you move around (mobility aids) if they are needed. These include:  Canes.  Walkers.  Scooters.  Crutches.  Turn on the lights when you go into a dark area. Replace any light bulbs as soon as they burn out.  Set up your furniture so you have a clear path. Avoid moving your furniture around.  If any of your floors are uneven, fix them.  If there are any pets around you, be aware of where they are.  Review your medicines with your doctor. Some medicines can make you feel dizzy. This can increase your chance of falling. Ask your doctor what other things that you can do to help prevent falls. This information is not intended to replace advice given to you by your health care provider. Make sure you discuss any questions you have with your health care provider. Document Released: 04/22/2009 Document Revised: 12/02/2015 Document Reviewed: 07/31/2014 Elsevier Interactive Patient Education  2017 [employer].

## 2019-01-23 NOTE — Progress Notes (Signed)
Subjective:   Michelle Espinoza is a 68 y.o. female who presents for Medicare Annual (Subsequent) preventive examination.  This visit is being conducted via phone call  - after an attmept to do on video chat - due to the COVID-19 pandemic. This patient has given me verbal consent via phone to conduct this visit, patient states they are participating from their home address. Some vital signs may be absent or patient reported.   Patient identification: identified by name, DOB, and current address.    Review of Systems:   Cardiac Risk Factors include: advanced age (>2men, >53 women);dyslipidemia;hypertension;smoking/ tobacco exposure     Objective:     Vitals: There were no vitals taken for this visit.  There is no height or weight on file to calculate BMI.  Advanced Directives 01/23/2019 07/01/2018 01/18/2018  Does Patient Have a Medical Advance Directive? No No No  Would patient like information on creating a medical advance directive? - No - Patient declined Yes (MAU/Ambulatory/Procedural Areas - Information given)    Tobacco Social History   Tobacco Use  Smoking Status Current Every Day Smoker  . Packs/day: 0.50  . Years: 49.00  . Pack years: 24.50  . Types: Cigarettes  Smokeless Tobacco Never Used     Ready to quit: No Counseling given: Yes   Clinical Intake:  Pre-visit preparation completed: Yes  Pain : No/denies pain     Nutritional Risks: None Diabetes: No  How often do you need to have someone help you when you read instructions, pamphlets, or other written materials from your doctor or pharmacy?: 1 - Never  Interpreter Needed?: No  Information entered by :: Penne Rosenstock,LPN  Past Medical History:  Diagnosis Date  . Allergy   . Depression   . Essential hypertension   . Hyperlipidemia   . Restless legs    Past Surgical History:  Procedure Laterality Date  . ABDOMINAL HYSTERECTOMY     Family History  Adopted: Yes   Social History    Socioeconomic History  . Marital status: Widowed    Spouse name: Not on file  . Number of children: Not on file  . Years of education: 51  . Highest education level: High school graduate  Occupational History  . Occupation: retired  Scientific laboratory technician  . Financial resource strain: Not hard at all  . Food insecurity    Worry: Never true    Inability: Never true  . Transportation needs    Medical: No    Non-medical: No  Tobacco Use  . Smoking status: Current Every Day Smoker    Packs/day: 0.50    Years: 49.00    Pack years: 24.50    Types: Cigarettes  . Smokeless tobacco: Never Used  Substance and Sexual Activity  . Alcohol use: Yes    Comment: occasionally  . Drug use: No  . Sexual activity: Not on file  Lifestyle  . Physical activity    Days per week: 0 days    Minutes per session: 0 min  . Stress: Not at all  Relationships  . Social connections    Talks on phone: More than three times a week    Gets together: More than three times a week    Attends religious service: More than 4 times per year    Active member of club or organization: Yes    Attends meetings of clubs or organizations: More than 4 times per year    Relationship status: Widowed  Other Topics Concern  .  Not on file  Social History Narrative  . Not on file    Outpatient Encounter Medications as of 01/23/2019  Medication Sig  . aspirin EC 81 MG tablet Take 81 mg by mouth daily.  Marland Kitchen ezetimibe (ZETIA) 10 MG tablet Take 1 tablet (10 mg total) by mouth daily.  Marland Kitchen FLUoxetine (PROZAC) 40 MG capsule Take 40 mg by mouth daily.   Marland Kitchen gabapentin (NEURONTIN) 300 MG capsule Take 1 capsule (300 mg total) by mouth daily. Start 1 bedtime for 1 week then 2 bedtime for 1 week and if needed 2 bedtime and 1 in morning  . hydrochlorothiazide (HYDRODIURIL) 12.5 MG tablet TAKE 1 TABLET BY MOUTH EVERY DAY WITH LOSARTAN  . losartan (COZAAR) 100 MG tablet TAKE 1 TABLET BY MOUTH EVERY DAY WITH HCTZ  . Multiple Vitamin (MULTIVITAMIN)  LIQD Take 5 mLs by mouth daily.  . pravastatin (PRAVACHOL) 20 MG tablet Take 1 tablet (20 mg total) by mouth daily.  Marland Kitchen omeprazole (PRILOSEC) 20 MG capsule TAKE 1 CAPSULE BY MOUTH EVERY DAY (Patient not taking: Reported on 01/23/2019)   No facility-administered encounter medications on file as of 01/23/2019.     Activities of Daily Living In your present state of health, do you have any difficulty performing the following activities: 01/23/2019  Hearing? N  Vision? N  Difficulty concentrating or making decisions? N  Walking or climbing stairs? N  Dressing or bathing? N  Doing errands, shopping? N  Preparing Food and eating ? N  Using the Toilet? N  In the past six months, have you accidently leaked urine? N  Do you have problems with loss of bowel control? N  Managing your Medications? N  Managing your Finances? N  Housekeeping or managing your Housekeeping? N  Some recent data might be hidden    Patient Care Team: Guadalupe Maple, MD as PCP - General (Family Medicine) Chauncey Mann, MD as Referring Physician (Psychiatry)    Assessment:   This is a routine wellness examination for Michelle Espinoza.  Exercise Activities and Dietary recommendations Current Exercise Habits: The patient does not participate in regular exercise at present, Exercise limited by: None identified  Goals    . Quit Smoking     Smoking cessation discussed       Fall Risk: Fall Risk  01/23/2019 01/18/2018 07/17/2017 01/09/2017  Falls in the past year? 0 No No No    FALL RISK PREVENTION PERTAINING TO THE HOME:  Any stairs in or around the home? No  If so, are there any without handrails? No   Home free of loose throw rugs in walkways, pet beds, electrical cords, etc? Yes  Adequate lighting in your home to reduce risk of falls? Yes   ASSISTIVE DEVICES UTILIZED TO PREVENT FALLS:  Life alert? No  Use of a cane, walker or w/c? No  Grab bars in the bathroom? No  Shower chair or bench in shower? No  Elevated  toilet seat or a handicapped toilet? No   DME ORDERS:  DME order needed?  No   TIMED UP AND GO:  Unable to perform    Depression Screen PHQ 2/9 Scores 01/23/2019 07/30/2018 01/18/2018 07/17/2017  PHQ - 2 Score 0 0 0 0  PHQ- 9 Score - 0 - -     Cognitive Function     6CIT Screen 01/23/2019 01/18/2018  What Year? 0 points 0 points  What month? 0 points 0 points  What time? 0 points 0 points  Count back from  20 0 points 0 points  Months in reverse 0 points 0 points  Repeat phrase 0 points 0 points  Total Score 0 0    Immunization History  Administered Date(s) Administered  . Influenza, High Dose Seasonal PF 06/11/2017  . Influenza,inj,Quad PF,6+ Mos 06/17/2015, 05/29/2016, 03/07/2018  . Pneumococcal Conjugate-13 06/11/2018  . Pneumococcal Polysaccharide-23 06/11/2017  . Td 12/13/2006, 01/09/2017  . Zoster 07/07/2013  . Zoster Recombinat (Shingrix) 03/13/2018    Qualifies for Shingles Vaccine? Completed first shingrix dose  Tdap: up to date   Flu Vaccine: up to date   Pneumococcal Vaccine: up to date    Screening Tests Health Maintenance  Topic Date Due  . INFLUENZA VACCINE  02/08/2019  . Fecal DNA (Cologuard)  03/07/2020  . MAMMOGRAM  05/21/2020  . TETANUS/TDAP  01/10/2027  . DEXA SCAN  Completed  . Hepatitis C Screening  Completed  . PNA vac Low Risk Adult  Completed    Cancer Screenings:  Colorectal Screening: Completedcologuard  03/07/2017 Repeat every 3 years  Mammogram: Completed 05/21/2018. Repeat every year  Bone Density: Completed 04/09/2017.  Lung Cancer Screening: (Low Dose CT Chest recommended if Age 47-80 years, 30 pack-year currently smoking OR have quit w/in 15years.) does qualify.  02/14/2018   Additional Screening:  Hepatitis C Screening: does qualify; Completed 11/26/2015  Vision Screening: Recommended annual ophthalmology exams for early detection of glaucoma and other disorders of the eye. Is the patient up to date with their annual  eye exam?  No  Who is the provider or what is the name of the office in which the pt attends annual eye exams? [facility]    Dental Screening: Recommended annual dental exams for proper oral hygiene  Community Resource Referral:  CRR required this visit?  No       Plan:  I have personally reviewed and addressed the Medicare Annual Wellness questionnaire and have noted the following in the patient's chart:  A. Medical and social history B. Use of alcohol, tobacco or illicit drugs  C. Current medications and supplements D. Functional ability and status E.  Nutritional status F.  Physical activity G. Advance directives H. List of other physicians I.  Hospitalizations, surgeries, and ER visits in previous 12 months J.  [place] such as hearing and vision if needed, cognitive and depression L. Referrals and appointments   In addition, I have reviewed and discussed with patient certain preventive protocols, quality metrics, and best practice recommendations. A written personalized care plan for preventive services as well as general preventive health recommendations were provided to patient. Nurse Health Advisor  Signed,    [place], Caro Hight, [place]  6/80/3212 Nurse Health Advisor   Nurse Notes: none

## 2019-01-30 ENCOUNTER — Encounter: Payer: Self-pay | Admitting: Family Medicine

## 2019-01-30 ENCOUNTER — Ambulatory Visit (INDEPENDENT_AMBULATORY_CARE_PROVIDER_SITE_OTHER): Payer: Medicare Other | Admitting: Family Medicine

## 2019-01-30 ENCOUNTER — Other Ambulatory Visit: Payer: Self-pay

## 2019-01-30 DIAGNOSIS — F3342 Major depressive disorder, recurrent, in full remission: Secondary | ICD-10-CM

## 2019-01-30 DIAGNOSIS — Z7189 Other specified counseling: Secondary | ICD-10-CM

## 2019-01-30 DIAGNOSIS — E782 Mixed hyperlipidemia: Secondary | ICD-10-CM | POA: Diagnosis not present

## 2019-01-30 DIAGNOSIS — I10 Essential (primary) hypertension: Secondary | ICD-10-CM

## 2019-01-30 DIAGNOSIS — G2581 Restless legs syndrome: Secondary | ICD-10-CM

## 2019-01-30 MED ORDER — PRAVASTATIN SODIUM 20 MG PO TABS: 20.0000 mg | ORAL_TABLET | Freq: Every day | ORAL | 4 refills | Status: DC

## 2019-01-30 MED ORDER — GABAPENTIN 300 MG PO CAPS: 300.0000 mg | ORAL_CAPSULE | Freq: Every day | ORAL | 2 refills | Status: DC

## 2019-01-30 MED ORDER — LOSARTAN POTASSIUM 100 MG PO TABS: 100.0000 mg | ORAL_TABLET | Freq: Every day | ORAL | 4 refills | Status: DC

## 2019-01-30 MED ORDER — EZETIMIBE 10 MG PO TABS: 10.0000 mg | ORAL_TABLET | Freq: Every day | ORAL | 4 refills | Status: DC

## 2019-01-30 MED ORDER — HYDROCHLOROTHIAZIDE 12.5 MG PO TABS: 12.5000 mg | ORAL_TABLET | Freq: Every day | ORAL | 4 refills | Status: DC

## 2019-01-30 NOTE — Assessment & Plan Note (Signed)
A voluntary discussion about advanced care planning including explanation and discussion of advanced directives was extentively discussed with the patient.  Explained about the healthcare proxy and living will was reviewed and packet with forms with expiration of how to fill them out was given.  Time spent: Encounter 16+ min individuals present: Patient 

## 2019-01-30 NOTE — Assessment & Plan Note (Signed)
Takes gabapentin on a as needed basis with fair control

## 2019-01-30 NOTE — Progress Notes (Addendum)
BP 118/87   Wt 167 lb (75.8 kg)   BMI 29.58 kg/m    Subjective:    Patient ID: Michelle Espinoza, female    DOB: 1950/11/11, 68 y.o.   MRN: [id number]  HPI: Michelle Espinoza is a 68 y.o. female  Med check Discussed with patient good control of blood pressure no complaints from medications. Cholesterol also doing well no complaints. Depression stable followed by psychiatry with good control. Restless legs takes gabapentin on a as needed basis with no issues. Discussed advance care planning  Relevant past medical, surgical, family and social history reviewed and updated as indicated. Interim medical history since our last visit reviewed. Allergies and medications reviewed and updated.  Review of Systems  Constitutional: Negative.   HENT: Negative.   Eyes: Negative.   Respiratory: Negative.   Cardiovascular: Negative.   Gastrointestinal: Negative.   Endocrine: Negative.   Genitourinary: Negative.   Musculoskeletal: Negative.   Skin: Negative.   Allergic/Immunologic: Negative.   Neurological: Negative.   Hematological: Negative.   Psychiatric/Behavioral: Negative.     Per HPI unless specifically indicated above     Objective:    BP 118/87   Wt 167 lb (75.8 kg)   BMI 29.58 kg/m   Wt Readings from Last 3 Encounters:  01/30/19 167 lb (75.8 kg)  09/03/18 168 lb 4 oz (76.3 kg)  07/30/18 170 lb 3.2 oz (77.2 kg)    Physical Exam  Results for orders placed or performed in visit on 76/54/65  Basic Metabolic Panel (BMET)  Result Value Ref Range   Glucose 91 65 - 99 mg/dL   BUN 14 8 - 27 mg/dL   Creatinine, Ser 0.95 0.57 - 1.00 mg/dL   GFR calc non Af Amer 63 >59 mL/min/1.73   GFR calc Af Amer 72 >59 mL/min/1.73   BUN/Creatinine Ratio 15 12 - 28   Sodium 140 134 - 144 mmol/L   Potassium 5.2 3.5 - 5.2 mmol/L   Chloride 104 96 - 106 mmol/L   CO2 22 20 - 29 mmol/L   Calcium 10.0 8.7 - 10.3 mg/dL      Assessment & Plan:   Problem List Items Addressed This Visit      Cardiovascular and Mediastinum   Essential hypertension    The current medical regimen is effective;  continue present plan and medications.         Other   Hyperlipidemia    The current medical regimen is effective;  continue present plan and medications.       Restless legs    Takes gabapentin on a as needed basis with fair control      Advanced care planning/counseling discussion    A voluntary discussion about advanced care planning including explanation and discussion of advanced directives was extentively discussed with the patient.  Explained about the healthcare proxy and living will was reviewed and packet with forms with expiration of how to fill them out was given.  Time spent: Encounter 16+ min individuals present: Patient      Recurrent major depressive disorder, in full remission ([place])    Stable and followed by psychiatry        Telemedicine using audio/video telecommunications for a synchronous communication visit. Today's visit due to COVID-19 isolation precautions I connected with and verified that I am speaking with the correct person using two identifiers.   I discussed the limitations, risks, security and privacy concerns of performing an evaluation and management service by telecommunication and the  availability of in person appointments. I also discussed with the patient that there may be a patient responsible charge related to this service. The patient expressed understanding and agreed to proceed. The patient's location is home. I am at home.   I discussed the assessment and treatment plan with the patient. The patient was provided an opportunity to ask questions and all were answered. The patient agreed with the plan and demonstrated an understanding of the instructions.   The patient was advised to call back or seek an in-person evaluation if the symptoms worsen or if the condition fails to improve as anticipated.   I provided 21+ minutes of time  during this encounter.  Follow up plan: Return for Physical exam soon and then routine follow-up 6 months.

## 2019-01-30 NOTE — Assessment & Plan Note (Signed)
The current medical regimen is effective;  continue present plan and medications.  

## 2019-01-30 NOTE — Assessment & Plan Note (Signed)
Stable and followed by psychiatry

## 2019-02-21 ENCOUNTER — Telehealth: Payer: Self-pay | Admitting: *Deleted

## 2019-02-21 NOTE — Telephone Encounter (Signed)
Left message for patient to notify them that it is time to schedule annual low dose lung cancer screening CT scan. Instructed patient to call back to verify information prior to the scan being scheduled.  

## 2019-02-28 DIAGNOSIS — F3340 Major depressive disorder, recurrent, in remission, unspecified: Secondary | ICD-10-CM | POA: Diagnosis not present

## 2019-03-11 ENCOUNTER — Encounter: Payer: BLUE CROSS/BLUE SHIELD | Admitting: Nurse Practitioner

## 2019-03-14 ENCOUNTER — Telehealth: Payer: Self-pay | Admitting: Family Medicine

## 2019-03-14 MED ORDER — MELOXICAM 15 MG PO TABS: 15.0000 mg | ORAL_TABLET | Freq: Every day | ORAL | 3 refills | Status: DC

## 2019-03-14 NOTE — Telephone Encounter (Signed)
Routing to provider  

## 2019-03-14 NOTE — Telephone Encounter (Signed)
Pt says that she has hip pain. pt says that her Chiropractor suggested to request a Anti-inflammatory medication from her PCP to help. Pt says that she was planning to ask the provider at her apt but her apt was rescheduled to a later date.   Please assist.    Pharmacy:  [id number] - [place], [place] [phone] (Phone) [phone] (Fax)

## 2019-04-10 ENCOUNTER — Other Ambulatory Visit: Payer: Self-pay

## 2019-04-10 DIAGNOSIS — Z20822 Contact with and (suspected) exposure to covid-19: Secondary | ICD-10-CM

## 2019-04-10 DIAGNOSIS — Z20828 Contact with and (suspected) exposure to other viral communicable diseases: Secondary | ICD-10-CM | POA: Diagnosis not present

## 2019-04-11 ENCOUNTER — Encounter: Payer: Self-pay | Admitting: *Deleted

## 2019-04-11 LAB — NOVEL CORONAVIRUS, NAA: SARS-CoV-2, NAA: NOT DETECTED

## 2019-05-06 ENCOUNTER — Encounter: Payer: BLUE CROSS/BLUE SHIELD | Admitting: Nurse Practitioner

## 2019-05-09 ENCOUNTER — Other Ambulatory Visit: Payer: Self-pay

## 2019-05-09 ENCOUNTER — Ambulatory Visit (INDEPENDENT_AMBULATORY_CARE_PROVIDER_SITE_OTHER): Payer: Medicare Other | Admitting: Nurse Practitioner

## 2019-05-09 ENCOUNTER — Encounter: Payer: Self-pay | Admitting: Nurse Practitioner

## 2019-05-09 VITALS — BP 115/69 | HR 85 | Temp 99.4°F | Ht 63.0 in | Wt 171.0 lb

## 2019-05-09 DIAGNOSIS — Z23 Encounter for immunization: Secondary | ICD-10-CM

## 2019-05-09 DIAGNOSIS — F3342 Major depressive disorder, recurrent, in full remission: Secondary | ICD-10-CM | POA: Diagnosis not present

## 2019-05-09 DIAGNOSIS — E782 Mixed hyperlipidemia: Secondary | ICD-10-CM

## 2019-05-09 DIAGNOSIS — G2581 Restless legs syndrome: Secondary | ICD-10-CM

## 2019-05-09 DIAGNOSIS — I10 Essential (primary) hypertension: Secondary | ICD-10-CM | POA: Diagnosis not present

## 2019-05-09 DIAGNOSIS — Z0000 Encounter for general adult medical examination without abnormal findings: Secondary | ICD-10-CM

## 2019-05-09 DIAGNOSIS — Z1231 Encounter for screening mammogram for malignant neoplasm of breast: Secondary | ICD-10-CM | POA: Diagnosis not present

## 2019-05-09 NOTE — Progress Notes (Addendum)
BP 115/69   Pulse 85   Temp 99.4 F (37.4 C) (Oral)   Ht 5\' 3"  (1.6 m)   Wt 171 lb (77.6 kg)   SpO2 97%   BMI 30.29 kg/m    Subjective:    Patient ID: Michelle Espinoza, female    DOB: [birth year], 68 y.o.   MRN: [id number]  HPI: Michelle Espinoza is a 68 y.o. female presenting on 05/09/2019 for comprehensive medical examination. Current medical complaints include:none  She currently lives with: husband Menopausal Symptoms: no   HYPERTENSION / HYPERLIPIDEMIA Continues on Zetia, ASA, Losartan-HCTZ, and Pravastatin. Satisfied with current treatment? yes Duration of hypertension: chronic BP monitoring frequency: rarely BP range:  BP medication side effects: no Duration of hyperlipidemia: chronic Cholesterol medication side effects: no Cholesterol supplements: none Medication compliance: good compliance Aspirin: yes Recent stressors: no Recurrent headaches: no Visual changes: no Palpitations: no Dyspnea: no Chest pain: no Lower extremity edema: no Dizzy/lightheaded: no   RESTLESS LEG: Continues on Gabapentin 300 MG daily, which she reports appears to help her.  Had a sleep study and they noticed her legs moved "a crazy amount" during night.    DEPRESSION She is followed by psychiatry and continues on Prozac.   Mood status: stable Satisfied with current treatment?: yes Symptom severity: mild  Duration of current treatment : chronic Side effects: no Medication compliance: good compliance Psychotherapy/counseling: none Depressed mood: no Anxious mood: no Anhedonia: no Significant weight loss or gain: no Insomnia: no  none Fatigue: no Feelings of worthlessness or guilt: no Impaired concentration/indecisiveness: no Suicidal ideations: no Hopelessness: no Crying spells: no Depression screen [facility] 2/9 11/07/2019 05/09/2019 01/23/2019 07/30/2018 01/18/2018  Decreased Interest 0 0 0 0 0  Down, Depressed, Hopeless 0 0 0 0 0  PHQ - 2 Score 0 0 0 0 0  Altered sleeping 1 0 -  0 -  Tired, decreased energy 1 0 - 0 -  Change in appetite 0 0 - 0 -  Feeling bad or failure about yourself  0 0 - 0 -  Trouble concentrating 0 0 - 0 -  Moving slowly or fidgety/restless 0 0 - 0 -  Suicidal thoughts 0 0 - 0 -  PHQ-9 Score 2 0 - 0 -  Difficult doing work/chores Not difficult at all - - Not difficult at all -   The patient does not have a history of falls. I did not complete a risk assessment for falls. A plan of care for falls was not documented.   Past Medical History:  Past Medical History:  Diagnosis Date  . Allergy   . Depression   . Essential hypertension   . Hyperlipidemia   . Restless legs     Surgical History:  Past Surgical History:  Procedure Laterality Date  . ABDOMINAL HYSTERECTOMY      Medications:  Current Outpatient Medications on File Prior to Visit  Medication Sig  . aspirin EC 81 MG tablet Take 81 mg by mouth daily.  Marland Kitchen ezetimibe (ZETIA) 10 MG tablet Take 1 tablet (10 mg total) by mouth daily.  Marland Kitchen FLUoxetine (PROZAC) 40 MG capsule Take 40 mg by mouth daily.   Marland Kitchen gabapentin (NEURONTIN) 300 MG capsule Take 1 capsule (300 mg total) by mouth daily. Start 1 bedtime for 1 week then 2 bedtime for 1 week and if needed 2 bedtime and 1 in morning  . meloxicam (MOBIC) 15 MG tablet Take 1 tablet (15 mg total) by mouth daily. (Patient not taking: Reported  on 11/07/2019)  . Multiple Vitamin (MULTIVITAMIN) LIQD Take 5 mLs by mouth daily.  . pravastatin (PRAVACHOL) 20 MG tablet Take 1 tablet (20 mg total) by mouth daily.   No current facility-administered medications on file prior to visit.    Allergies:  Allergies  Allergen Reactions  . Lovastatin     Elevated LFT  . Penicillins Rash    Social History:  Social History   Socioeconomic History  . Marital status: Widowed    Spouse name: Not on file  . Number of children: Not on file  . Years of education: 1  . Highest education level: High school graduate  Occupational History  . Occupation:  retired  Tobacco Use  . Smoking status: Current Every Day Smoker    Packs/day: 0.50    Years: 49.00    Pack years: 24.50    Types: Cigarettes  . Smokeless tobacco: Never Used  Substance and Sexual Activity  . Alcohol use: Yes    Comment: occasionally  . Drug use: No  . Sexual activity: Not on file  Other Topics Concern  . Not on file  Social History Narrative  . Not on file   Social Determinants of Health   Financial Resource Strain:   . Difficulty of Paying Living Expenses:   Food Insecurity:   . Worried About Charity fundraiser in the Last Year:   . Arboriculturist in the Last Year:   Transportation Needs:   . Film/video editor (Medical):   Marland Kitchen Lack of Transportation (Non-Medical):   Physical Activity:   . Days of Exercise per Week:   . Minutes of Exercise per Session:   Stress:   . Feeling of Stress :   Social Connections:   . Frequency of Communication with Friends and Family:   . Frequency of Social Gatherings with Friends and Family:   . Attends Religious Services:   . Active Member of Clubs or Organizations:   . Attends Archivist Meetings:   Marland Kitchen Marital Status:   Intimate Partner Violence:   . Fear of Current or Ex-Partner:   . Emotionally Abused:   Marland Kitchen Physically Abused:   . Sexually Abused:    Social History   Tobacco Use  Smoking Status Current Every Day Smoker  . Packs/day: 0.50  . Years: 49.00  . Pack years: 24.50  . Types: Cigarettes  Smokeless Tobacco Never Used   Social History   Substance and Sexual Activity  Alcohol Use Yes   Comment: occasionally    Family History:  Family History  Adopted: Yes  Problem Relation Age of Onset  . Breast cancer Neg Hx     Past medical history, surgical history, medications, allergies, family history and social history reviewed with patient today and changes made to appropriate areas of the chart.   Review of Systems - negative All other ROS negative except what is listed above and in  the HPI.      Objective:    BP 115/69   Pulse 85   Temp 99.4 F (37.4 C) (Oral)   Ht 5\' 3"  (1.6 m)   Wt 171 lb (77.6 kg)   SpO2 97%   BMI 30.29 kg/m   Wt Readings from Last 3 Encounters:  11/07/19 171 lb 3.2 oz (77.7 kg)  06/23/19 171 lb (77.6 kg)  05/09/19 171 lb (77.6 kg)    Physical Exam Constitutional:      General: She is awake. She is not in acute  distress.    Appearance: She is well-developed. She is not ill-appearing.  HENT:     Head: Normocephalic and atraumatic.     Right Ear: Hearing, tympanic membrane, ear canal and external ear normal. No drainage.     Left Ear: Hearing, tympanic membrane, ear canal and external ear normal. No drainage.     Nose: Nose normal.     Right Sinus: No maxillary sinus tenderness or frontal sinus tenderness.     Left Sinus: No maxillary sinus tenderness or frontal sinus tenderness.     Mouth/Throat:     Mouth: Mucous membranes are moist.     Pharynx: Oropharynx is clear. Uvula midline. No pharyngeal swelling, oropharyngeal exudate or posterior oropharyngeal erythema.  Eyes:     General: Lids are normal.        Right eye: No discharge.        Left eye: No discharge.     Extraocular Movements: Extraocular movements intact.     Conjunctiva/sclera: Conjunctivae normal.     Pupils: Pupils are equal, round, and reactive to light.     Visual Fields: Right eye visual fields normal and left eye visual fields normal.  Neck:     Musculoskeletal: Normal range of motion and neck supple.     Thyroid: No thyromegaly.     Vascular: No carotid bruit.     Trachea: Trachea normal.  Cardiovascular:     Rate and Rhythm: Normal rate and regular rhythm.     Heart sounds: Normal heart sounds. No murmur. No gallop.   Pulmonary:     Effort: Pulmonary effort is normal. No accessory muscle usage or respiratory distress.     Breath sounds: Normal breath sounds.  Chest:     Breasts:        Right: Normal.        Left: Normal.  Abdominal:     General:  Bowel sounds are normal.     Palpations: Abdomen is soft. There is no hepatomegaly or splenomegaly.     Tenderness: There is no abdominal tenderness.  Musculoskeletal: Normal range of motion.     Right lower leg: No edema.     Left lower leg: No edema.  Lymphadenopathy:     Head:     Right side of head: No submental, submandibular, tonsillar, preauricular or posterior auricular adenopathy.     Left side of head: No submental, submandibular, tonsillar, preauricular or posterior auricular adenopathy.     Cervical: No cervical adenopathy.     Upper Body:     Right upper body: No supraclavicular, axillary or pectoral adenopathy.     Left upper body: No supraclavicular, axillary or pectoral adenopathy.  Skin:    General: Skin is warm and dry.     Capillary Refill: Capillary refill takes less than 2 seconds.     Findings: No rash.  Neurological:     Mental Status: She is alert and oriented to person, place, and time.     Cranial Nerves: Cranial nerves are intact.     Gait: Gait is intact.     Deep Tendon Reflexes: Reflexes are normal and symmetric.     Reflex Scores:      Brachioradialis reflexes are 2+ on the right side and 2+ on the left side.      Patellar reflexes are 2+ on the right side and 2+ on the left side. Psychiatric:        Attention and Perception: Attention normal.  Mood and Affect: Mood normal.        Speech: Speech normal.        Behavior: Behavior normal. Behavior is cooperative.        Thought Content: Thought content normal.        Judgment: Judgment normal.     Results for orders placed or performed in visit on 05/09/19  Comprehensive metabolic panel  Result Value Ref Range   Glucose 97 65 - 99 mg/dL   BUN 18 8 - 27 mg/dL   Creatinine, Ser 1.00 0.57 - 1.00 mg/dL   GFR calc non Af Amer 58 (L) >59 mL/min/1.73   GFR calc Af Amer 67 >59 mL/min/1.73   BUN/Creatinine Ratio 18 12 - 28   Sodium 143 134 - 144 mmol/L   Potassium 4.9 3.5 - 5.2 mmol/L    Chloride 103 96 - 106 mmol/L   CO2 22 20 - 29 mmol/L   Calcium 9.4 8.7 - 10.3 mg/dL   Total Protein 6.6 6.0 - 8.5 g/dL   Albumin 4.0 3.8 - 4.8 g/dL   Globulin, Total 2.6 1.5 - 4.5 g/dL   Albumin/Globulin Ratio 1.5 1.2 - 2.2   Bilirubin Total 0.3 0.0 - 1.2 mg/dL   Alkaline Phosphatase 76 39 - 117 IU/L   AST 20 0 - 40 IU/L   ALT 12 0 - 32 IU/L  Lipid Panel w/o Chol/HDL Ratio  Result Value Ref Range   Cholesterol, Total 181 100 - 199 mg/dL   Triglycerides 339 (H) 0 - 149 mg/dL   HDL 40 >39 mg/dL   VLDL Cholesterol Cal 56 (H) 5 - 40 mg/dL   LDL Chol Calc (NIH) 85 0 - 99 mg/dL  CBC with Differential/Platelet  Result Value Ref Range   WBC 8.3 3.4 - 10.8 x10E3/uL   RBC 4.33 3.77 - 5.28 x10E6/uL   Hemoglobin 13.8 11.1 - 15.9 g/dL   Hematocrit 40.7 34.0 - 46.6 %   MCV 94 79 - 97 fL   MCH 31.9 26.6 - 33.0 pg   MCHC 33.9 31.5 - 35.7 g/dL   RDW 13.1 11.7 - 15.4 %   Platelets 315 150 - 450 x10E3/uL   Neutrophils 58 Not Estab. %   Lymphs 27 Not Estab. %   Monocytes 9 Not Estab. %   Eos 5 Not Estab. %   Basos 1 Not Estab. %   Neutrophils Absolute 4.8 1.4 - 7.0 x10E3/uL   Lymphocytes Absolute 2.2 0.7 - 3.1 x10E3/uL   Monocytes Absolute 0.8 0.1 - 0.9 x10E3/uL   EOS (ABSOLUTE) 0.5 (H) 0.0 - 0.4 x10E3/uL   Basophils Absolute 0.1 0.0 - 0.2 x10E3/uL   Immature Granulocytes 0 Not Estab. %   Immature Grans (Abs) 0.0 0.0 - 0.1 x10E3/uL  TSH  Result Value Ref Range   TSH 5.980 (H) 0.450 - 4.500 uIU/mL      Assessment & Plan:   Problem List Items Addressed This Visit      Cardiovascular and Mediastinum   Essential hypertension    Chronic, stable with BP below goal.  Continue current medication regimen and adjust as needed.  Follow-up in 6 months.      Relevant Orders   CBC with Differential/Platelet (Completed)     Other   Hyperlipidemia    Chronic, stable.  Continue current medication regimen and adjust as needed.  Labs this visit.      Relevant Orders   Comprehensive metabolic  panel (Completed)   Lipid Panel w/o Chol/HDL Ratio (Completed)  Restless legs    Chronic, stable.  Continue current medication regimen.      Recurrent major depressive disorder, in full remission (HCC)    Chronic, stable.  Denies SI/HI.  Continue current medication regimen and collaboration with psychiatry.       Other Visit Diagnoses    Encounter for physical examination    -  Primary   Relevant Orders   TSH (Completed)   MM 3D SCREEN BREAST BILATERAL (Completed)   Encounter for screening mammogram for malignant neoplasm of breast       Relevant Orders   MM 3D SCREEN BREAST BILATERAL (Completed)   Flu vaccine need       Relevant Orders   Flu Vaccine QUAD High Dose(Fluad) (Completed)       Follow up plan: Return in about 6 months (around 11/07/2019) for HTn/HLD, Mood.   LABORATORY TESTING:  - Pap smear: not applicable  IMMUNIZATIONS:   - Tdap: Tetanus vaccination status reviewed: last tetanus booster within 10 years. - Influenza: Up to date - Pneumovax: Up to date - Prevnar: Up to date - HPV: Not applicable - Zostavax vaccine: Up to date  SCREENING: -Mammogram: Ordered today  - Colonoscopy: Up to date  - Bone Density: Up to date  -Hearing Test: Not applicable  -Spirometry: Not applicable   PATIENT COUNSELING:   Advised to take 1 mg of folate supplement per day if capable of pregnancy.   Sexuality: Discussed sexually transmitted diseases, partner selection, use of condoms, avoidance of unintended pregnancy  and contraceptive alternatives.   Advised to avoid cigarette smoking.  I discussed with the patient that most people either abstain from alcohol or drink within safe limits (<=14/week and <=4 drinks/occasion for males, <=7/weeks and <= 3 drinks/occasion for females) and that the risk for alcohol disorders and other health effects rises proportionally with the number of drinks per week and how often a drinker exceeds daily limits.  Discussed cessation/primary  prevention of drug use and availability of treatment for abuse.   Diet: Encouraged to adjust caloric intake to maintain  or achieve ideal body weight, to reduce intake of dietary saturated fat and total fat, to limit sodium intake by avoiding high sodium foods and not adding table salt, and to maintain adequate dietary potassium and calcium preferably from fresh fruits, vegetables, and low-fat dairy products.    stressed the importance of regular exercise  Injury prevention: Discussed safety belts, safety helmets, smoke detector, smoking near bedding or upholstery.   Dental health: Discussed importance of regular tooth brushing, flossing, and dental visits.    NEXT PREVENTATIVE PHYSICAL DUE IN 1 YEAR. Return in about 6 months (around 11/07/2019) for HTn/HLD, Mood.

## 2019-05-09 NOTE — Assessment & Plan Note (Signed)
Chronic, stable.  Continue current medication regimen and adjust as needed.  Labs this visit.

## 2019-05-09 NOTE — Patient Instructions (Signed)
[facility]  Address: [address] [place], [place], [place] [address]  Phone: [phone]   Living With Depression Everyone experiences occasional disappointment, sadness, and loss in their lives. When you are feeling down, blue, or sad for at least 2 weeks in a row, it may mean that you have depression. Depression can affect your thoughts and feelings, relationships, daily activities, and physical health. It is caused by changes in the way your brain functions. If you receive a diagnosis of depression, your health care provider will tell you which type of depression you have and what treatment options are available to you. If you are living with depression, there are ways to help you recover from it and also ways to prevent it from coming back. How to cope with lifestyle changes Coping with stress     Stress is your body's reaction to life changes and events, both good and bad. Stressful situations may include:  Getting married.  The death of a spouse.  Losing a job.  Retiring.  Having a baby. Stress can last just a few hours or it can be ongoing. Stress can play a major role in depression, so it is important to learn both how to cope with stress and how to think about it differently. Talk with your health care provider or a counselor if you would like to learn more about stress reduction. He or she may suggest some stress reduction techniques, such as:  Music therapy. This can include creating music or listening to music. Choose music that you enjoy and that inspires you.  Mindfulness-based meditation. This kind of meditation can be done while sitting or walking. It involves being aware of your normal breaths, rather than trying to control your breathing.  Centering prayer. This is a kind of meditation that involves focusing on a spiritual word or phrase. Choose a word, phrase, or sacred image that is meaningful to you and that brings you peace.   Deep breathing. To do this, expand your stomach and inhale slowly through your nose. Hold your breath for 3-5 seconds, then exhale slowly, allowing your stomach muscles to relax.  Muscle relaxation. This involves intentionally tensing muscles then relaxing them. Choose a stress reduction technique that fits your lifestyle and personality. Stress reduction techniques take time and practice to develop. Set aside 5-15 minutes a day to do them. Therapists can offer training in these techniques. The training may be covered by some insurance plans. Other things you can do to manage stress include:  Keeping a stress diary. This can help you learn what triggers your stress and ways to control your response.  Understanding what your limits are and saying no to requests or events that lead to a schedule that is too full.  Thinking about how you respond to certain situations. You may not be able to control everything, but you can control how you react.  Adding humor to your life by watching funny films or TV shows.  Making time for activities that help you relax and not feeling guilty about spending your time this way.  Medicines Your health care provider may suggest certain medicines if he or she feels that they will help improve your condition. Avoid using alcohol and other substances that may prevent your medicines from working properly (may interact). It is also important to:  Talk with your pharmacist or health care provider about all the medicines that you take, their possible side effects, and what medicines are safe  to take together.  Make it your goal to take part in all treatment decisions (shared decision-making). This includes giving input on the side effects of medicines. It is best if shared decision-making with your health care provider is part of your total treatment plan. If your health care provider prescribes a medicine, you may not notice the full benefits of it for 4-8 weeks. Most  people who are treated for depression need to be on medicine for at least 6-12 months after they feel better. If you are taking medicines as part of your treatment, do not stop taking medicines without first talking to your health care provider. You may need to have the medicine slowly decreased (tapered) over time to decrease the risk of harmful side effects. Relationships Your health care provider may suggest family therapy along with individual therapy and drug therapy. While there may not be family problems that are causing you to feel depressed, it is still important to make sure your family learns as much as they can about your mental health. Having your family's support can help make your treatment successful. How to recognize changes in your condition Everyone has a different response to treatment for depression. Recovery from major depression happens when you have not had signs of major depression for two months. This may mean that you will start to:  Have more interest in doing activities.  Feel less hopeless than you did 2 months ago.  Have more energy.  Overeat less often, or have better or improving appetite.  Have better concentration. Your health care provider will work with you to decide the next steps in your recovery. It is also important to recognize when your condition is getting worse. Watch for these signs:  Having fatigue or low energy.  Eating too much or too little.  Sleeping too much or too little.  Feeling restless, agitated, or hopeless.  Having trouble concentrating or making decisions.  Having unexplained physical complaints.  Feeling irritable, angry, or aggressive. Get help as soon as you or your family members notice these symptoms coming back. How to get support and help from others How to talk with friends and family members about your condition  Talking to friends and family members about your condition can provide you with one way to get support  and guidance. Reach out to trusted friends or family members, explain your symptoms to them, and let them know that you are working with a health care provider to treat your depression. Financial resources Not all insurance plans cover mental health care, so it is important to check with your insurance carrier. If paying for co-pays or counseling services is a problem, search for a local or county mental health care center. They may be able to offer public mental health care services at low or no cost when you are not able to see a private health care provider. If you are taking medicine for depression, you may be able to get the generic form, which may be less expensive. Some makers of prescription medicines also offer help to patients who cannot afford the medicines they need. Follow these instructions at home:   Get the right amount and quality of sleep.  Cut down on using caffeine, tobacco, alcohol, and other potentially harmful substances.  Try to exercise, such as walking or lifting small weights.  Take over-the-counter and prescription medicines only as told by your health care provider.  Eat a healthy diet that includes plenty of vegetables, fruits, whole grains, low-fat  dairy products, and lean protein. Do not eat a lot of foods that are high in solid fats, added sugars, or salt.  Keep all follow-up visits as told by your health care provider. This is important. Contact a health care provider if:  You stop taking your antidepressant medicines, and you have any of these symptoms: ? Nausea. ? Headache. ? Feeling lightheaded. ? Chills and body aches. ? Not being able to sleep (insomnia).  You or your friends and family think your depression is getting worse. Get help right away if:  You have thoughts of hurting yourself or others. If you ever feel like you may hurt yourself or others, or have thoughts about taking your own life, get help right away. You can go to your nearest  emergency department or call:  Your local emergency services (911 in the [place]  A suicide crisis helpline, such as the [facility] at (878) 752-4693. This is open 24-hours a day. Summary  If you are living with depression, there are ways to help you recover from it and also ways to prevent it from coming back.  Work with your health care team to create a management plan that includes counseling, stress management techniques, and healthy lifestyle habits. This information is not intended to replace advice given to you by your health care provider. Make sure you discuss any questions you have with your health care provider. Document Released: 05/29/2016 Document Revised: 10/18/2018 Document Reviewed: 05/29/2016 Elsevier Patient Education  2020 [employer].

## 2019-05-09 NOTE — Assessment & Plan Note (Signed)
Chronic, stable.  Continue current medication regimen. 

## 2019-05-09 NOTE — Assessment & Plan Note (Signed)
Chronic, stable with BP below goal.  Continue current medication regimen and adjust as needed.  Follow-up in 6 months.

## 2019-05-09 NOTE — Assessment & Plan Note (Signed)
Chronic, stable.  Denies SI/HI.  Continue current medication regimen and collaboration with psychiatry.

## 2019-05-10 LAB — CBC WITH DIFFERENTIAL/PLATELET
Basophils Absolute: 0.1 10*3/uL (ref 0.0–0.2)
Basos: 1 %
EOS (ABSOLUTE): 0.5 10*3/uL — ABNORMAL HIGH (ref 0.0–0.4)
Eos: 5 %
Hematocrit: 40.7 % (ref 34.0–46.6)
Hemoglobin: 13.8 g/dL (ref 11.1–15.9)
Immature Grans (Abs): 0 10*3/uL (ref 0.0–0.1)
Immature Granulocytes: 0 %
Lymphocytes Absolute: 2.2 10*3/uL (ref 0.7–3.1)
Lymphs: 27 %
MCH: 31.9 pg (ref 26.6–33.0)
MCHC: 33.9 g/dL (ref 31.5–35.7)
MCV: 94 fL (ref 79–97)
Monocytes Absolute: 0.8 10*3/uL (ref 0.1–0.9)
Monocytes: 9 %
Neutrophils Absolute: 4.8 10*3/uL (ref 1.4–7.0)
Neutrophils: 58 %
Platelets: 315 10*3/uL (ref 150–450)
RBC: 4.33 x10E6/uL (ref 3.77–5.28)
RDW: 13.1 % (ref 11.7–15.4)
WBC: 8.3 10*3/uL (ref 3.4–10.8)

## 2019-05-10 LAB — COMPREHENSIVE METABOLIC PANEL WITH GFR
ALT: 12 IU/L (ref 0–32)
AST: 20 IU/L (ref 0–40)
Albumin/Globulin Ratio: 1.5 (ref 1.2–2.2)
Albumin: 4 g/dL (ref 3.8–4.8)
Alkaline Phosphatase: 76 IU/L (ref 39–117)
BUN/Creatinine Ratio: 18 (ref 12–28)
BUN: 18 mg/dL (ref 8–27)
Bilirubin Total: 0.3 mg/dL (ref 0.0–1.2)
CO2: 22 mmol/L (ref 20–29)
Calcium: 9.4 mg/dL (ref 8.7–10.3)
Chloride: 103 mmol/L (ref 96–106)
Creatinine, Ser: 1 mg/dL (ref 0.57–1.00)
GFR calc Af Amer: 67 mL/min/1.73 (ref >59)
GFR calc non Af Amer: 58 mL/min/1.73 — ABNORMAL LOW (ref >59)
Globulin, Total: 2.6 g/dL (ref 1.5–4.5)
Glucose: 97 mg/dL (ref 65–99)
Potassium: 4.9 mmol/L (ref 3.5–5.2)
Sodium: 143 mmol/L (ref 134–144)
Total Protein: 6.6 g/dL (ref 6.0–8.5)

## 2019-05-10 LAB — LIPID PANEL W/O CHOL/HDL RATIO
Cholesterol, Total: 181 mg/dL (ref 100–199)
HDL: 40 mg/dL (ref >39)
LDL Chol Calc (NIH): 85 mg/dL (ref 0–99)
Triglycerides: 339 mg/dL — ABNORMAL HIGH (ref 0–149)
VLDL Cholesterol Cal: 56 mg/dL — ABNORMAL HIGH (ref 5–40)

## 2019-05-10 LAB — TSH: TSH: 5.98 u[IU]/mL — ABNORMAL HIGH (ref 0.450–4.500)

## 2019-05-11 ENCOUNTER — Encounter: Payer: Self-pay | Admitting: Nurse Practitioner

## 2019-05-11 DIAGNOSIS — E039 Hypothyroidism, unspecified: Secondary | ICD-10-CM | POA: Insufficient documentation

## 2019-05-11 DIAGNOSIS — E063 Autoimmune thyroiditis: Secondary | ICD-10-CM | POA: Insufficient documentation

## 2019-05-11 DIAGNOSIS — R7989 Other specified abnormal findings of blood chemistry: Secondary | ICD-10-CM | POA: Insufficient documentation

## 2019-05-29 ENCOUNTER — Ambulatory Visit
Admission: RE | Admit: 2019-05-29 | Discharge: 2019-05-29 | Disposition: A | Discharge status: Home / Self Care | Payer: Medicare Other | Source: Ambulatory Visit | Attending: Nurse Practitioner | Admitting: Nurse Practitioner

## 2019-05-29 DIAGNOSIS — Z0000 Encounter for general adult medical examination without abnormal findings: Secondary | ICD-10-CM

## 2019-05-29 DIAGNOSIS — Z1231 Encounter for screening mammogram for malignant neoplasm of breast: Secondary | ICD-10-CM | POA: Diagnosis not present

## 2019-06-10 ENCOUNTER — Telehealth: Payer: Self-pay

## 2019-06-10 DIAGNOSIS — Z87891 Personal history of nicotine dependence: Secondary | ICD-10-CM

## 2019-06-10 DIAGNOSIS — Z122 Encounter for screening for malignant neoplasm of respiratory organs: Secondary | ICD-10-CM

## 2019-06-10 NOTE — Telephone Encounter (Signed)
Patient has been notified that lung cancer screening CT scan is due currently or will be in near future. Confirmed that patient is within the appropriate age range, and asymptomatic, (no signs or symptoms of lung cancer). Patient denies illness that would prevent curative treatment for lung cancer if found. Verified smoking history (current, 1/2 ppd) Patient is agreeable for CT scan being scheduled but does have concerns about the financial aspect of it and was asking if Medicare would cover it. I told her that there is financial assistance and that she could ask Raquel Sarna about that when he calls to schedule. She cannot do Mondays or Wednesdays.

## 2019-06-16 NOTE — Telephone Encounter (Signed)
Smoking history current, 49.5 pack year

## 2019-06-16 NOTE — Addendum Note (Signed)
Addended by: Lieutenant Diego on: 06/16/2019 10:55 AM   Modules accepted: Orders

## 2019-06-23 ENCOUNTER — Other Ambulatory Visit: Payer: Self-pay

## 2019-06-23 ENCOUNTER — Ambulatory Visit
Admission: RE | Admit: 2019-06-23 | Discharge: 2019-06-23 | Disposition: A | Discharge status: Home / Self Care | Payer: Medicare Other | Source: Ambulatory Visit | Attending: Oncology | Admitting: Oncology

## 2019-06-23 DIAGNOSIS — Z122 Encounter for screening for malignant neoplasm of respiratory organs: Secondary | ICD-10-CM | POA: Diagnosis not present

## 2019-06-23 DIAGNOSIS — Z87891 Personal history of nicotine dependence: Secondary | ICD-10-CM | POA: Insufficient documentation

## 2019-06-23 DIAGNOSIS — F17210 Nicotine dependence, cigarettes, uncomplicated: Secondary | ICD-10-CM | POA: Diagnosis not present

## 2019-06-25 ENCOUNTER — Encounter: Payer: Self-pay | Admitting: *Deleted

## 2019-06-30 ENCOUNTER — Other Ambulatory Visit: Payer: Self-pay

## 2019-06-30 MED ORDER — LOSARTAN POTASSIUM-HCTZ 100-12.5 MG PO TABS: 1.0000 | ORAL_TABLET | Freq: Every day | ORAL | 3 refills | Status: DC

## 2019-06-30 NOTE — Telephone Encounter (Signed)
Patient last seen 05/09/19, has appointment 11/07/19

## 2019-07-01 ENCOUNTER — Encounter: Payer: Self-pay | Admitting: Nurse Practitioner

## 2019-07-01 DIAGNOSIS — I2510 Atherosclerotic heart disease of native coronary artery without angina pectoris: Secondary | ICD-10-CM | POA: Insufficient documentation

## 2019-08-05 ENCOUNTER — Ambulatory Visit: Payer: Self-pay | Admitting: Family Medicine

## 2019-09-08 DIAGNOSIS — Z23 Encounter for immunization: Secondary | ICD-10-CM | POA: Diagnosis not present

## 2019-10-07 DIAGNOSIS — Z23 Encounter for immunization: Secondary | ICD-10-CM | POA: Diagnosis not present

## 2019-11-07 ENCOUNTER — Ambulatory Visit (INDEPENDENT_AMBULATORY_CARE_PROVIDER_SITE_OTHER): Payer: Medicare Other | Admitting: Nurse Practitioner

## 2019-11-07 ENCOUNTER — Other Ambulatory Visit: Payer: Self-pay

## 2019-11-07 ENCOUNTER — Encounter: Payer: Self-pay | Admitting: Nurse Practitioner

## 2019-11-07 VITALS — BP 113/72 | HR 88 | Temp 98.4°F | Wt 171.2 lb

## 2019-11-07 DIAGNOSIS — F3342 Major depressive disorder, recurrent, in full remission: Secondary | ICD-10-CM | POA: Diagnosis not present

## 2019-11-07 DIAGNOSIS — I10 Essential (primary) hypertension: Secondary | ICD-10-CM

## 2019-11-07 DIAGNOSIS — R7989 Other specified abnormal findings of blood chemistry: Secondary | ICD-10-CM | POA: Diagnosis not present

## 2019-11-07 DIAGNOSIS — E782 Mixed hyperlipidemia: Secondary | ICD-10-CM

## 2019-11-07 DIAGNOSIS — Z6830 Body mass index (BMI) 30.0-30.9, adult: Secondary | ICD-10-CM | POA: Diagnosis not present

## 2019-11-07 DIAGNOSIS — E669 Obesity, unspecified: Secondary | ICD-10-CM | POA: Insufficient documentation

## 2019-11-07 DIAGNOSIS — F330 Major depressive disorder, recurrent, mild: Secondary | ICD-10-CM | POA: Diagnosis not present

## 2019-11-07 DIAGNOSIS — E6609 Other obesity due to excess calories: Secondary | ICD-10-CM

## 2019-11-07 NOTE — Assessment & Plan Note (Signed)
Chronic, stable with BP below goal.  Continue current medication regimen and adjust as needed.  Recommend monitoring BP at home at least a few times and week and documenting.  BMP today.  Follow-up in 6 months.

## 2019-11-07 NOTE — Assessment & Plan Note (Signed)
Noted on recent labs, will recheck this today and initiate medication as needed.  Currently asymptomatic.

## 2019-11-07 NOTE — Assessment & Plan Note (Signed)
Chronic, stable.  Denies SI/HI.  Continue current medication regimen as prescribed by psychiatry and collaboration with psychiatry. ?

## 2019-11-07 NOTE — Assessment & Plan Note (Signed)
Chronic, stable.  Continue current medication regimen and adjust as needed.  Lipid panel today. 

## 2019-11-07 NOTE — Assessment & Plan Note (Signed)
Recommended eating smaller high protein, low fat meals more frequently and exercising 30 mins a day 5 times a week with a goal of 10-15lb weight loss in the next 3 months. Patient voiced their understanding and motivation to adhere to these recommendations.  

## 2019-11-07 NOTE — Progress Notes (Signed)
BP 113/72   Pulse 88   Temp 98.4 F (36.9 C) (Oral)   Wt 171 lb 3.2 oz (77.7 kg)   SpO2 98%   BMI 30.33 kg/m    Subjective:    Patient ID: Michelle Espinoza, female    DOB: 1950/09/29, 69 y.o.   MRN: [id number]  HPI: Michelle Espinoza is a 69 y.o. female  Chief Complaint  Patient presents with  . Hyperlipidemia  . Hypertension  . Depression    pt states she was prescribed Wellbutrin this morning by psychiatrist, does not know dose   HYPERTENSION / HYPERLIPIDEMIA Continues on Zetia, ASA, Losartan-HCTZ, and Pravastatin. Satisfied with current treatment? yes Duration of hypertension: chronic BP monitoring frequency: rarely BP range: 110/70 range at home BP medication side effects: no Duration of hyperlipidemia: chronic Cholesterol medication side effects: no Cholesterol supplements: none Medication compliance: good compliance Aspirin: yes Recent stressors: no Recurrent headaches: no Visual changes: no Palpitations: no Dyspnea: no Chest pain: no Lower extremity edema: no Dizzy/lightheaded: no   DEPRESSION She is followed by psychiatry (Dr. Nicolasa Ducking) and continues on Prozac.  Saw Dr. Nicolasa Ducking this morning and was started on Wellbutrin, started on this due to patient reporting not having the "oompf" to do things.  Last visit her TSH was slightly elevated at 5.980, discussed with patient.  Denies symptoms, will recheck today. Mood status: stable Satisfied with current treatment?: yes Symptom severity: mild  Duration of current treatment : chronic Side effects: no Medication compliance: good compliance Psychotherapy/counseling: none Depressed mood: no Anxious mood: no Anhedonia: no Significant weight loss or gain: no Insomnia: no  none Fatigue: no Feelings of worthlessness or guilt: no Impaired concentration/indecisiveness: no Suicidal ideations: no Hopelessness: no Crying spells: no Depression screen [facility] 2/9 11/07/2019 05/09/2019 01/23/2019 07/30/2018 01/18/2018    Decreased Interest 0 0 0 0 0  Down, Depressed, Hopeless 0 0 0 0 0  PHQ - 2 Score 0 0 0 0 0  Altered sleeping 1 0 - 0 -  Tired, decreased energy 1 0 - 0 -  Change in appetite 0 0 - 0 -  Feeling bad or failure about yourself  0 0 - 0 -  Trouble concentrating 0 0 - 0 -  Moving slowly or fidgety/restless 0 0 - 0 -  Suicidal thoughts 0 0 - 0 -  PHQ-9 Score 2 0 - 0 -  Difficult doing work/chores Not difficult at all - - Not difficult at all -    Relevant past medical, surgical, family and social history reviewed and updated as indicated. Interim medical history since our last visit reviewed. Allergies and medications reviewed and updated.  Review of Systems  Constitutional: Negative for activity change, appetite change, diaphoresis, fatigue and fever.  Respiratory: Negative for cough, chest tightness and shortness of breath.   Cardiovascular: Negative for chest pain, palpitations and leg swelling.  Gastrointestinal: Negative.   Endocrine: Negative for cold intolerance and heat intolerance.  Neurological: Negative.   Psychiatric/Behavioral: Negative.     Per HPI unless specifically indicated above     Objective:    BP 113/72   Pulse 88   Temp 98.4 F (36.9 C) (Oral)   Wt 171 lb 3.2 oz (77.7 kg)   SpO2 98%   BMI 30.33 kg/m   Wt Readings from Last 3 Encounters:  11/07/19 171 lb 3.2 oz (77.7 kg)  06/23/19 171 lb (77.6 kg)  05/09/19 171 lb (77.6 kg)    Physical Exam Vitals and nursing note reviewed.  Constitutional:      General: She is awake. She is not in acute distress.    Appearance: She is well-developed. She is obese. She is not ill-appearing.  HENT:     Head: Normocephalic.     Right Ear: Hearing normal.     Left Ear: Hearing normal.  Eyes:     General: Lids are normal.        Right eye: No discharge.        Left eye: No discharge.     Conjunctiva/sclera: Conjunctivae normal.     Pupils: Pupils are equal, round, and reactive to light.  Neck:     Thyroid: No  thyromegaly.     Vascular: No carotid bruit.  Cardiovascular:     Rate and Rhythm: Normal rate and regular rhythm.     Heart sounds: Normal heart sounds. No murmur. No gallop.   Pulmonary:     Effort: Pulmonary effort is normal. No accessory muscle usage or respiratory distress.     Breath sounds: Normal breath sounds.  Abdominal:     General: Bowel sounds are normal.     Palpations: Abdomen is soft.  Musculoskeletal:     Cervical back: Normal range of motion and neck supple.     Right lower leg: No edema.     Left lower leg: No edema.  Skin:    General: Skin is warm and dry.  Neurological:     Mental Status: She is alert and oriented to person, place, and time.  Psychiatric:        Attention and Perception: Attention normal.        Mood and Affect: Mood normal.        Speech: Speech normal.        Behavior: Behavior normal. Behavior is cooperative.        Thought Content: Thought content normal.     Results for orders placed or performed in visit on 05/09/19  Comprehensive metabolic panel  Result Value Ref Range   Glucose 97 65 - 99 mg/dL   BUN 18 8 - 27 mg/dL   Creatinine, Ser 1.00 0.57 - 1.00 mg/dL   GFR calc non Af Amer 58 (L) >59 mL/min/1.73   GFR calc Af Amer 67 >59 mL/min/1.73   BUN/Creatinine Ratio 18 12 - 28   Sodium 143 134 - 144 mmol/L   Potassium 4.9 3.5 - 5.2 mmol/L   Chloride 103 96 - 106 mmol/L   CO2 22 20 - 29 mmol/L   Calcium 9.4 8.7 - 10.3 mg/dL   Total Protein 6.6 6.0 - 8.5 g/dL   Albumin 4.0 3.8 - 4.8 g/dL   Globulin, Total 2.6 1.5 - 4.5 g/dL   Albumin/Globulin Ratio 1.5 1.2 - 2.2   Bilirubin Total 0.3 0.0 - 1.2 mg/dL   Alkaline Phosphatase 76 39 - 117 IU/L   AST 20 0 - 40 IU/L   ALT 12 0 - 32 IU/L  Lipid Panel w/o Chol/HDL Ratio  Result Value Ref Range   Cholesterol, Total 181 100 - 199 mg/dL   Triglycerides 339 (H) 0 - 149 mg/dL   HDL 40 >39 mg/dL   VLDL Cholesterol Cal 56 (H) 5 - 40 mg/dL   LDL Chol Calc (NIH) 85 0 - 99 mg/dL  CBC with  Differential/Platelet  Result Value Ref Range   WBC 8.3 3.4 - 10.8 x10E3/uL   RBC 4.33 3.77 - 5.28 x10E6/uL   Hemoglobin 13.8 11.1 - 15.9 g/dL   Hematocrit 40.7  34.0 - 46.6 %   MCV 94 79 - 97 fL   MCH 31.9 26.6 - 33.0 pg   MCHC 33.9 31.5 - 35.7 g/dL   RDW 13.1 11.7 - 15.4 %   Platelets 315 150 - 450 x10E3/uL   Neutrophils 58 Not Estab. %   Lymphs 27 Not Estab. %   Monocytes 9 Not Estab. %   Eos 5 Not Estab. %   Basos 1 Not Estab. %   Neutrophils Absolute 4.8 1.4 - 7.0 x10E3/uL   Lymphocytes Absolute 2.2 0.7 - 3.1 x10E3/uL   Monocytes Absolute 0.8 0.1 - 0.9 x10E3/uL   EOS (ABSOLUTE) 0.5 (H) 0.0 - 0.4 x10E3/uL   Basophils Absolute 0.1 0.0 - 0.2 x10E3/uL   Immature Granulocytes 0 Not Estab. %   Immature Grans (Abs) 0.0 0.0 - 0.1 x10E3/uL  TSH  Result Value Ref Range   TSH 5.980 (H) 0.450 - 4.500 uIU/mL      Assessment & Plan:   Problem List Items Addressed This Visit      Cardiovascular and Mediastinum   Essential hypertension    Chronic, stable with BP below goal.  Continue current medication regimen and adjust as needed.  Recommend monitoring BP at home at least a few times and week and documenting.  BMP today.  Follow-up in 6 months.      Relevant Orders   Basic metabolic panel     Other   Hyperlipidemia    Chronic, stable.  Continue current medication regimen and adjust as needed.  Lipid panel today.      Relevant Orders   Lipid Panel w/o Chol/HDL Ratio   Recurrent major depressive disorder, in full remission ([place]) - Primary    Chronic, stable.  Denies SI/HI.  Continue current medication regimen as prescribed by psychiatry and collaboration with psychiatry.      Elevated TSH    Noted on recent labs, will recheck this today and initiate medication as needed.  Currently asymptomatic.      Relevant Orders   Thyroid Panel With TSH   Obesity    Recommended eating smaller high protein, low fat meals more frequently and exercising 30 mins a day 5 times a week with  a goal of 10-15lb weight loss in the next 3 months. Patient voiced their understanding and motivation to adhere to these recommendations.           Follow up plan: Return in about 6 months (around 05/08/2020) for Annual physical -- Medicare.

## 2019-11-07 NOTE — Patient Instructions (Signed)
Thyroid-Stimulating Hormone Test Why am I having this test? You may have a thyroid-stimulating hormone (TSH) test if you have possible symptoms of abnormal thyroid hormone levels. This test can help your health care provider:  Diagnose a disorder of the thyroid gland or pituitary gland.  Manage your condition and treatment if you have an underactive thyroid (hypothyroidism) or an overactive thyroid (hyperthyroidism). Newborn babies may have this test done to screen for hypothyroidism that is present at birth (congenital). The thyroid is a gland in the lower front of the neck. It makes hormones that affect many body parts and systems, including the system that affects how quickly the body burns fuel for energy (metabolism). The pituitary gland is located just below the brain, behind the eyes and nasal passages. It helps maintain thyroid hormone levels and thyroid gland function. What is being tested? This test measures the amount of TSH in your blood. TSH may also be called thyrotropin. When the thyroid does not make enough hormones, the pituitary gland releases TSH into the bloodstream to stimulate the thyroid gland to make more hormones. What kind of sample is taken?     A blood sample is required for this test. It is usually collected by inserting a needle into a blood vessel. For newborns, a small amount of blood may be collected from the umbilical cord, or by using a small needle to prick the baby's heel (heel stick). Tell a health care provider about:  All medicines you are taking, including vitamins, herbs, eye drops, creams, and over-the-counter medicines.  Any blood disorders you have.  Any surgeries you have had.  Any medical conditions you have.  Whether you are pregnant or may be pregnant. How are the results reported? Your test results will be reported as a value that indicates how much TSH is in your blood. Your health care provider will compare your results to normal ranges  that were established after testing a large group of people (reference ranges). Reference ranges may vary among labs and hospitals. For this test, common reference ranges are:  Adult: 2-10 microunits/mL or 2-10 milliunits/L.  Newborn: ? Heel stick: 3-18 microunits/mL or 3-18 milliunits/L. ? Umbilical cord: 1-94 microunits/mL or 3-12 milliunits/L. What do the results mean? Results that are within the reference range are considered normal. This means that you have a normal amount of TSH in your blood. Results that are higher than the reference range mean that your TSH levels are too high. This may mean:  Your thyroid gland is not making enough thyroid hormones.  Your thyroid medicine dosage is too low.  You have a tumor on your pituitary gland. This is rare. Results that are lower than the reference range mean that your TSH levels are too low. This may be caused by hyperthyroidism or by a problem with the pituitary gland function. Talk with your health care provider about what your results mean. Questions to ask your health care provider Ask your health care provider, or the department that is doing the test:  When will my results be ready?  How will I get my results?  What are my treatment options?  What other tests do I need?  What are my next steps? Summary  You may have a thyroid-stimulating hormone (TSH) test if you have possible symptoms of abnormal thyroid hormone levels.  The thyroid is a gland in the lower front of the neck. It makes hormones that affect many body parts and systems.  The pituitary gland is located  just below the brain, behind the eyes and nasal passages. It helps maintain thyroid hormone levels and thyroid gland function.  This test measures the amount of TSH in your blood. TSH is made by the pituitary gland. It may also be called thyrotropin. This information is not intended to replace advice given to you by your health care provider. Make sure you  discuss any questions you have with your health care provider. Document Revised: 09/24/2017 Document Reviewed: 02/27/2017 Elsevier Patient Education  2020 [employer]vier Inc.  

## 2019-11-08 ENCOUNTER — Other Ambulatory Visit: Payer: Self-pay | Admitting: Nurse Practitioner

## 2019-11-08 DIAGNOSIS — E782 Mixed hyperlipidemia: Secondary | ICD-10-CM

## 2019-11-08 DIAGNOSIS — R7989 Other specified abnormal findings of blood chemistry: Secondary | ICD-10-CM

## 2019-11-08 LAB — BASIC METABOLIC PANEL
BUN/Creatinine Ratio: 19 (ref 12–28)
BUN: 16 mg/dL (ref 8–27)
CO2: 22 mmol/L (ref 20–29)
Calcium: 9.5 mg/dL (ref 8.7–10.3)
Chloride: 105 mmol/L (ref 96–106)
Creatinine, Ser: 0.85 mg/dL (ref 0.57–1.00)
GFR calc Af Amer: 82 mL/min/{1.73_m2} (ref >59)
GFR calc non Af Amer: 71 mL/min/{1.73_m2} (ref >59)
Glucose: 127 mg/dL — ABNORMAL HIGH (ref 65–99)
Potassium: 4.4 mmol/L (ref 3.5–5.2)
Sodium: 144 mmol/L (ref 134–144)

## 2019-11-08 LAB — LIPID PANEL W/O CHOL/HDL RATIO
Cholesterol, Total: 208 mg/dL — ABNORMAL HIGH (ref 100–199)
HDL: 42 mg/dL (ref >39)
LDL Chol Calc (NIH): 104 mg/dL — ABNORMAL HIGH (ref 0–99)
Triglycerides: 365 mg/dL — ABNORMAL HIGH (ref 0–149)
VLDL Cholesterol Cal: 62 mg/dL — ABNORMAL HIGH (ref 5–40)

## 2019-11-08 LAB — THYROID PANEL WITH TSH
Free Thyroxine Index: 2 (ref 1.2–4.9)
T3 Uptake Ratio: 28 % (ref 24–39)
T4, Total: 7.3 ug/dL (ref 4.5–12.0)
TSH: 5.29 u[IU]/mL — ABNORMAL HIGH (ref 0.450–4.500)

## 2019-11-08 NOTE — Progress Notes (Signed)
Contacted via [place] evening [facility].  Your labs have returned: - Kidney function is normal on this check.  Glucose (sugar) level was a little elevated.  If you had ate or drank anything sweet before visit it would explain that. - Thyroid level -- TSH continues to be slightly elevated although on downward trend and T4 is normal.  I would like to check this in 6 weeks via an outpatient lab visit.  I will order labs, but could you please schedule appointment for this.  Thank you. - Cholesterol levels were elevated this check.  Did you eat before visit?  If so this would explain it.  Are you taking Pravastatin daily? To help prevent stroke and gain better control it may be beneficial to increase your Pravastatin to 40 MG.  Would be good to get LDL <70, which is stroke preventative.  Would you be okay with increasing dose? Let me know your thoughts. Keep being awesome!! Kindest regards, Arelis Neumeier  The 10-year ASCVD risk score Mikey Bussing DC Jr., et al., 2013) is: 13.8%   Values used to calculate the score:     Age: 69 years     Sex: Female     Is Non-Hispanic African American: No     Diabetic: No     Tobacco smoker: Yes     Systolic Blood Pressure: 123456 mmHg     Is BP treated: Yes     HDL Cholesterol: 42 mg/dL     Total Cholesterol: 208 mg/dL

## 2019-11-08 NOTE — Progress Notes (Signed)
Thyroid and Cholesterol recheck labs.

## 2019-11-28 ENCOUNTER — Telehealth: Payer: Self-pay | Admitting: Nurse Practitioner

## 2019-11-28 DIAGNOSIS — F330 Major depressive disorder, recurrent, mild: Secondary | ICD-10-CM | POA: Diagnosis not present

## 2019-11-28 NOTE — Telephone Encounter (Signed)
Yes, she was to have repeat labs but has not had these.  They are ordered.  Please schedule office visit and alert her I did chat with her psychiatrist today about plan of care for rechecking labs first.  Thanks

## 2019-11-28 NOTE — Telephone Encounter (Signed)
Pt was advised she would start on thyroid medication if she was still having a thyroid issue/ Pt called to ask Jolene if she will be prescribing a med for her of if she needs to come in/ Please advise and call Pt

## 2019-11-28 NOTE — Telephone Encounter (Signed)
Does patient need an OV ? 

## 2019-11-28 NOTE — Telephone Encounter (Signed)
Patient notified of Jolene's message. Scheduled for an OV for 12/01/19 at 1:30 pm

## 2019-12-01 ENCOUNTER — Encounter: Payer: Self-pay | Admitting: Nurse Practitioner

## 2019-12-01 ENCOUNTER — Ambulatory Visit (INDEPENDENT_AMBULATORY_CARE_PROVIDER_SITE_OTHER): Payer: Medicare Other | Admitting: Nurse Practitioner

## 2019-12-01 ENCOUNTER — Other Ambulatory Visit: Payer: Self-pay

## 2019-12-01 VITALS — BP 98/66 | HR 85 | Temp 98.6°F | Wt 172.0 lb

## 2019-12-01 DIAGNOSIS — E039 Hypothyroidism, unspecified: Secondary | ICD-10-CM | POA: Diagnosis not present

## 2019-12-01 DIAGNOSIS — Z6830 Body mass index (BMI) 30.0-30.9, adult: Secondary | ICD-10-CM

## 2019-12-01 DIAGNOSIS — R7989 Other specified abnormal findings of blood chemistry: Secondary | ICD-10-CM

## 2019-12-01 DIAGNOSIS — E6609 Other obesity due to excess calories: Secondary | ICD-10-CM

## 2019-12-01 NOTE — Patient Instructions (Signed)
Hypothyroidism  Hypothyroidism is when the thyroid gland does not make enough of certain hormones (it is underactive). The thyroid gland is a small gland located in the lower front part of the neck, just in front of the windpipe (trachea). This gland makes hormones that help control how the body uses food for energy (metabolism) as well as how the heart and brain function. These hormones also play a role in keeping your bones strong. When the thyroid is underactive, it produces too little of the hormones thyroxine (T4) and triiodothyronine (T3). What are the causes? This condition may be caused by:  Hashimoto's disease. This is a disease in which the body's disease-fighting system (immune system) attacks the thyroid gland. This is the most common cause.  Viral infections.  Pregnancy.  Certain medicines.  Birth defects.  Past radiation treatments to the head or neck for cancer.  Past treatment with radioactive iodine.  Past exposure to radiation in the environment.  Past surgical removal of part or all of the thyroid.  Problems with a gland in the center of the brain (pituitary gland).  Lack of enough iodine in the diet. What increases the risk? You are more likely to develop this condition if:  You are female.  You have a family history of thyroid conditions.  You use a medicine called lithium.  You take medicines that affect the immune system (immunosuppressants). What are the signs or symptoms? Symptoms of this condition include:  Feeling as though you have no energy (lethargy).  Not being able to tolerate cold.  Weight gain that is not explained by a change in diet or exercise habits.  Lack of appetite.  Dry skin.  Coarse hair.  Menstrual irregularity.  Slowing of thought processes.  Constipation.  Sadness or depression. How is this diagnosed? This condition may be diagnosed based on:  Your symptoms, your medical history, and a physical exam.  Blood  tests. You may also have imaging tests, such as an ultrasound or MRI. How is this treated? This condition is treated with medicine that replaces the thyroid hormones that your body does not make. After you begin treatment, it may take several weeks for symptoms to go away. Follow these instructions at home:  Take over-the-counter and prescription medicines only as told by your health care provider.  If you start taking any new medicines, tell your health care provider.  Keep all follow-up visits as told by your health care provider. This is important. ? As your condition improves, your dosage of thyroid hormone medicine may change. ? You will need to have blood tests regularly so that your health care provider can monitor your condition. Contact a health care provider if:  Your symptoms do not get better with treatment.  You are taking thyroid replacement medicine and you: ? Sweat a lot. ? Have tremors. ? Feel anxious. ? Lose weight rapidly. ? Cannot tolerate heat. ? Have emotional swings. ? Have diarrhea. ? Feel weak. Get help right away if you have:  Chest pain.  An irregular heartbeat.  A rapid heartbeat.  Difficulty breathing. Summary  Hypothyroidism is when the thyroid gland does not make enough of certain hormones (it is underactive).  When the thyroid is underactive, it produces too little of the hormones thyroxine (T4) and triiodothyronine (T3).  The most common cause is Hashimoto's disease, a disease in which the body's disease-fighting system (immune system) attacks the thyroid gland. The condition can also be caused by viral infections, medicine, pregnancy, or past   radiation treatment to the head or neck.  Symptoms may include weight gain, dry skin, constipation, feeling as though you do not have energy, and not being able to tolerate cold.  This condition is treated with medicine to replace the thyroid hormones that your body does not make. This information  is not intended to replace advice given to you by your health care provider. Make sure you discuss any questions you have with your health care provider. Document Revised: 06/08/2017 Document Reviewed: 06/06/2017 Elsevier Patient Education  2020 [employer] Inc.  

## 2019-12-01 NOTE — Assessment & Plan Note (Addendum)
Trending down on recent labs with no medications, will recheck this today along with free T4 and initiate medication as needed.  Currently asymptomatic, with exception of fatigue which she feels is more mood related -- increase in Wellbutrin at recent psychiatry visit.

## 2019-12-01 NOTE — Progress Notes (Signed)
BP 98/66   Pulse 85   Temp 98.6 F (37 C) (Oral)   Wt 172 lb (78 kg)   SpO2 97%   BMI 30.47 kg/m    Subjective:    Patient ID: Michelle Espinoza, female    DOB: March 08, 1951, 69 y.o.   MRN: [id number]  HPI: Michelle Espinoza is a 69 y.o. female  Chief Complaint  Patient presents with  . Hypothyroidism   ELEVATED TSH Noted on recent labs in October was 5.980 and then in April downward trend 5.290 TSH and T4 7.4.  No current medications. Fatigue: yes Cold intolerance: no Heat intolerance: no Weight gain: no Weight loss: no Constipation: no Diarrhea/loose stools: no Palpitations: no Lower extremity edema: no Anxiety/depressed mood: yes  Relevant past medical, surgical, family and social history reviewed and updated as indicated. Interim medical history since our last visit reviewed. Allergies and medications reviewed and updated.  Review of Systems  Constitutional: Positive for fatigue. Negative for activity change, appetite change, diaphoresis and fever.  Respiratory: Negative for cough, chest tightness and shortness of breath.   Cardiovascular: Negative for chest pain, palpitations and leg swelling.  Gastrointestinal: Negative.   Endocrine: Negative for cold intolerance and heat intolerance.  Neurological: Negative.   Psychiatric/Behavioral: Negative.     Per HPI unless specifically indicated above     Objective:    BP 98/66   Pulse 85   Temp 98.6 F (37 C) (Oral)   Wt 172 lb (78 kg)   SpO2 97%   BMI 30.47 kg/m   Wt Readings from Last 3 Encounters:  12/01/19 172 lb (78 kg)  11/07/19 171 lb 3.2 oz (77.7 kg)  06/23/19 171 lb (77.6 kg)    Physical Exam Vitals and nursing note reviewed.  Constitutional:      General: She is awake. She is not in acute distress.    Appearance: She is well-developed. She is obese. She is not ill-appearing.  HENT:     Head: Normocephalic.     Right Ear: Hearing normal.     Left Ear: Hearing normal.  Eyes:     General: Lids  are normal.        Right eye: No discharge.        Left eye: No discharge.     Conjunctiva/sclera: Conjunctivae normal.     Pupils: Pupils are equal, round, and reactive to light.  Neck:     Thyroid: No thyromegaly.     Vascular: No carotid bruit.  Cardiovascular:     Rate and Rhythm: Normal rate and regular rhythm.     Heart sounds: Normal heart sounds. No murmur. No gallop.   Pulmonary:     Effort: Pulmonary effort is normal. No accessory muscle usage or respiratory distress.     Breath sounds: Normal breath sounds.  Abdominal:     General: Bowel sounds are normal.     Palpations: Abdomen is soft.  Musculoskeletal:     Cervical back: Normal range of motion and neck supple.     Right lower leg: No edema.     Left lower leg: No edema.  Skin:    General: Skin is warm and dry.  Neurological:     Mental Status: She is alert and oriented to person, place, and time.  Psychiatric:        Attention and Perception: Attention normal.        Mood and Affect: Mood normal.        Speech: Speech  normal.        Behavior: Behavior normal. Behavior is cooperative.        Thought Content: Thought content normal.     Results for orders placed or performed in visit on [id number]  Basic metabolic panel  Result Value Ref Range   Glucose 127 (H) 65 - 99 mg/dL   BUN 16 8 - 27 mg/dL   Creatinine, Ser 0.85 0.57 - 1.00 mg/dL   GFR calc non Af Amer 71 >59 mL/min/1.73   GFR calc Af Amer 82 >59 mL/min/1.73   BUN/Creatinine Ratio 19 12 - 28   Sodium 144 134 - 144 mmol/L   Potassium 4.4 3.5 - 5.2 mmol/L   Chloride 105 96 - 106 mmol/L   CO2 22 20 - 29 mmol/L   Calcium 9.5 8.7 - 10.3 mg/dL  Lipid Panel w/o Chol/HDL Ratio  Result Value Ref Range   Cholesterol, Total 208 (H) 100 - 199 mg/dL   Triglycerides 365 (H) 0 - 149 mg/dL   HDL 42 >39 mg/dL   VLDL Cholesterol Cal 62 (H) 5 - 40 mg/dL   LDL Chol Calc (NIH) 104 (H) 0 - 99 mg/dL  Thyroid Panel With TSH  Result Value Ref Range   TSH 5.290 (H)  0.450 - 4.500 uIU/mL   T4, Total 7.3 4.5 - 12.0 ug/dL   T3 Uptake Ratio 28 24 - 39 %   Free Thyroxine Index 2.0 1.2 - 4.9      Assessment & Plan:   Problem List Items Addressed This Visit      Other   Elevated TSH - Primary    Trending down on recent labs with no medications, will recheck this today along with free T4 and initiate medication as needed.  Currently asymptomatic, with exception of fatigue which she feels is more mood related -- increase in Wellbutrin at recent psychiatry visit.      Relevant Orders   TSH   T4, free   Obesity    Recommended eating smaller high protein, low fat meals more frequently and exercising 30 mins a day 5 times a week with a goal of 10-15lb weight loss in the next 3 months. Patient voiced their understanding and motivation to adhere to these recommendations.        Other Visit Diagnoses    Hypothyroidism, unspecified type       No current medications, recheck TSH today.   Relevant Orders   TSH   T4, free       Follow up plan: Return in about 6 weeks (around 01/12/2020) for Thyroid, HTN/HLD, MOOD.

## 2019-12-01 NOTE — Assessment & Plan Note (Signed)
Recommended eating smaller high protein, low fat meals more frequently and exercising 30 mins a day 5 times a week with a goal of 10-15lb weight loss in the next 3 months. Patient voiced their understanding and motivation to adhere to these recommendations.  

## 2019-12-02 LAB — TSH: TSH: 5.9 u[IU]/mL — ABNORMAL HIGH (ref 0.450–4.500)

## 2019-12-02 LAB — T4, FREE: Free T4: 1.11 ng/dL (ref 0.82–1.77)

## 2019-12-02 NOTE — Progress Notes (Signed)
Contacted via [place] afternoon [employer], your thyroid testing has returned.  TSH remains elevated, but T4 normal.  TSH has increased from previous.  We could try you on a low dose of Levothyroxine and see if this offers benefit to fatigue.  Would you like to try this?  Let me know and I will send in + then would want to see you in office in 6 weeks for visit to follow-up.  Thank you. Keep being awesome!! Kindest regards, Atif Chapple

## 2019-12-03 ENCOUNTER — Other Ambulatory Visit: Payer: Self-pay | Admitting: Nurse Practitioner

## 2019-12-03 MED ORDER — LEVOTHYROXINE SODIUM 25 MCG PO TABS: 25.0000 ug | ORAL_TABLET | Freq: Every day | ORAL | 3 refills | Status: DC

## 2019-12-03 NOTE — Progress Notes (Signed)
Levothyroxine start

## 2020-01-20 ENCOUNTER — Other Ambulatory Visit: Payer: Self-pay

## 2020-01-20 ENCOUNTER — Encounter: Payer: Self-pay | Admitting: Nurse Practitioner

## 2020-01-20 ENCOUNTER — Ambulatory Visit (INDEPENDENT_AMBULATORY_CARE_PROVIDER_SITE_OTHER): Payer: Medicare Other | Admitting: Nurse Practitioner

## 2020-01-20 VITALS — BP 119/77 | HR 78 | Temp 98.2°F | Wt 171.8 lb

## 2020-01-20 DIAGNOSIS — E039 Hypothyroidism, unspecified: Secondary | ICD-10-CM | POA: Diagnosis not present

## 2020-01-20 DIAGNOSIS — E6609 Other obesity due to excess calories: Secondary | ICD-10-CM

## 2020-01-20 DIAGNOSIS — Z6830 Body mass index (BMI) 30.0-30.9, adult: Secondary | ICD-10-CM | POA: Diagnosis not present

## 2020-01-20 NOTE — Assessment & Plan Note (Signed)
Ongoing with recent start of Levothyroxine due to ongoing TSH elevation, symptomatic.  Tolerating medication well, Levothyroxine 25 MCG daily.  Continue current medication regimen and adjust as needed.  TSH and Free T4 today.

## 2020-01-20 NOTE — Assessment & Plan Note (Signed)
BMI 30.43.  Recommended eating smaller high protein, low fat meals more frequently and exercising 30 mins a day 5 times a week with a goal of 10-15lb weight loss in the next 3 months. Patient voiced their understanding and motivation to adhere to these recommendations.

## 2020-01-20 NOTE — Progress Notes (Signed)
BP 119/77   Pulse 78   Temp 98.2 F (36.8 C) (Oral)   Wt 171 lb 12.8 oz (77.9 kg)   SpO2 97%   BMI 30.43 kg/m    Subjective:    Patient ID: Michelle Espinoza, female    DOB: 02-17-1951, 69 y.o.   MRN: [id number]  HPI: Michelle Espinoza is a 69 y.o. female  Chief Complaint  Patient presents with  . Hypothyroidism    6 week f/up   HYPOTHYROIDISM Started Levothyroxine 25 MCG at recent visit due to symptoms and TSH ongoing elevation -- 5.900 and free T4 1.11.  She reports fatigue is improving and tolerating medication well. Thyroid control status:stable Satisfied with current treatment? yes Medication side effects: no Medication compliance: good compliance Etiology of hypothyroidism: unknown Recent dose adjustment:no Fatigue: improving Cold intolerance: no Heat intolerance: no Weight gain: no Weight loss: no Constipation: no Diarrhea/loose stools: no Palpitations: no Lower extremity edema: no Anxiety/depressed mood: no   Relevant past medical, surgical, family and social history reviewed and updated as indicated. Interim medical history since our last visit reviewed. Allergies and medications reviewed and updated.  Review of Systems  Constitutional: Negative for activity change, appetite change, diaphoresis, fatigue and fever.  Respiratory: Negative for cough, chest tightness and shortness of breath.   Cardiovascular: Negative for chest pain, palpitations and leg swelling.  Gastrointestinal: Negative.   Endocrine: Negative for cold intolerance and heat intolerance.  Neurological: Negative.   Psychiatric/Behavioral: Negative.     Per HPI unless specifically indicated above     Objective:    BP 119/77   Pulse 78   Temp 98.2 F (36.8 C) (Oral)   Wt 171 lb 12.8 oz (77.9 kg)   SpO2 97%   BMI 30.43 kg/m   Wt Readings from Last 3 Encounters:  01/20/20 171 lb 12.8 oz (77.9 kg)  12/01/19 172 lb (78 kg)  11/07/19 171 lb 3.2 oz (77.7 kg)    Physical Exam Vitals  and nursing note reviewed.  Constitutional:      General: She is awake. She is not in acute distress.    Appearance: She is well-developed. She is obese. She is not ill-appearing.  HENT:     Head: Normocephalic.     Right Ear: Hearing normal.     Left Ear: Hearing normal.  Eyes:     General: Lids are normal.        Right eye: No discharge.        Left eye: No discharge.     Conjunctiva/sclera: Conjunctivae normal.     Pupils: Pupils are equal, round, and reactive to light.  Neck:     Thyroid: No thyromegaly.     Vascular: No carotid bruit.  Cardiovascular:     Rate and Rhythm: Normal rate and regular rhythm.     Heart sounds: Normal heart sounds. No murmur heard.  No gallop.   Pulmonary:     Effort: Pulmonary effort is normal. No accessory muscle usage or respiratory distress.     Breath sounds: Normal breath sounds.  Abdominal:     General: Bowel sounds are normal.     Palpations: Abdomen is soft.  Musculoskeletal:     Cervical back: Normal range of motion and neck supple.     Right lower leg: No edema.     Left lower leg: No edema.  Skin:    General: Skin is warm and dry.  Neurological:     Mental Status: She is alert  and oriented to person, place, and time.  Psychiatric:        Attention and Perception: Attention normal.        Mood and Affect: Mood normal.        Speech: Speech normal.        Behavior: Behavior normal. Behavior is cooperative.        Thought Content: Thought content normal.     Results for orders placed or performed in visit on 12/01/19  TSH  Result Value Ref Range   TSH 5.900 (H) 0.450 - 4.500 uIU/mL  T4, free  Result Value Ref Range   Free T4 1.11 0.82 - 1.77 ng/dL      Assessment & Plan:   Problem List Items Addressed This Visit      Endocrine   Hypothyroid - Primary    Ongoing with recent start of Levothyroxine due to ongoing TSH elevation, symptomatic.  Tolerating medication well, Levothyroxine 25 MCG daily.  Continue current  medication regimen and adjust as needed.  TSH and Free T4 today.      Relevant Orders   TSH   T4, free     Other   Obesity    BMI 30.43.  Recommended eating smaller high protein, low fat meals more frequently and exercising 30 mins a day 5 times a week with a goal of 10-15lb weight loss in the next 3 months. Patient voiced their understanding and motivation to adhere to these recommendations.           Follow up plan: Return in about 4 months (around 05/22/2020) for Medicare physical.

## 2020-01-20 NOTE — Patient Instructions (Signed)
Hypothyroidism  Hypothyroidism is when the thyroid gland does not make enough of certain hormones (it is underactive). The thyroid gland is a small gland located in the lower front part of the neck, just in front of the windpipe (trachea). This gland makes hormones that help control how the body uses food for energy (metabolism) as well as how the heart and brain function. These hormones also play a role in keeping your bones strong. When the thyroid is underactive, it produces too little of the hormones thyroxine (T4) and triiodothyronine (T3). What are the causes? This condition may be caused by:  Hashimoto's disease. This is a disease in which the body's disease-fighting system (immune system) attacks the thyroid gland. This is the most common cause.  Viral infections.  Pregnancy.  Certain medicines.  Birth defects.  Past radiation treatments to the head or neck for cancer.  Past treatment with radioactive iodine.  Past exposure to radiation in the environment.  Past surgical removal of part or all of the thyroid.  Problems with a gland in the center of the brain (pituitary gland).  Lack of enough iodine in the diet. What increases the risk? You are more likely to develop this condition if:  You are female.  You have a family history of thyroid conditions.  You use a medicine called lithium.  You take medicines that affect the immune system (immunosuppressants). What are the signs or symptoms? Symptoms of this condition include:  Feeling as though you have no energy (lethargy).  Not being able to tolerate cold.  Weight gain that is not explained by a change in diet or exercise habits.  Lack of appetite.  Dry skin.  Coarse hair.  Menstrual irregularity.  Slowing of thought processes.  Constipation.  Sadness or depression. How is this diagnosed? This condition may be diagnosed based on:  Your symptoms, your medical history, and a physical exam.  Blood  tests. You may also have imaging tests, such as an ultrasound or MRI. How is this treated? This condition is treated with medicine that replaces the thyroid hormones that your body does not make. After you begin treatment, it may take several weeks for symptoms to go away. Follow these instructions at home:  Take over-the-counter and prescription medicines only as told by your health care provider.  If you start taking any new medicines, tell your health care provider.  Keep all follow-up visits as told by your health care provider. This is important. ? As your condition improves, your dosage of thyroid hormone medicine may change. ? You will need to have blood tests regularly so that your health care provider can monitor your condition. Contact a health care provider if:  Your symptoms do not get better with treatment.  You are taking thyroid replacement medicine and you: ? Sweat a lot. ? Have tremors. ? Feel anxious. ? Lose weight rapidly. ? Cannot tolerate heat. ? Have emotional swings. ? Have diarrhea. ? Feel weak. Get help right away if you have:  Chest pain.  An irregular heartbeat.  A rapid heartbeat.  Difficulty breathing. Summary  Hypothyroidism is when the thyroid gland does not make enough of certain hormones (it is underactive).  When the thyroid is underactive, it produces too little of the hormones thyroxine (T4) and triiodothyronine (T3).  The most common cause is Hashimoto's disease, a disease in which the body's disease-fighting system (immune system) attacks the thyroid gland. The condition can also be caused by viral infections, medicine, pregnancy, or past   radiation treatment to the head or neck.  Symptoms may include weight gain, dry skin, constipation, feeling as though you do not have energy, and not being able to tolerate cold.  This condition is treated with medicine to replace the thyroid hormones that your body does not make. This information  is not intended to replace advice given to you by your health care provider. Make sure you discuss any questions you have with your health care provider. Document Revised: 06/08/2017 Document Reviewed: 06/06/2017 Elsevier Patient Education  2020 [employer] Inc.  

## 2020-01-21 LAB — T4, FREE: Free T4: 1.26 ng/dL (ref 0.82–1.77)

## 2020-01-21 LAB — TSH: TSH: 3.69 u[IU]/mL (ref 0.450–4.500)

## 2020-01-21 NOTE — Progress Notes (Signed)
Contacted via [place] morning [place].  Thyroid level, TSH, has much improved with low dose of Levothyroxine.  This is likely why you are feeling better.  Continue this low dose and we will recheck in 6 months.  Great news!!!  Have a wonderful day!! Keep being awesome!! Kindest regards, Bradon Fester

## 2020-02-10 IMAGING — MG DIGITAL SCREENING BILATERAL MAMMOGRAM WITH TOMO AND CAD
8 series · 8 of 24 positions shown · non-contrast
Comparison: Previous exam(s).

CLINICAL DATA: Screening.

EXAM:
DIGITAL SCREENING BILATERAL MAMMOGRAM WITH TOMO AND CAD

[R MLO synth-2D]
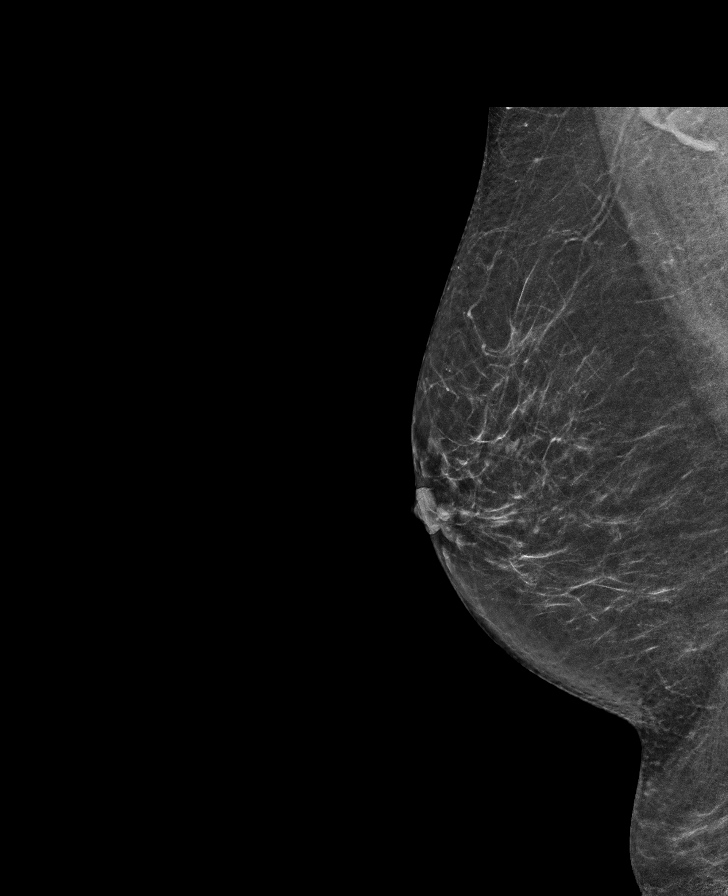

[R CC synth-2D]
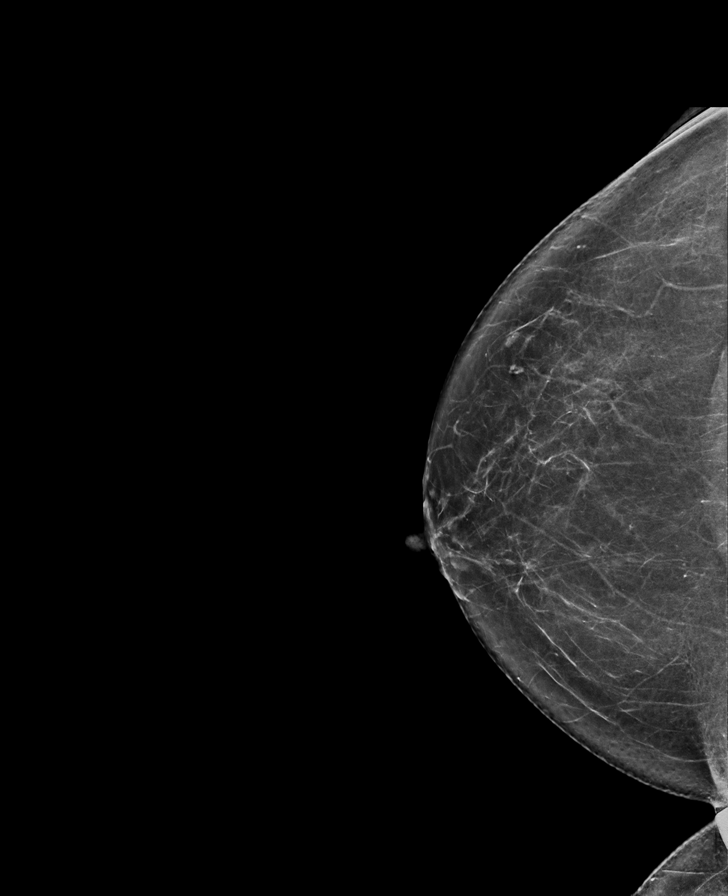

[L CC synth-2D]
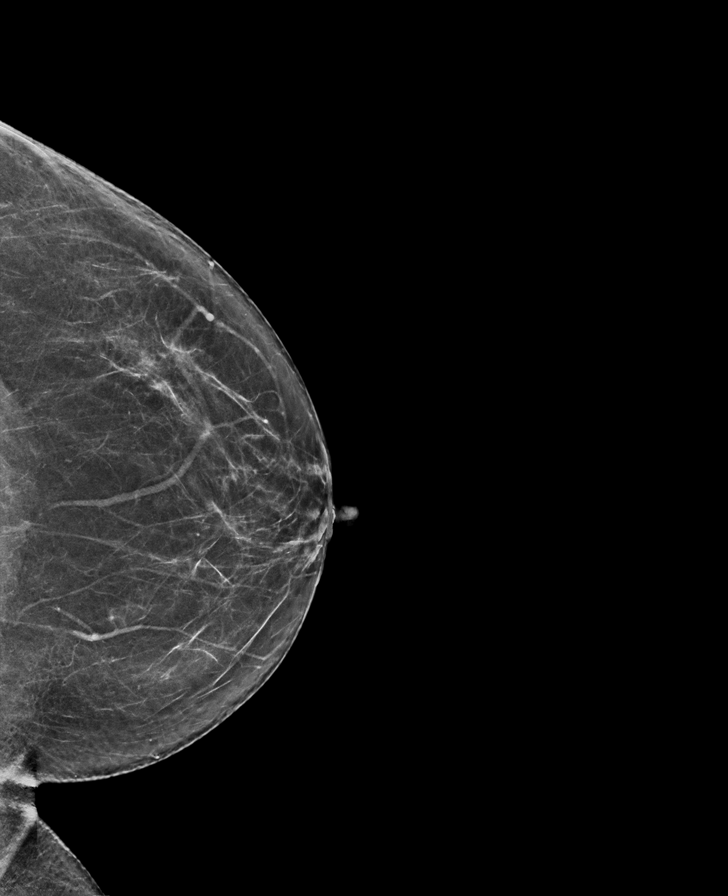

[L MLO synth-2D]
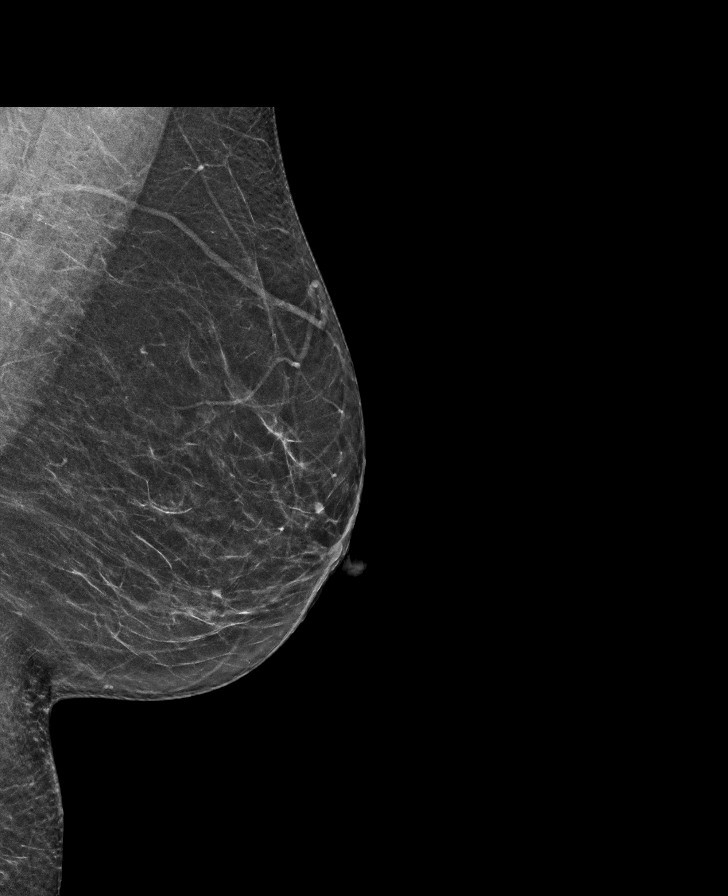

[R MLO tomo · tomo slice 33/66.0]
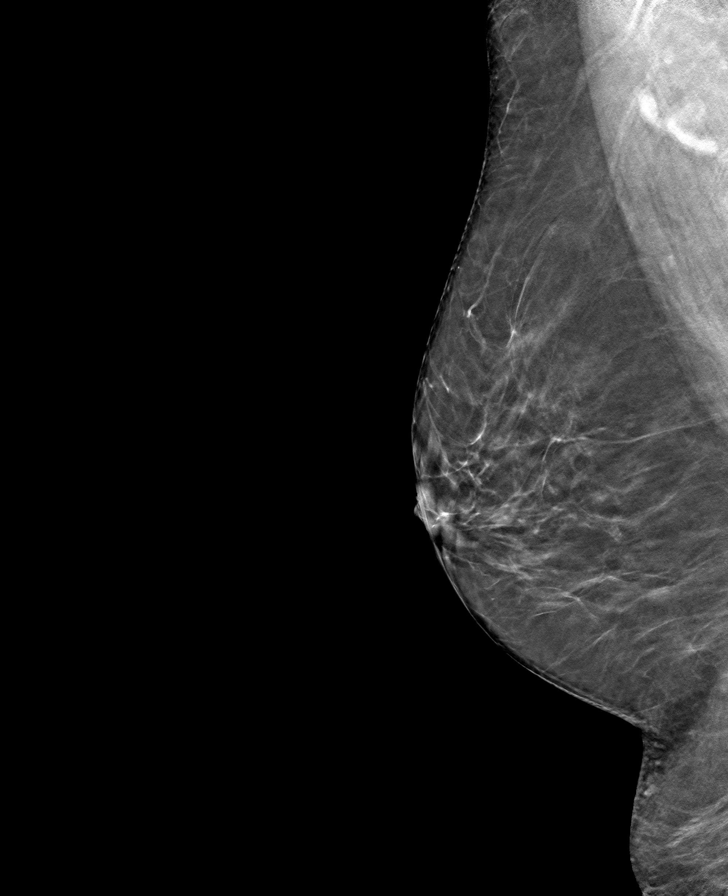

[L CC tomo · tomo slice 35/68.0]
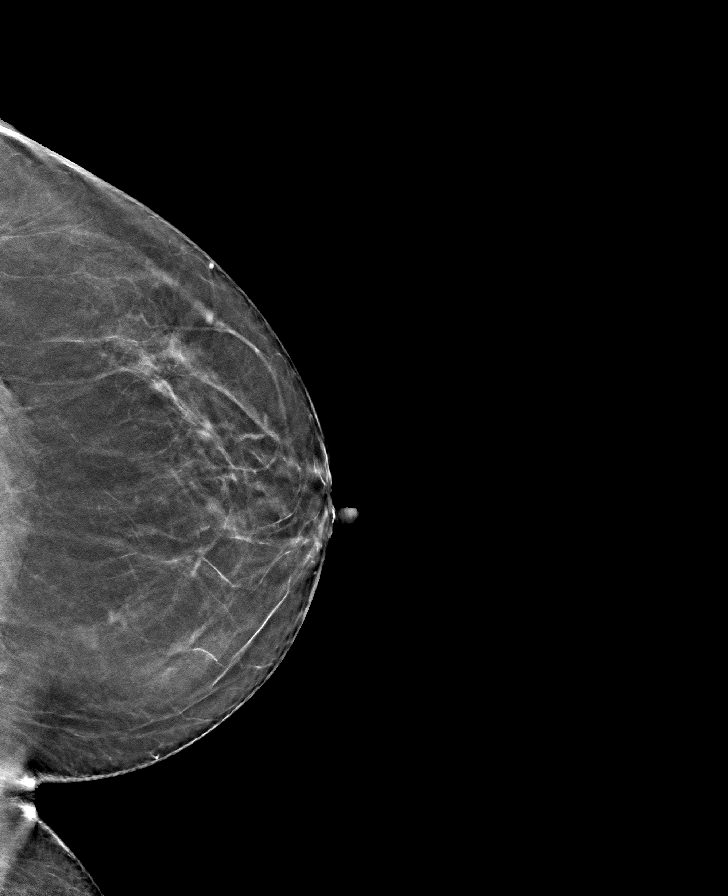

[R CC tomo · tomo slice 37/73.0]
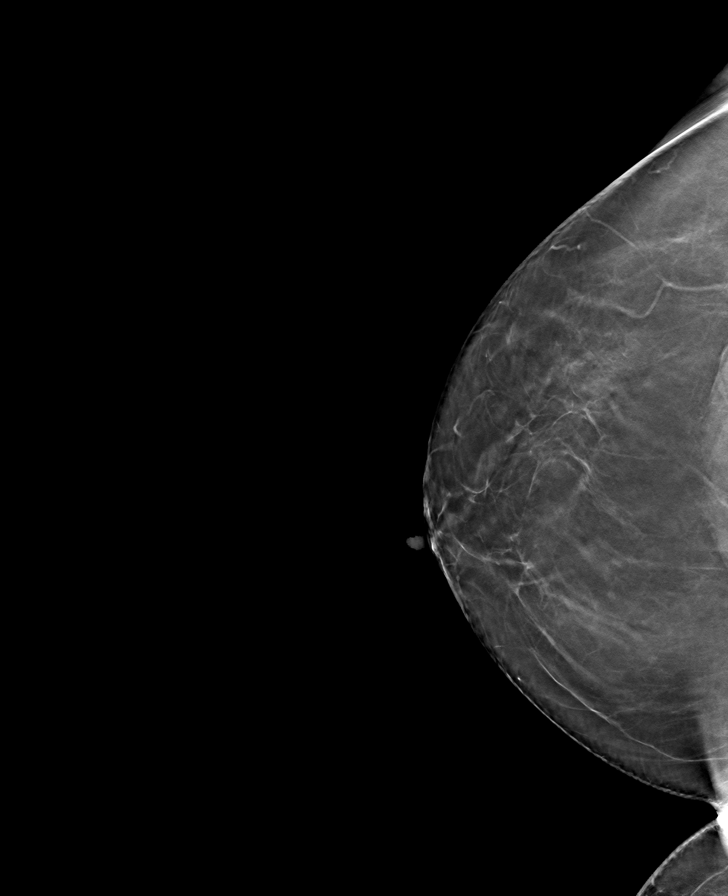

[L MLO tomo · tomo slice 33/66.0]
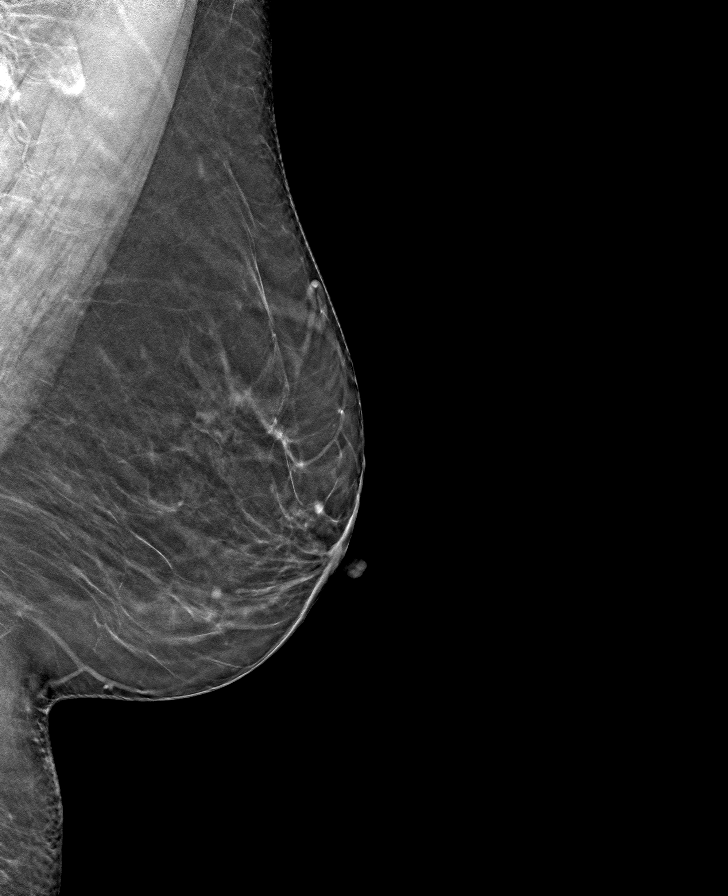

[8 of 24 positions shown; findings below may reference images not displayed]

ACR Breast Density Category b: There are scattered areas of
fibroglandular density.
FINDINGS: There are no findings suspicious for malignancy. Images were
processed with CAD.
IMPRESSION: No mammographic evidence of malignancy. A result letter of this
screening mammogram will be mailed directly to the patient.

RECOMMENDATION:
Screening mammogram in one year. (Code:[id number])

BI-RADS CATEGORY  1: Negative.

## 2020-03-01 DIAGNOSIS — F330 Major depressive disorder, recurrent, mild: Secondary | ICD-10-CM | POA: Diagnosis not present

## 2020-04-05 ENCOUNTER — Telehealth: Payer: Self-pay | Admitting: Nurse Practitioner

## 2020-04-05 DIAGNOSIS — Z1211 Encounter for screening for malignant neoplasm of colon: Secondary | ICD-10-CM

## 2020-04-05 NOTE — Telephone Encounter (Signed)
Cologuard please

## 2020-04-05 NOTE — Telephone Encounter (Signed)
Patient is calling because she received an emmi call. Patient is interested in cologuard. And want to see if Henrine Screws can order for her Please advise CB- [phone]

## 2020-04-06 NOTE — Telephone Encounter (Signed)
Cologuard order form filled out and placed in providers folder for signature.

## 2020-04-08 NOTE — Telephone Encounter (Signed)
Order signed and faxed to [employer]. Called and notified patient that this was done for her.

## 2020-04-19 DIAGNOSIS — Z1212 Encounter for screening for malignant neoplasm of rectum: Secondary | ICD-10-CM | POA: Diagnosis not present

## 2020-04-19 DIAGNOSIS — Z1211 Encounter for screening for malignant neoplasm of colon: Secondary | ICD-10-CM | POA: Diagnosis not present

## 2020-04-19 LAB — COLOGUARD: Cologuard: NEGATIVE

## 2020-04-28 LAB — COLOGUARD: COLOGUARD: NEGATIVE

## 2020-04-28 LAB — EXTERNAL GENERIC LAB PROCEDURE: COLOGUARD: NEGATIVE

## 2020-05-03 ENCOUNTER — Encounter: Payer: Self-pay | Admitting: Nurse Practitioner

## 2020-05-03 ENCOUNTER — Other Ambulatory Visit: Payer: Self-pay | Admitting: Nurse Practitioner

## 2020-05-03 DIAGNOSIS — R739 Hyperglycemia, unspecified: Secondary | ICD-10-CM

## 2020-05-12 ENCOUNTER — Encounter: Payer: BLUE CROSS/BLUE SHIELD | Admitting: Nurse Practitioner

## 2020-05-12 ENCOUNTER — Ambulatory Visit: Payer: BLUE CROSS/BLUE SHIELD

## 2020-05-31 ENCOUNTER — Ambulatory Visit (INDEPENDENT_AMBULATORY_CARE_PROVIDER_SITE_OTHER): Payer: Medicare Other

## 2020-05-31 VITALS — Ht 63.0 in | Wt 172.0 lb

## 2020-05-31 DIAGNOSIS — Z0000 Encounter for general adult medical examination without abnormal findings: Secondary | ICD-10-CM

## 2020-05-31 NOTE — Patient Instructions (Signed)
Michelle Espinoza , Thank you for taking time to come for your Medicare Wellness Visit. I appreciate your ongoing commitment to your health goals. Please review the following plan we discussed and let me know if I can assist you in the future.   Screening recommendations/referrals: Colonoscopy: cologuard 04/19/2020, due 04/20/2023 Mammogram: patient to schedule Bone Density: completed 04/09/2017 Recommended yearly ophthalmology/optometry visit for glaucoma screening and checkup Recommended yearly dental visit for hygiene and checkup  Vaccinations: Influenza vaccine: will get later Pneumococcal vaccine: completed 06/11/2018 Tdap vaccine: completed 01/09/2017, due 01/10/2027 Shingles vaccine: needs second dose of Shingrix   Covid-19: 10/07/2019, 09/08/2019  Advanced directives: Advance directive discussed with you today.   Conditions/risks identified: smoking  Next appointment: Follow up in one year for your annual wellness visit    Preventive Care 37 Years and Older, Female Preventive care refers to lifestyle choices and visits with your health care provider that can promote health and wellness. What does preventive care include?  A yearly physical exam. This is also called an annual well check.  Dental exams once or twice a year.  Routine eye exams. Ask your health care provider how often you should have your eyes checked.  Personal lifestyle choices, including:  Daily care of your teeth and gums.  Regular physical activity.  Eating a healthy diet.  Avoiding tobacco and drug use.  Limiting alcohol use.  Practicing safe sex.  Taking low-dose aspirin every day.  Taking vitamin and mineral supplements as recommended by your health care provider. What happens during an annual well check? The services and screenings done by your health care provider during your annual well check will depend on your age, overall health, lifestyle risk factors, and family history of  disease. Counseling  Your health care provider may ask you questions about your:  Alcohol use.  Tobacco use.  Drug use.  Emotional well-being.  Home and relationship well-being.  Sexual activity.  Eating habits.  History of falls.  Memory and ability to understand (cognition).  Work and work Statistician.  Reproductive health. Screening  You may have the following tests or measurements:  Height, weight, and BMI.  Blood pressure.  Lipid and cholesterol levels. These may be checked every 5 years, or more frequently if you are over 61 years old.  Skin check.  Lung cancer screening. You may have this screening every year starting at age 79 if you have a 30-pack-year history of smoking and currently smoke or have quit within the past 15 years.  Fecal occult blood test (FOBT) of the stool. You may have this test every year starting at age 33.  Flexible sigmoidoscopy or colonoscopy. You may have a sigmoidoscopy every 5 years or a colonoscopy every 10 years starting at age 51.  Hepatitis C blood test.  Hepatitis B blood test.  Sexually transmitted disease (STD) testing.  Diabetes screening. This is done by checking your blood sugar (glucose) after you have not eaten for a while (fasting). You may have this done every 1-3 years.  Bone density scan. This is done to screen for osteoporosis. You may have this done starting at age 86.  Mammogram. This may be done every 1-2 years. Talk to your health care provider about how often you should have regular mammograms. Talk with your health care provider about your test results, treatment options, and if necessary, the need for more tests. Vaccines  Your health care provider may recommend certain vaccines, such as:  Influenza vaccine. This is recommended every year.  Tetanus, diphtheria, and acellular pertussis (Tdap, Td) vaccine. You may need a Td booster every 10 years.  Zoster vaccine. You may need this after age  4.  Pneumococcal 13-valent conjugate (PCV13) vaccine. One dose is recommended after age 106.  Pneumococcal polysaccharide (PPSV23) vaccine. One dose is recommended after age 30. Talk to your health care provider about which screenings and vaccines you need and how often you need them. This information is not intended to replace advice given to you by your health care provider. Make sure you discuss any questions you have with your health care provider. Document Released: 07/23/2015 Document Revised: 03/15/2016 Document Reviewed: 04/27/2015 Elsevier Interactive Patient Education  2017 [employer] Prevention in the Home Falls can cause injuries. They can happen to people of all ages. There are many things you can do to make your home safe and to help prevent falls. What can I do on the outside of my home?  Regularly fix the edges of walkways and driveways and fix any cracks.  Remove anything that might make you trip as you walk through a door, such as a raised step or threshold.  Trim any bushes or trees on the path to your home.  Use bright outdoor lighting.  Clear any walking paths of anything that might make someone trip, such as rocks or tools.  Regularly check to see if handrails are loose or broken. Make sure that both sides of any steps have handrails.  Any raised decks and porches should have guardrails on the edges.  Have any leaves, snow, or ice cleared regularly.  Use sand or salt on walking paths during winter.  Clean up any spills in your garage right away. This includes oil or grease spills. What can I do in the bathroom?  Use night lights.  Install grab bars by the toilet and in the tub and shower. Do not use towel bars as grab bars.  Use non-skid mats or decals in the tub or shower.  If you need to sit down in the shower, use a plastic, non-slip stool.  Keep the floor dry. Clean up any water that spills on the floor as soon as it happens.  Remove  soap buildup in the tub or shower regularly.  Attach bath mats securely with double-sided non-slip rug tape.  Do not have throw rugs and other things on the floor that can make you trip. What can I do in the bedroom?  Use night lights.  Make sure that you have a light by your bed that is easy to reach.  Do not use any sheets or blankets that are too big for your bed. They should not hang down onto the floor.  Have a firm chair that has side arms. You can use this for support while you get dressed.  Do not have throw rugs and other things on the floor that can make you trip. What can I do in the kitchen?  Clean up any spills right away.  Avoid walking on wet floors.  Keep items that you use a lot in easy-to-reach places.  If you need to reach something above you, use a strong step stool that has a grab bar.  Keep electrical cords out of the way.  Do not use floor polish or wax that makes floors slippery. If you must use wax, use non-skid floor wax.  Do not have throw rugs and other things on the floor that can make you trip. What can I do  with my stairs?  Do not leave any items on the stairs.  Make sure that there are handrails on both sides of the stairs and use them. Fix handrails that are broken or loose. Make sure that handrails are as long as the stairways.  Check any carpeting to make sure that it is firmly attached to the stairs. Fix any carpet that is loose or worn.  Avoid having throw rugs at the top or bottom of the stairs. If you do have throw rugs, attach them to the floor with carpet tape.  Make sure that you have a light switch at the top of the stairs and the bottom of the stairs. If you do not have them, ask someone to add them for you. What else can I do to help prevent falls?  Wear shoes that:  Do not have high heels.  Have rubber bottoms.  Are comfortable and fit you well.  Are closed at the toe. Do not wear sandals.  If you use a  stepladder:  Make sure that it is fully opened. Do not climb a closed stepladder.  Make sure that both sides of the stepladder are locked into place.  Ask someone to hold it for you, if possible.  Clearly mark and make sure that you can see:  Any grab bars or handrails.  First and last steps.  Where the edge of each step is.  Use tools that help you move around (mobility aids) if they are needed. These include:  Canes.  Walkers.  Scooters.  Crutches.  Turn on the lights when you go into a dark area. Replace any light bulbs as soon as they burn out.  Set up your furniture so you have a clear path. Avoid moving your furniture around.  If any of your floors are uneven, fix them.  If there are any pets around you, be aware of where they are.  Review your medicines with your doctor. Some medicines can make you feel dizzy. This can increase your chance of falling. Ask your doctor what other things that you can do to help prevent falls. This information is not intended to replace advice given to you by your health care provider. Make sure you discuss any questions you have with your health care provider. Document Released: 04/22/2009 Document Revised: 12/02/2015 Document Reviewed: 07/31/2014 Elsevier Interactive Patient Education  2017 [employer].

## 2020-05-31 NOTE — Progress Notes (Signed)
I connected with Michelle Espinoza today by telephone and verified that I am speaking with the correct person using two identifiers. Location patient: home Location provider: work Persons participating in the virtual visit: Michelle Espinoza, Michelle Durand Espinoza.   I discussed the limitations, risks, security and privacy concerns of performing an evaluation and management service by telephone and the availability of in person appointments. I also discussed with the patient that there may be a patient responsible charge related to this service. The patient expressed understanding and verbally consented to this telephonic visit.    Interactive audio and video telecommunications were attempted between this provider and patient, however failed, due to patient having technical difficulties OR patient did not have access to video capability.  We continued and completed visit with audio only.     Vital signs may be patient reported or missing.  Subjective:   Michelle Espinoza is a 69 y.o. female who presents for Medicare Annual (Subsequent) preventive examination.  Review of Systems     Cardiac Risk Factors include: advanced age (>46men, >80 women);obesity (BMI >30kg/m2);sedentary lifestyle;smoking/ tobacco exposure;hypertension;dyslipidemia     Objective:    Today's Vitals   05/31/20 1256  Weight: 172 lb (78 kg)  Height: 5\' 3"  (1.6 m)   Body mass index is 30.47 kg/m.  Advanced Directives 05/31/2020 01/23/2019 07/01/2018 01/18/2018  Does Patient Have a Medical Advance Directive? No No No No  Would patient like information on creating a medical advance directive? - - No - Patient declined Yes (MAU/Ambulatory/Procedural Areas - Information given)    Current Medications (verified) Outpatient Encounter Medications as of 05/31/2020  Medication Sig  . aspirin EC 81 MG tablet Take 81 mg by mouth daily.  Marland Kitchen buPROPion (WELLBUTRIN SR) 100 MG 12 hr tablet Take 100 mg by mouth in the morning and at bedtime.   . [facility] VITAMIN B12 1000 MCG tablet Take 1,000 mcg by mouth in the morning and at bedtime.  Marland Kitchen ezetimibe (ZETIA) 10 MG tablet Take 1 tablet (10 mg total) by mouth daily.  Marland Kitchen FLUoxetine (PROZAC) 40 MG capsule Take 40 mg by mouth daily.   Marland Kitchen gabapentin (NEURONTIN) 300 MG capsule Take 1 capsule (300 mg total) by mouth daily. Start 1 bedtime for 1 week then 2 bedtime for 1 week and if needed 2 bedtime and 1 in morning  . levothyroxine (SYNTHROID) 25 MCG tablet Take 1 tablet (25 mcg total) by mouth daily before breakfast.  . losartan-hydrochlorothiazide (HYZAAR) 100-12.5 MG tablet Take 1 tablet by mouth daily.  . Multiple Vitamin (MULTIVITAMIN) tablet Take 1 tablet by mouth daily.  . pravastatin (PRAVACHOL) 20 MG tablet Take 1 tablet (20 mg total) by mouth daily.  . meloxicam (MOBIC) 15 MG tablet Take 1 tablet (15 mg total) by mouth daily. (Patient not taking: Reported on 11/07/2019)   No facility-administered encounter medications on file as of 05/31/2020.    Allergies (verified) Lovastatin and Penicillins   History: Past Medical History:  Diagnosis Date  . Allergy   . Depression   . Essential hypertension   . Hyperlipidemia   . Restless legs    Past Surgical History:  Procedure Laterality Date  . ABDOMINAL HYSTERECTOMY     Family History  Adopted: Yes  Problem Relation Age of Onset  . Breast cancer Neg Hx    Social History   Socioeconomic History  . Marital status: Widowed    Spouse name: Not on file  . Number of children: Not on file  . Years of  education: 53  . Highest education level: High school graduate  Occupational History  . Occupation: retired  Tobacco Use  . Smoking status: Current Every Day Smoker    Packs/day: 0.50    Years: 49.00    Pack years: 24.50    Types: Cigarettes  . Smokeless tobacco: Never Used  Vaping Use  . Vaping Use: Never used  Substance and Sexual Activity  . Alcohol use: Yes    Comment: occasionally  . Drug use: No  . Sexual activity: Not  on file  Other Topics Concern  . Not on file  Social History Narrative  . Not on file   Social Determinants of Health   Financial Resource Strain: Low Risk   . Difficulty of Paying Living Expenses: Not hard at all  Food Insecurity: No Food Insecurity  . Worried About Charity fundraiser in the Last Year: Never true  . Ran Out of Food in the Last Year: Never true  Transportation Needs: No Transportation Needs  . Lack of Transportation (Medical): No  . Lack of Transportation (Non-Medical): No  Physical Activity: Inactive  . Days of Exercise per Week: 0 days  . Minutes of Exercise per Session: 0 min  Stress: No Stress Concern Present  . Feeling of Stress : Not at all  Social Connections:   . Frequency of Communication with Friends and Family: Not on file  . Frequency of Social Gatherings with Friends and Family: Not on file  . Attends Religious Services: Not on file  . Active Member of Clubs or Organizations: Not on file  . Attends Archivist Meetings: Not on file  . Marital Status: Not on file    Tobacco Counseling Ready to quit: No Counseling given: Not Answered   Clinical Intake:  Pre-visit preparation completed: Yes  Pain : No/denies pain     Nutritional Status: BMI > 30  Obese Nutritional Risks: None Diabetes: No  How often do you need to have someone help you when you read instructions, pamphlets, or other written materials from your doctor or pharmacy?: 1 - Never What is the last grade level you completed in school?: 12th grade  Diabetic? no  Interpreter Needed?: No  Information entered by :: Michelle Espinoza   Activities of Daily Living In your present state of health, do you have any difficulty performing the following activities: 05/31/2020  Hearing? N  Vision? N  Difficulty concentrating or making decisions? N  Walking or climbing stairs? Y  Comment sometimes  Dressing or bathing? N  Doing errands, shopping? N  Preparing Food and eating ?  N  Using the Toilet? N  In the past six months, have you accidently leaked urine? N  Do you have problems with loss of bowel control? N  Managing your Medications? N  Managing your Finances? N  Housekeeping or managing your Housekeeping? N  Some recent data might be hidden    Patient Care Team: Venita Lick, NP as PCP - General (Nurse Practitioner) Chauncey Mann, MD as Referring Physician (Psychiatry)  Indicate any recent Medical Services you may have received from other than [facility] providers in the past year (date may be approximate).     Assessment:   This is a routine wellness examination for Michelle Espinoza.  Hearing/Vision screen  Hearing Screening   125Hz  250Hz  500Hz  1000Hz  2000Hz  3000Hz  4000Hz  6000Hz  8000Hz   Right ear:           Left ear:  Vision Screening Comments: No regular eye exams, [facility]  Dietary issues and exercise activities discussed: Current Exercise Habits: The patient does not participate in regular exercise at present  Goals    . Patient Stated     05/31/2020, to keep weight from increasing    . Quit Smoking     Smoking cessation discussed      Depression Screen PHQ 2/9 Scores 05/31/2020 11/07/2019 05/09/2019 01/23/2019 07/30/2018 01/18/2018 07/17/2017  PHQ - 2 Score 0 0 0 0 0 0 0  PHQ- 9 Score 0 2 0 - 0 - -    Fall Risk Fall Risk  05/31/2020 05/09/2019 01/23/2019 01/18/2018 07/17/2017  Falls in the past year? 0 0 0 No No  Number falls in past yr: - 0 - - -  Injury with Fall? - 0 - - -  Risk for fall due to : Medication side effect - - - -  Follow up Falls evaluation completed;Education provided;Falls prevention discussed - - - -    Any stairs in or around the home? Yes  If so, are there any without handrails? No  Home free of loose throw rugs in walkways, pet beds, electrical cords, etc? Yes  Adequate lighting in your home to reduce risk of falls? Yes   ASSISTIVE DEVICES UTILIZED TO PREVENT FALLS:  Life alert? No  Use of a cane, walker  or w/c? No  Grab bars in the bathroom? No  Shower chair or bench in shower? No  Elevated toilet seat or a handicapped toilet? No   TIMED UP AND GO:  Was the test performed? No .    Cognitive Function:     6CIT Screen 05/31/2020 01/23/2019 01/18/2018  What Year? 0 points 0 points 0 points  What month? 0 points 0 points 0 points  What time? 0 points 0 points 0 points  Count back from 20 0 points 0 points 0 points  Months in reverse 0 points 0 points 0 points  Repeat phrase 0 points 0 points 0 points  Total Score 0 0 0    Immunizations Immunization History  Administered Date(s) Administered  . Fluad Quad(high Dose 65+) 05/09/2019  . Influenza, High Dose Seasonal PF 06/11/2017  . Influenza,inj,Quad PF,6+ Mos 06/17/2015, 05/29/2016, 03/07/2018  . Moderna SARS-COVID-2 Vaccination 09/08/2019, 10/07/2019  . Pneumococcal Conjugate-13 06/11/2018  . Pneumococcal Polysaccharide-23 06/11/2017  . Td 12/13/2006, 01/09/2017  . Zoster 07/07/2013  . Zoster Recombinat (Shingrix) 03/13/2018    TDAP status: Up to date Flu Vaccine status: will get later Pneumococcal vaccine status: Up to date Covid-19 vaccine status: Completed vaccines  Qualifies for Shingles Vaccine? Yes   Zostavax completed Yes   Shingrix Completed?: needs second  Screening Tests Health Maintenance  Topic Date Due  . INFLUENZA VACCINE  02/08/2020  . MAMMOGRAM  05/28/2021  . Fecal DNA (Cologuard)  04/20/2023  . TETANUS/TDAP  01/10/2027  . DEXA SCAN  Completed  . COVID-19 Vaccine  Completed  . Hepatitis C Screening  Completed  . PNA vac Low Risk Adult  Completed    Health Maintenance  Health Maintenance Due  Topic Date Due  . INFLUENZA VACCINE  02/08/2020    Colorectal cancer screening: Completed 04/19/2020. Repeat every 3 years Mammogram status: Completed 05/29/2019. Repeat every year Bone Density status: Completed 04/09/2017. Results reflect: Bone density results: NORMAL. Repeat every 0 years.  Lung  Cancer Screening: (Low Dose CT Chest recommended if Age 69-80 years, 30 pack-year currently smoking OR have quit w/in 15years.) does not qualify.  Lung Cancer Screening Referral: no  Additional Screening:  Hepatitis C Screening: does qualify; Completed 11/16/2015  Vision Screening: Recommended annual ophthalmology exams for early detection of glaucoma and other disorders of the eye. Is the patient up to date with their annual eye exam?  No  Who is the provider or what is the name of the office in which the patient attends annual eye exams? [facility] If pt is not established with a provider, would they like to be referred to a provider to establish care? No .   Dental Screening: Recommended annual dental exams for proper oral hygiene  Community Resource Referral / Chronic Care Management: CRR required this visit?  No   CCM required this visit?  No      Plan:     I have personally reviewed and noted the following in the patient's chart:   . Medical and social history . Use of alcohol, tobacco or illicit drugs  . Current medications and supplements . Functional ability and status . Nutritional status . Physical activity . Advanced directives . List of other physicians . Hospitalizations, surgeries, and ER visits in previous 12 months . Vitals . Screenings to include cognitive, depression, and falls . Referrals and appointments  In addition, I have reviewed and discussed with patient certain preventive protocols, quality metrics, and best practice recommendations. A written personalized care plan for preventive services as well as general preventive health recommendations were provided to patient.     Michelle Simmering, Espinoza   [id number]   Nurse Notes: Patient is not ready to quit smoking at t his time.

## 2020-06-21 ENCOUNTER — Telehealth: Payer: Self-pay | Admitting: *Deleted

## 2020-06-21 NOTE — Telephone Encounter (Signed)
Attempted to contact and schedule lung screening scan. Message left for patient to call back to schedule. 

## 2020-06-25 ENCOUNTER — Other Ambulatory Visit: Payer: Self-pay | Admitting: Nurse Practitioner

## 2020-06-25 DIAGNOSIS — F330 Major depressive disorder, recurrent, mild: Secondary | ICD-10-CM | POA: Diagnosis not present

## 2020-06-25 DIAGNOSIS — E782 Mixed hyperlipidemia: Secondary | ICD-10-CM

## 2020-06-25 MED ORDER — PRAVASTATIN SODIUM 20 MG PO TABS: 20.0000 mg | ORAL_TABLET | Freq: Every day | ORAL | 4 refills | Status: DC

## 2020-06-25 MED ORDER — EZETIMIBE 10 MG PO TABS: 10.0000 mg | ORAL_TABLET | Freq: Every day | ORAL | 4 refills | Status: DC

## 2020-06-25 NOTE — Telephone Encounter (Signed)
Can these be refilled?

## 2020-06-25 NOTE — Telephone Encounter (Signed)
Requested medication (s) are due for refill today: Yes  Requested medication (s) are on the active medication list: Yes  Last refill:  01/10/19  Future visit scheduled: No  Notes to clinic:  Unable to refill per protocol, expired Rx     Requested Prescriptions  Pending Prescriptions Disp Refills   pravastatin (PRAVACHOL) 20 MG tablet 90 tablet 4    Sig: Take 1 tablet (20 mg total) by mouth daily.      Cardiovascular:  Antilipid - Statins Failed - 06/25/2020 11:25 AM      Failed - Total Cholesterol in normal range and within 360 days    Cholesterol, Total  Date Value Ref Range Status  11/07/2019 208 (H) 100 - 199 mg/dL Final   Cholesterol Piccolo, Waived  Date Value Ref Range Status  07/30/2018 206 (H) <200 mg/dL Final    Comment:                            Desirable                <200                         Borderline High      200- 239                         High                     >239           Failed - LDL in normal range and within 360 days    LDL Chol Calc (NIH)  Date Value Ref Range Status  11/07/2019 104 (H) 0 - 99 mg/dL Final          Failed - Triglycerides in normal range and within 360 days    Triglycerides  Date Value Ref Range Status  11/07/2019 365 (H) 0 - 149 mg/dL Final   Triglycerides Piccolo,Waived  Date Value Ref Range Status  07/30/2018 100 <150 mg/dL Final    Comment:                            Normal                   <150                         Borderline High     150 - 199                         High                200 - 499                         Very High                >499           Passed - HDL in normal range and within 360 days    HDL  Date Value Ref Range Status  11/07/2019 42 >39 mg/dL Final          Passed - Patient is not pregnant      Passed - Valid encounter within last 12  months    Recent Outpatient Visits           5 months ago Hypothyroidism, unspecified type   [facility] [place],  Barbaraann Faster, NP   6 months ago Elevated TSH   [place], [place] T, NP   7 months ago Recurrent major depressive disorder, in full remission ([place])   [place], Barbaraann Faster, NP   1 year ago Encounter for physical examination   [place], Henrine Screws T, NP   1 year ago Mixed hyperlipidemia   [place], MD       Future Appointments             In 11 months [place], [facility]                ezetimibe (ZETIA) 10 MG tablet 90 tablet 4    Sig: Take 1 tablet (10 mg total) by mouth daily.      Cardiovascular:  Antilipid - Sterol Transport Inhibitors Failed - 06/25/2020 11:25 AM      Failed - Total Cholesterol in normal range and within 360 days    Cholesterol, Total  Date Value Ref Range Status  11/07/2019 208 (H) 100 - 199 mg/dL Final   Cholesterol Piccolo, Waived  Date Value Ref Range Status  07/30/2018 206 (H) <200 mg/dL Final    Comment:                            Desirable                <200                         Borderline High      200- 239                         High                     >239           Failed - LDL in normal range and within 360 days    LDL Chol Calc (NIH)  Date Value Ref Range Status  11/07/2019 104 (H) 0 - 99 mg/dL Final          Failed - Triglycerides in normal range and within 360 days    Triglycerides  Date Value Ref Range Status  11/07/2019 365 (H) 0 - 149 mg/dL Final   Triglycerides Piccolo,Waived  Date Value Ref Range Status  07/30/2018 100 <150 mg/dL Final    Comment:                            Normal                   <150                         Borderline High     150 - 199                         High                200 - 499  Very High                >499           Passed - HDL in normal range and within 360 days    HDL  Date Value Ref Range Status  11/07/2019 42 >39 mg/dL Final           Passed - Valid encounter within last 12 months    Recent Outpatient Visits           5 months ago Hypothyroidism, unspecified type   [place], Barbaraann Faster, NP   6 months ago Elevated TSH   [place], [place] T, NP   7 months ago Recurrent major depressive disorder, in full remission ([place])   [place], Barbaraann Faster, NP   1 year ago Encounter for physical examination   [place], Barbaraann Faster, NP   1 year ago Mixed hyperlipidemia   [place], Jeannette How, MD       Future Appointments             In 8 months [place], [place]

## 2020-06-25 NOTE — Telephone Encounter (Signed)
Patient requesting pravastatin (PRAVACHOL) 20 MG tablet and ezetimibe (ZETIA) 10 MG tablet , informed please allow 48 to 72 hour turn around time.  Also, patient would like to know if she is up to date on her vaccinations such as pneumonia,TB.   Patient would like know if she can have the flu shot and the pneumonia done together.      [id number] [address], [place] Phone:  [phone]  Fax:  [phone]

## 2020-06-25 NOTE — Telephone Encounter (Signed)
Patient called to answer questions about vaccines. Advised it's fine to receive both Pneumonia and Influenza together, however she has already received Pnuemonia vaccines listed on the immunization record. Appointment scheduled for Monday, 06/28/20 at 0900 with Nurse to receive Influenza vaccine. Advised to speak with the nurse about the TB vaccine and if she's due for another Pneumonia vaccine. Patient verbalized understanding.

## 2020-06-28 ENCOUNTER — Other Ambulatory Visit: Payer: Self-pay

## 2020-06-28 ENCOUNTER — Ambulatory Visit (INDEPENDENT_AMBULATORY_CARE_PROVIDER_SITE_OTHER): Payer: Medicare Other

## 2020-06-28 DIAGNOSIS — Z23 Encounter for immunization: Secondary | ICD-10-CM | POA: Diagnosis not present

## 2020-07-07 DIAGNOSIS — Z23 Encounter for immunization: Secondary | ICD-10-CM | POA: Diagnosis not present

## 2020-07-12 ENCOUNTER — Telehealth: Payer: Self-pay | Admitting: *Deleted

## 2020-07-12 NOTE — Telephone Encounter (Signed)
Attempted to contact and schedule lung screening scan. Message left for patient to call back to schedule. 

## 2020-09-01 ENCOUNTER — Telehealth: Payer: Self-pay | Admitting: *Deleted

## 2020-09-01 NOTE — Telephone Encounter (Signed)
Attempted to contact and schedule lung screening left message to call back at [phone]

## 2020-09-14 ENCOUNTER — Other Ambulatory Visit: Payer: Self-pay

## 2020-09-14 ENCOUNTER — Ambulatory Visit (INDEPENDENT_AMBULATORY_CARE_PROVIDER_SITE_OTHER): Payer: Medicare Other | Admitting: Nurse Practitioner

## 2020-09-14 ENCOUNTER — Encounter: Payer: Self-pay | Admitting: Nurse Practitioner

## 2020-09-14 VITALS — BP 115/83 | HR 84 | Temp 98.7°F | Wt 166.4 lb

## 2020-09-14 DIAGNOSIS — N760 Acute vaginitis: Secondary | ICD-10-CM | POA: Insufficient documentation

## 2020-09-14 DIAGNOSIS — N898 Other specified noninflammatory disorders of vagina: Secondary | ICD-10-CM | POA: Insufficient documentation

## 2020-09-14 DIAGNOSIS — B9689 Other specified bacterial agents as the cause of diseases classified elsewhere: Secondary | ICD-10-CM | POA: Diagnosis not present

## 2020-09-14 DIAGNOSIS — B372 Candidiasis of skin and nail: Secondary | ICD-10-CM | POA: Insufficient documentation

## 2020-09-14 LAB — WET PREP FOR TRICH, YEAST, CLUE
Clue Cell Exam: POSITIVE — AB
Trichomonas Exam: NEGATIVE
Yeast Exam: NEGATIVE

## 2020-09-14 MED ORDER — METRONIDAZOLE 500 MG PO TABS: 500.0000 mg | ORAL_TABLET | Freq: Two times a day (BID) | ORAL | 0 refills | Status: AC

## 2020-09-14 MED ORDER — FLUCONAZOLE 150 MG PO TABS: ORAL_TABLET | ORAL | 0 refills | Status: DC

## 2020-09-14 MED ORDER — NYSTATIN 100000 UNIT/GM EX POWD: 1.0000 "application " | Freq: Three times a day (TID) | CUTANEOUS | 2 refills | Status: DC

## 2020-09-14 MED ORDER — NYSTATIN 100000 UNIT/GM EX CREA: 1.0000 "application " | TOPICAL_CREAM | Freq: Two times a day (BID) | CUTANEOUS | 1 refills | Status: DC

## 2020-09-14 NOTE — Patient Instructions (Signed)
Bacterial Vaginosis  Bacterial vaginosis is an infection of the vagina. It happens when too many normal germs (healthy bacteria) grow in the vagina. This infection can make it easier to get other infections from sex (STIs). It is very important for pregnant women to get treated. This infection can cause babies to be born early or at a low birth weight. What are the causes? This infection is caused by an increase in certain germs that grow in the vagina. You cannot get this infection from toilet seats, bedsheets, swimming pools, or things that touch your vagina. What increases the risk?  Having sex with a new person or more than one person.  Having sex without protection.  Douching.  Having an intrauterine device (IUD).  Smoking.  Using drugs or drinking alcohol. These can lead you to do things that are risky.  Taking certain antibiotic medicines.  Being pregnant. What are the signs or symptoms? Some women have no symptoms. Symptoms may include:  A discharge from your vagina. It may be gray or white. It can be watery or foamy.  A fishy smell. This can happen after sex or during your menstrual period.  Itching in and around your vagina.  A feeling of burning or pain when you pee (urinate). How is this treated? This infection is treated with antibiotic medicines. These may be given to you as:  A pill.  A cream for your vagina.  A medicine that you put into your vagina (suppository). If the infection comes back after treatment, you may need more antibiotics. Follow these instructions at home: Medicines  Take over-the-counter and prescription medicines as told by your doctor.  Take or use your antibiotic medicine as told by your doctor. Do not stop taking or using it, even if you start to feel better. General instructions  If the person you have sex with is a woman, tell her that you have this infection. She will need to follow up with her doctor. If you have a female  partner, he does not need to be treated.  Do not have sex until you finish treatment.  Drink enough fluid to keep your pee pale yellow.  Keep your vagina and butt clean. ? Wash the area with warm water each day. ? Wipe from front to back after you use the toilet.  If you are breastfeeding a baby, ask your doctor if you should keep doing so during treatment.  Keep all follow-up visits. How is this prevented? Self-care  Do not douche.  Use only warm water to wash around your vagina.  Wear underwear that is cotton or lined with cotton.  Do not wear tight pants and pantyhose, especially in the summer. Safe sex  Use protection when you have sex. This includes: ? Use condoms. ? Use dental dams. This is a thin layer that protects the mouth during oral sex.  Limit how many people you have sex with. To prevent this infection, it is best to have sex with just one person.  Get tested for STIs. The person you have sex with should also get tested. Drugs and alcohol  Do not smoke or use any products that contain nicotine or tobacco. If you need help quitting, ask your doctor.  Do not use drugs.  Do not drink alcohol if: ? Your doctor tells you not to drink. ? You are pregnant, may be pregnant, or are planning to become pregnant.  If you drink alcohol: ? Limit how much you have to 0-1 drink   a day. ? Know how much alcohol is in your drink. In the [place] one drink equals one 12 oz bottle of beer (355 mL), one 5 oz glass of wine (148 mL), or one 1 oz glass of hard liquor (44 mL). Where to find more information  Centers for Disease Control and Prevention: www.cdc.gov  American Sexual Health Association: www.ashastd.org  Office on Women's Health: www.womenshealth.gov Contact a doctor if:  Your symptoms do not get better, even after you are treated.  You have more discharge or pain when you pee.  You have a fever or chills.  You have pain in your belly (abdomen) or in the area  between your hips.  You have pain with sex.  You bleed from your vagina between menstrual periods. Summary  This infection can happen when too many germs (bacteria) grow in the vagina.  This infection can make it easier to get infections from sex (STIs). Treating this can lower that chance.  Get treated if you are pregnant. This infection can cause babies to be born early.  Do not stop taking or using your antibiotic medicine, even if you start to feel better. This information is not intended to replace advice given to you by your health care provider. Make sure you discuss any questions you have with your health care provider. Document Revised: 12/25/2019 Document Reviewed: 12/25/2019 Elsevier Patient Education  2021 Elsevier Inc.  

## 2020-09-14 NOTE — Assessment & Plan Note (Signed)
Acute and noted on wet prep + clue cells.  Educated her on this and treatment.  Flagyl 500 MG BID x 7 days sent in.  Recommend she ensure clean hands with partner during sexual intercourse and external manipulation.  Return to office for worsening or ongoing.

## 2020-09-14 NOTE — Progress Notes (Signed)
BP 115/83   Pulse 84   Temp 98.7 F (37.1 C) (Oral)   Wt 166 lb 6.4 oz (75.5 kg)   SpO2 96%   BMI 29.48 kg/m    Subjective:    Patient ID: Michelle Espinoza, female    DOB: 01/09/1951, 70 y.o.   MRN: [id number]  HPI: Michelle Espinoza is a 70 y.o. female  Chief Complaint  Patient presents with  . Vaginal Itching    Patient states she has been having this issue for 3 weeks and states she has tried over the counter medications and nothing has helped. Patient states in the creases of her thighs (groin area) she has had some itching and states she does have moisture down there when she wakes up in the morning. Patient states she is not sure where it came from all of a sudden.    VAGINAL DISCHARGE Started with itching about 3 weeks ago to vaginal area.  She tried different soaps and products for yeast and itching.  They feel like they are working for short period and then stop. Duration: weeks Discharge description: mucous  Pruritus: yes Dysuria: no Malodorous: worse after sex Urinary frequency: no Fevers: no Abdominal pain: no  Sexual activity: monogamous History of sexually transmitted diseases: no Recent antibiotic use: no Context: no change  Treatments attempted: antifungal and vagisil  Relevant past medical, surgical, family and social history reviewed and updated as indicated. Interim medical history since our last visit reviewed. Allergies and medications reviewed and updated.  Review of Systems  Constitutional: Negative for activity change, appetite change, diaphoresis, fatigue and fever.  Respiratory: Negative for cough, chest tightness and shortness of breath.   Cardiovascular: Negative for chest pain, palpitations and leg swelling.  Gastrointestinal: Negative.   Genitourinary: Positive for vaginal discharge. Negative for dysuria, frequency, hematuria, urgency, vaginal bleeding and vaginal pain.  Neurological: Negative.   Psychiatric/Behavioral: Negative.     Per  HPI unless specifically indicated above     Objective:    BP 115/83   Pulse 84   Temp 98.7 F (37.1 C) (Oral)   Wt 166 lb 6.4 oz (75.5 kg)   SpO2 96%   BMI 29.48 kg/m   Wt Readings from Last 3 Encounters:  09/14/20 166 lb 6.4 oz (75.5 kg)  05/31/20 172 lb (78 kg)  01/20/20 171 lb 12.8 oz (77.9 kg)    Physical Exam Vitals and nursing note reviewed.  Constitutional:      General: She is awake. She is not in acute distress.    Appearance: She is well-developed, well-groomed and overweight. She is not ill-appearing.  HENT:     Head: Normocephalic.     Right Ear: Hearing normal.     Left Ear: Hearing normal.  Eyes:     General: Lids are normal.        Right eye: No discharge.        Left eye: No discharge.     Conjunctiva/sclera: Conjunctivae normal.     Pupils: Pupils are equal, round, and reactive to light.  Neck:     Vascular: No carotid bruit.  Cardiovascular:     Rate and Rhythm: Normal rate and regular rhythm.     Heart sounds: Normal heart sounds. No murmur heard. No gallop.   Pulmonary:     Effort: Pulmonary effort is normal. No accessory muscle usage or respiratory distress.     Breath sounds: Normal breath sounds.  Abdominal:     General: Bowel sounds  are normal.     Palpations: Abdomen is soft.     Tenderness: There is no abdominal tenderness. There is no right CVA tenderness or left CVA tenderness.     Hernia: There is no hernia in the left inguinal area or right inguinal area.  Genitourinary:    Exam position: Supine.     Pubic Area: Rash present.     Comments: External exam only, chaperone declined.  Erythema, shiny in appearance to bilateral upper thigh creases + cottage cheese like discharge noted to labia.  Musculoskeletal:     Cervical back: Normal range of motion and neck supple.     Right lower leg: No edema.     Left lower leg: No edema.  Skin:    General: Skin is warm and dry.     Comments: Shiny, erythema around entire umbilicus with  irritated appearance.  Neurological:     Mental Status: She is alert and oriented to person, place, and time.  Psychiatric:        Attention and Perception: Attention normal.        Mood and Affect: Mood normal.        Behavior: Behavior normal. Behavior is cooperative.        Thought Content: Thought content normal.        Judgment: Judgment normal.    Results for orders placed or performed in visit on 04/28/20  Cologuard  Result Value Ref Range   Cologuard Negative Negative      Assessment & Plan:   Problem List Items Addressed This Visit      Musculoskeletal and Integument   Skin yeast infection - Primary    To umbilicus and external vagina x 3 weeks with no improvement.  Wet prep negative for vaginal yeast + for clue cells.  Will send in Diflucan x 4 tablets, educated her to take one this week and if no improvement may repeat weekly x 4.  Nystatin powder for vaginal area and Nystatin cream for umbilicus sent.  Educated her on use of these.  All questions answered.  Educated her on yeast and to keep area dry.  Return for worsening or ongoing.      Relevant Medications   fluconazole (DIFLUCAN) 150 MG tablet   nystatin (MYCOSTATIN/NYSTOP) powder   nystatin cream (MYCOSTATIN)   metroNIDAZOLE (FLAGYL) 500 MG tablet   Other Relevant Orders   WET PREP FOR TRICH, YEAST, CLUE     Genitourinary   Bacterial vaginosis    Acute and noted on wet prep + clue cells.  Educated her on this and treatment.  Flagyl 500 MG BID x 7 days sent in.  Recommend she ensure clean hands with partner during sexual intercourse and external manipulation.  Return to office for worsening or ongoing.      Relevant Medications   fluconazole (DIFLUCAN) 150 MG tablet   nystatin (MYCOSTATIN/NYSTOP) powder   nystatin cream (MYCOSTATIN)   metroNIDAZOLE (FLAGYL) 500 MG tablet   Other Relevant Orders   WET PREP FOR TRICH, YEAST, CLUE       Follow up plan: Return if symptoms worsen or fail to improve.

## 2020-09-14 NOTE — Assessment & Plan Note (Addendum)
To umbilicus and external vagina x 3 weeks with no improvement.  Wet prep negative for vaginal yeast + for clue cells.  Will send in Diflucan x 4 tablets, educated her to take one this week and if no improvement may repeat weekly x 4.  Nystatin powder for vaginal area and Nystatin cream for umbilicus sent.  Educated her on use of these.  All questions answered.  Educated her on yeast and to keep area dry.  Return for worsening or ongoing.

## 2020-11-18 ENCOUNTER — Other Ambulatory Visit: Payer: Self-pay | Admitting: Nurse Practitioner

## 2020-11-18 NOTE — Telephone Encounter (Signed)
Pt had apt on 09/14/2020 would pt need apt?

## 2020-11-18 NOTE — Telephone Encounter (Signed)
Requested medication (s) are due for refill today: yes  Requested medication (s) are on the active medication list: yes  Last refill:  06/29/2020  Future visit scheduled:No  Notes to clinic:  due for labs  Review for refill   Requested Prescriptions  Pending Prescriptions Disp Refills   losartan-hydrochlorothiazide (HYZAAR) 100-12.5 MG tablet [Pharmacy Med Name: LOSARTAN-HCTZ 100-12.5 MG TAB] 90 tablet 3    Sig: TAKE 1 TABLET BY MOUTH EVERY DAY      Cardiovascular: ARB + Diuretic Combos Failed - 11/18/2020 10:51 AM      Failed - K in normal range and within 180 days    Potassium  Date Value Ref Range Status  11/07/2019 4.4 3.5 - 5.2 mmol/L Final          Failed - Na in normal range and within 180 days    Sodium  Date Value Ref Range Status  11/07/2019 144 134 - 144 mmol/L Final          Failed - Cr in normal range and within 180 days    Creatinine, Ser  Date Value Ref Range Status  11/07/2019 0.85 0.57 - 1.00 mg/dL Final          Failed - Ca in normal range and within 180 days    Calcium  Date Value Ref Range Status  11/07/2019 9.5 8.7 - 10.3 mg/dL Final          Passed - Patient is not pregnant      Passed - Last BP in normal range    BP Readings from Last 1 Encounters:  09/14/20 115/83          Passed - Valid encounter within last 6 months    Recent Outpatient Visits           2 months ago Skin yeast infection   [place], [place] T, NP   10 months ago Hypothyroidism, unspecified type   [facility], Barbaraann Faster, NP   11 months ago Elevated TSH   [place], [place] T, NP   1 year ago Recurrent major depressive disorder, in full remission ([place])   [place], Barbaraann Faster, NP   1 year ago Encounter for physical examination   [place], Barbaraann Faster, NP       Future Appointments             In 6 months [facility]

## 2021-06-01 ENCOUNTER — Ambulatory Visit (INDEPENDENT_AMBULATORY_CARE_PROVIDER_SITE_OTHER): Payer: Medicare Other | Admitting: *Deleted

## 2021-06-01 DIAGNOSIS — Z0000 Encounter for general adult medical examination without abnormal findings: Secondary | ICD-10-CM | POA: Diagnosis not present

## 2021-06-01 NOTE — Patient Instructions (Signed)
Michelle Espinoza , Thank you for taking time to come for your Medicare Wellness Visit. I appreciate your ongoing commitment to your health goals. Please review the following plan we discussed and let me know if I can assist you in the future.   Screening recommendations/referrals: Colonoscopy: up to date Mammogram: Education provided Bone Density: up to date Recommended yearly ophthalmology/optometry visit for glaucoma screening and checkup Recommended yearly dental visit for hygiene and checkup  Vaccinations: Influenza vaccine: Education provided Pneumococcal vaccine: up to date Tdap vaccine: up to date Shingles vaccine:  1 of 2  Education provided  Advanced directives:  Education provided  Conditions/risks identified:      Preventive Care 7 Years and Older, Female Preventive care refers to lifestyle choices and visits with your health care provider that can promote health and wellness. What does preventive care include? A yearly physical exam. This is also called an annual well check. Dental exams once or twice a year. Routine eye exams. Ask your health care provider how often you should have your eyes checked. Personal lifestyle choices, including: Daily care of your teeth and gums. Regular physical activity. Eating a healthy diet. Avoiding tobacco and drug use. Limiting alcohol use. Practicing safe sex. Taking low-dose aspirin every day. Taking vitamin and mineral supplements as recommended by your health care provider. What happens during an annual well check? The services and screenings done by your health care provider during your annual well check will depend on your age, overall health, lifestyle risk factors, and family history of disease. Counseling  Your health care provider may ask you questions about your: Alcohol use. Tobacco use. Drug use. Emotional well-being. Home and relationship well-being. Sexual activity. Eating habits. History of falls. Memory and  ability to understand (cognition). Work and work Statistician. Reproductive health. Screening  You may have the following tests or measurements: Height, weight, and BMI. Blood pressure. Lipid and cholesterol levels. These may be checked every 5 years, or more frequently if you are over 39 years old. Skin check. Lung cancer screening. You may have this screening every year starting at age 66 if you have a 30-pack-year history of smoking and currently smoke or have quit within the past 15 years. Fecal occult blood test (FOBT) of the stool. You may have this test every year starting at age 90. Flexible sigmoidoscopy or colonoscopy. You may have a sigmoidoscopy every 5 years or a colonoscopy every 10 years starting at age 76. Hepatitis C blood test. Hepatitis B blood test. Sexually transmitted disease (STD) testing. Diabetes screening. This is done by checking your blood sugar (glucose) after you have not eaten for a while (fasting). You may have this done every 1-3 years. Bone density scan. This is done to screen for osteoporosis. You may have this done starting at age 104. Mammogram. This may be done every 1-2 years. Talk to your health care provider about how often you should have regular mammograms. Talk with your health care provider about your test results, treatment options, and if necessary, the need for more tests. Vaccines  Your health care provider may recommend certain vaccines, such as: Influenza vaccine. This is recommended every year. Tetanus, diphtheria, and acellular pertussis (Tdap, Td) vaccine. You may need a Td booster every 10 years. Zoster vaccine. You may need this after age 84. Pneumococcal 13-valent conjugate (PCV13) vaccine. One dose is recommended after age 25. Pneumococcal polysaccharide (PPSV23) vaccine. One dose is recommended after age 44. Talk to your health care provider about which screenings  and vaccines you need and how often you need them. This information is  not intended to replace advice given to you by your health care provider. Make sure you discuss any questions you have with your health care provider. Document Released: 07/23/2015 Document Revised: 03/15/2016 Document Reviewed: 04/27/2015 Elsevier Interactive Patient Education  2017 [place] Prevention in the Home Falls can cause injuries. They can happen to people of all ages. There are many things you can do to make your home safe and to help prevent falls. What can I do on the outside of my home? Regularly fix the edges of walkways and driveways and fix any cracks. Remove anything that might make you trip as you walk through a door, such as a raised step or threshold. Trim any bushes or trees on the path to your home. Use bright outdoor lighting. Clear any walking paths of anything that might make someone trip, such as rocks or tools. Regularly check to see if handrails are loose or broken. Make sure that both sides of any steps have handrails. Any raised decks and porches should have guardrails on the edges. Have any leaves, snow, or ice cleared regularly. Use sand or salt on walking paths during winter. Clean up any spills in your garage right away. This includes oil or grease spills. What can I do in the bathroom? Use night lights. Install grab bars by the toilet and in the tub and shower. Do not use towel bars as grab bars. Use non-skid mats or decals in the tub or shower. If you need to sit down in the shower, use a plastic, non-slip stool. Keep the floor dry. Clean up any water that spills on the floor as soon as it happens. Remove soap buildup in the tub or shower regularly. Attach bath mats securely with double-sided non-slip rug tape. Do not have throw rugs and other things on the floor that can make you trip. What can I do in the bedroom? Use night lights. Make sure that you have a light by your bed that is easy to reach. Do not use any sheets or blankets that  are too big for your bed. They should not hang down onto the floor. Have a firm chair that has side arms. You can use this for support while you get dressed. Do not have throw rugs and other things on the floor that can make you trip. What can I do in the kitchen? Clean up any spills right away. Avoid walking on wet floors. Keep items that you use a lot in easy-to-reach places. If you need to reach something above you, use a strong step stool that has a grab bar. Keep electrical cords out of the way. Do not use floor polish or wax that makes floors slippery. If you must use wax, use non-skid floor wax. Do not have throw rugs and other things on the floor that can make you trip. What can I do with my stairs? Do not leave any items on the stairs. Make sure that there are handrails on both sides of the stairs and use them. Fix handrails that are broken or loose. Make sure that handrails are as long as the stairways. Check any carpeting to make sure that it is firmly attached to the stairs. Fix any carpet that is loose or worn. Avoid having throw rugs at the top or bottom of the stairs. If you do have throw rugs, attach them to the floor with carpet tape.  Make sure that you have a light switch at the top of the stairs and the bottom of the stairs. If you do not have them, ask someone to add them for you. What else can I do to help prevent falls? Wear shoes that: Do not have high heels. Have rubber bottoms. Are comfortable and fit you well. Are closed at the toe. Do not wear sandals. If you use a stepladder: Make sure that it is fully opened. Do not climb a closed stepladder. Make sure that both sides of the stepladder are locked into place. Ask someone to hold it for you, if possible. Clearly mark and make sure that you can see: Any grab bars or handrails. First and last steps. Where the edge of each step is. Use tools that help you move around (mobility aids) if they are needed. These  include: Canes. Walkers. Scooters. Crutches. Turn on the lights when you go into a dark area. Replace any light bulbs as soon as they burn out. Set up your furniture so you have a clear path. Avoid moving your furniture around. If any of your floors are uneven, fix them. If there are any pets around you, be aware of where they are. Review your medicines with your doctor. Some medicines can make you feel dizzy. This can increase your chance of falling. Ask your doctor what other things that you can do to help prevent falls. This information is not intended to replace advice given to you by your health care provider. Make sure you discuss any questions you have with your health care provider. Document Released: 04/22/2009 Document Revised: 12/02/2015 Document Reviewed: 07/31/2014 Elsevier Interactive Patient Education  2017 [employer].

## 2021-06-01 NOTE — Progress Notes (Signed)
Subjective:   Michelle Espinoza is a 70 y.o. female who presents for Medicare Annual (Subsequent) preventive examination. I connected with  Michelle Espinoza on 06/01/21 by a telephone enabled telemedicine application and verified that I am speaking with the correct person using two identifiers.   I discussed the limitations of evaluation and management by telemedicine. The patient expressed understanding and agreed to proceed.  Patient location: home  Provider location: Tele-health   not in office   Review of Systems     Cardiac Risk Factors include: advanced age (>57men, >60 women);hypertension;smoking/ tobacco exposure     Objective:    Today's Vitals   There is no height or weight on file to calculate BMI.  Advanced Directives 06/01/2021 05/31/2020 01/23/2019 07/01/2018 01/18/2018  Does Patient Have a Medical Advance Directive? No No No No No  Would patient like information on creating a medical advance directive? No - Patient declined - - No - Patient declined Yes (MAU/Ambulatory/Procedural Areas - Information given)    Current Medications (verified) Outpatient Encounter Medications as of 06/01/2021  Medication Sig   aspirin EC 81 MG tablet Take 81 mg by mouth daily.   buPROPion (WELLBUTRIN SR) 100 MG 12 hr tablet Take 100 mg by mouth in the morning and at bedtime.   [employer] VITAMIN B12 1000 MCG tablet Take 1,000 mcg by mouth in the morning and at bedtime.   ezetimibe (ZETIA) 10 MG tablet Take 1 tablet (10 mg total) by mouth daily.   FLUoxetine (PROZAC) 40 MG capsule Take 40 mg by mouth daily.    levothyroxine (SYNTHROID) 25 MCG tablet Take 1 tablet (25 mcg total) by mouth daily before breakfast.   losartan-hydrochlorothiazide (HYZAAR) 100-12.5 MG tablet TAKE 1 TABLET BY MOUTH EVERY DAY   Multiple Vitamin (MULTIVITAMIN) tablet Take 1 tablet by mouth daily.   pravastatin (PRAVACHOL) 20 MG tablet Take 1 tablet (20 mg total) by mouth daily.   Fexofenadine HCl (ALLEGRA ALLERGY PO)  Take by mouth. (Patient not taking: Reported on 06/01/2021)   fluconazole (DIFLUCAN) 150 MG tablet Take one tablet (150 MG) by mouth this week and if not improved may repeat one dose a week. (Patient not taking: Reported on 06/01/2021)   gabapentin (NEURONTIN) 300 MG capsule Take 1 capsule (300 mg total) by mouth daily. Start 1 bedtime for 1 week then 2 bedtime for 1 week and if needed 2 bedtime and 1 in morning (Patient not taking: Reported on 09/14/2020)   meloxicam (MOBIC) 15 MG tablet Take 1 tablet (15 mg total) by mouth daily. (Patient not taking: No sig reported)   nystatin (MYCOSTATIN/NYSTOP) powder Apply 1 application topically 3 (three) times daily. (Patient not taking: Reported on 06/01/2021)   nystatin cream (MYCOSTATIN) Apply 1 application topically 2 (two) times daily. (Patient not taking: Reported on 06/01/2021)   No facility-administered encounter medications on file as of 06/01/2021.    Allergies (verified) Lovastatin and Penicillins   History: Past Medical History:  Diagnosis Date   Allergy    Depression    Essential hypertension    Hyperlipidemia    Restless legs    Past Surgical History:  Procedure Laterality Date   ABDOMINAL HYSTERECTOMY     Family History  Adopted: Yes  Problem Relation Age of Onset   Breast cancer Neg Hx    Social History   Socioeconomic History   Marital status: Widowed    Spouse name: Not on file   Number of children: Not on file   Years of  education: 12   Highest education level: High school graduate  Occupational History   Occupation: retired  Tobacco Use   Smoking status: Every Day    Packs/day: 0.50    Years: 49.00    Pack years: 24.50    Types: Cigarettes   Smokeless tobacco: Never  Vaping Use   Vaping Use: Never used  Substance and Sexual Activity   Alcohol use: Yes    Comment: occasionally   Drug use: No   Sexual activity: Not on file  Other Topics Concern   Not on file  Social History Narrative   Not on file    Social Determinants of Health   Financial Resource Strain: Low Risk    Difficulty of Paying Living Expenses: Not hard at all  Food Insecurity: No Food Insecurity   Worried About Charity fundraiser in the Last Year: Never true   [place] in the Last Year: Never true  Transportation Needs: No Transportation Needs   Lack of Transportation (Medical): No   Lack of Transportation (Non-Medical): No  Physical Activity: Inactive   Days of Exercise per Week: 0 days   Minutes of Exercise per Session: 0 min  Stress: No Stress Concern Present   Feeling of Stress : Not at all  Social Connections: Moderately Integrated   Frequency of Communication with Friends and Family: More than three times a week   Frequency of Social Gatherings with Friends and Family: Twice a week   Attends Religious Services: More than 4 times per year   Active Member of [employer] or Organizations: No   Attends Music therapist: Never   Marital Status: Married    Tobacco Counseling Ready to quit: Not Answered Counseling given: Not Answered   Clinical Intake:  Pre-visit preparation completed: Yes  Pain : No/denies pain     Nutritional Risks: None Diabetes: No  How often do you need to have someone help you when you read instructions, pamphlets, or other written materials from your doctor or pharmacy?: 1 - Never  Diabetic?no  Interpreter Needed?: No  Information entered by :: Leroy Kennedy LPN   Activities of Daily Living In your present state of health, do you have any difficulty performing the following activities: 06/01/2021  Hearing? N  Vision? N  Difficulty concentrating or making decisions? N  Walking or climbing stairs? N  Dressing or bathing? N  Doing errands, shopping? N  Preparing Food and eating ? N  Using the Toilet? N  In the past six months, have you accidently leaked urine? N  Do you have problems with loss of bowel control? N  Managing your Medications? N   Housekeeping or managing your Housekeeping? N  Some recent data might be hidden    Patient Care Team: Venita Lick, NP as PCP - General (Nurse Practitioner) Chauncey Mann, MD as Referring Physician (Psychiatry)  Indicate any recent Medical Services you may have received from other than [facility] providers in the past year (date may be approximate).     Assessment:   This is a routine wellness examination for Angeline.  Hearing/Vision screen Hearing Screening - Comments:: No trouble hearing Vision Screening - Comments:: Not up to date Vision  Dietary issues and exercise activities discussed: Current Exercise Habits: The patient does not participate in regular exercise at present   Goals Addressed             This Visit's Progress    Patient Stated  Would like to get more active     Quit Smoking   Not on track    Smoking cessation discussed       Depression Screen PHQ 2/9 Scores 06/01/2021 05/31/2020 11/07/2019 05/09/2019 01/23/2019 07/30/2018 01/18/2018  PHQ - 2 Score 0 0 0 0 0 0 0  PHQ- 9 Score - 0 2 0 - 0 -    Fall Risk Fall Risk  06/01/2021 05/31/2020 05/09/2019 01/23/2019 01/18/2018  Falls in the past year? 0 0 0 0 No  Number falls in past yr: 0 - 0 - -  Injury with Fall? 0 - 0 - -  Risk for fall due to : - Medication side effect - - -  Follow up Falls evaluation completed;Falls prevention discussed Falls evaluation completed;Education provided;Falls prevention discussed - - -    FALL RISK PREVENTION PERTAINING TO THE HOME:  Any stairs in or around the home? No  If so, are there any without handrails? No  Home free of loose throw rugs in walkways, pet beds, electrical cords, etc? Yes  Adequate lighting in your home to reduce risk of falls? Yes   ASSISTIVE DEVICES UTILIZED TO PREVENT FALLS:  Life alert? No  Use of a cane, walker or w/c? No  Grab bars in the bathroom? No  Shower chair or bench in shower? No  Elevated toilet seat or a handicapped  toilet? Yes   TIMED UP AND GO:  Was the test performed? No .    Cognitive Function:  Normal cognitive status assessed by direct observation by this Nurse Health Advisor. No abnormalities found.       6CIT Screen 05/31/2020 01/23/2019 01/18/2018  What Year? 0 points 0 points 0 points  What month? 0 points 0 points 0 points  What time? 0 points 0 points 0 points  Count back from 20 0 points 0 points 0 points  Months in reverse 0 points 0 points 0 points  Repeat phrase 0 points 0 points 0 points  Total Score 0 0 0    Immunizations Immunization History  Administered Date(s) Administered   Fluad Quad(high Dose 65+) 05/09/2019, 06/28/2020   Influenza, High Dose Seasonal PF 06/11/2017   Influenza,inj,Quad PF,6+ Mos 06/17/2015, 05/29/2016, 03/07/2018   Moderna Sars-Covid-2 Vaccination 09/08/2019, 10/07/2019   Pneumococcal Conjugate-13 06/11/2018   Pneumococcal Polysaccharide-23 06/11/2017   Td 12/13/2006, 01/09/2017   Zoster Recombinat (Shingrix) 03/13/2018   Zoster, Live 07/07/2013    TDAP status: Up to date  Flu Vaccine status: Due, Education has been provided regarding the importance of this vaccine. Advised may receive this vaccine at local pharmacy or Health Dept. Aware to provide a copy of the vaccination record if obtained from local pharmacy or Health Dept. Verbalized acceptance and understanding.  Pneumococcal vaccine status: Up to date  Covid-19 vaccine status: Information provided on how to obtain vaccines.   Qualifies for Shingles Vaccine? Yes   Zostavax completed Yes   Shingrix Completed?: No.    Education has been provided regarding the importance of this vaccine. Patient has been advised to call insurance company to determine out of pocket expense if they have not yet received this vaccine. Advised may also receive vaccine at local pharmacy or Health Dept. Verbalized acceptance and understanding.  Screening Tests Health Maintenance  Topic Date Due   Zoster  Vaccines- Shingrix (2 of 2) 05/08/2018   COVID-19 Vaccine (3 - Moderna risk series) 11/04/2019   INFLUENZA VACCINE  02/07/2021   MAMMOGRAM  05/28/2021   Fecal DNA (Cologuard)  04/20/2023   TETANUS/TDAP  01/10/2027   Pneumonia Vaccine 84+ Years old  Completed   DEXA SCAN  Completed   Hepatitis C Screening  Completed   HPV VACCINES  Aged Out    Health Maintenance  Health Maintenance Due  Topic Date Due   Zoster Vaccines- Shingrix (2 of 2) 05/08/2018   COVID-19 Vaccine (3 - Moderna risk series) 11/04/2019   INFLUENZA VACCINE  02/07/2021   MAMMOGRAM  05/28/2021    Colorectal cancer screening: Type of screening: Colonoscopy. Completed 2021. Repeat every 3 years  Mammogram status: Completed  . Repeat every year  Bone Density status: Completed  . Results reflect: Bone density results: NORMAL. Repeat every 0 years.  Lung Cancer Screening: (Low Dose CT Chest recommended if Age 23-80 years, 30 pack-year currently smoking OR have quit w/in 15years.) does not qualify.   Lung Cancer Screening Referral: completed  Additional Screening:  Hepatitis C Screening: does not qualify; Completed 2017  Vision Screening: Recommended annual ophthalmology exams for early detection of glaucoma and other disorders of the eye. Is the patient up to date with their annual eye exam?  No  Who is the provider or what is the name of the office in which the patient attends annual eye exams? Vision If pt is not established with a provider, would they like to be referred to a provider to establish care? No .   Dental Screening: Recommended annual dental exams for proper oral hygiene  Community Resource Referral / Chronic Care Management: CRR required this visit?  No   CCM required this visit?  No      Plan:     I have personally reviewed and noted the following in the patient's chart:   Medical and social history Use of alcohol, tobacco or illicit drugs  Current medications and supplements  including opioid prescriptions.  Functional ability and status Nutritional status Physical activity Advanced directives List of other physicians Hospitalizations, surgeries, and ER visits in previous 12 months Vitals Screenings to include cognitive, depression, and falls Referrals and appointments  In addition, I have reviewed and discussed with patient certain preventive protocols, quality metrics, and best practice recommendations. A written personalized care plan for preventive services as well as general preventive health recommendations were provided to patient.     Leroy Kennedy, LPN   [id number]   Nurse Notes:

## 2021-08-17 DIAGNOSIS — F330 Major depressive disorder, recurrent, mild: Secondary | ICD-10-CM | POA: Diagnosis not present

## 2021-09-21 DIAGNOSIS — F330 Major depressive disorder, recurrent, mild: Secondary | ICD-10-CM | POA: Diagnosis not present

## 2021-10-19 DIAGNOSIS — H524 Presbyopia: Secondary | ICD-10-CM | POA: Diagnosis not present

## 2021-10-31 ENCOUNTER — Other Ambulatory Visit: Payer: Self-pay | Admitting: Nurse Practitioner

## 2021-10-31 DIAGNOSIS — E782 Mixed hyperlipidemia: Secondary | ICD-10-CM

## 2021-11-02 NOTE — Telephone Encounter (Signed)
Patient is overdue for a follow up. Please call to schdule and then route to provider for refill.  ?

## 2021-11-02 NOTE — Telephone Encounter (Signed)
Requested medication (s) are due for refill today: yes ? ?Requested medication (s) are on the active medication list: yes ? ?Last refill:  06/25/20 ? ?Future visit scheduled: no ? ?Notes to clinic:  Unable to refill per protocol due to failed labs, no updated results. Unable to refill per protocol, appointment needed.  ? ? ?  ?Requested Prescriptions  ?Pending Prescriptions Disp Refills  ? ezetimibe (ZETIA) 10 MG tablet [Pharmacy Med Name: EZETIMIBE 10 MG TABLET] 90 tablet 4  ?  Sig: TAKE 1 TABLET BY MOUTH EVERY DAY  ?  ? Cardiovascular:  Antilipid - Sterol Transport Inhibitors Failed - 10/31/2021  4:40 PM  ?  ?  Failed - AST in normal range and within 360 days  ?  AST  ?Date Value Ref Range Status  ?05/09/2019 20 0 - 40 IU/L Final  ? ?AST (SGOT) Piccolo, Waived  ?Date Value Ref Range Status  ?07/30/2018 28 11 - 38 U/L Final  ?  ?  ?  ?  Failed - ALT in normal range and within 360 days  ?  ALT  ?Date Value Ref Range Status  ?05/09/2019 12 0 - 32 IU/L Final  ? ?ALT (SGPT) Piccolo, Waived  ?Date Value Ref Range Status  ?07/30/2018 25 10 - 47 U/L Final  ?  ?  ?  ?  Failed - Lipid Panel in normal range within the last 12 months  ?  Cholesterol, Total  ?Date Value Ref Range Status  ?11/07/2019 208 (H) 100 - 199 mg/dL Final  ? ?Cholesterol Piccolo, [place]  ?Date Value Ref Range Status  ?07/30/2018 206 (H) <200 mg/dL Final  ?  Comment:  ?                          Desirable                <200 ?                        Borderline High      200- 239 ?                        High                     >239 ?  ? ?LDL Chol Calc (NIH)  ?Date Value Ref Range Status  ?11/07/2019 104 (H) 0 - 99 mg/dL Final  ? ?HDL  ?Date Value Ref Range Status  ?11/07/2019 42 >39 mg/dL Final  ? ?Triglycerides  ?Date Value Ref Range Status  ?11/07/2019 365 (H) 0 - 149 mg/dL Final  ? ?Triglycerides Piccolo,Waived  ?Date Value Ref Range Status  ?07/30/2018 100 <150 mg/dL Final  ?  Comment:  ?                          Normal                   <150 ?                         Borderline High     150 - 199 ?                        High  200 - 499 ?                        Very High                >499 ?  ? ?  ?  ?  Passed - Patient is not pregnant  ?  ?  Passed - Valid encounter within last 12 months  ?  Recent Outpatient Visits   ? ?      ? 1 year ago Skin yeast infection  ? [place], Henrine Screws T, NP  ? 1 year ago Hypothyroidism, unspecified type  ? [place], Henrine Screws T, NP  ? 1 year ago Elevated TSH  ? [place], [place] T, NP  ? 1 year ago Recurrent major depressive disorder, in full remission ([place])  ? [place], Henrine Screws T, NP  ? 2 years ago Encounter for physical examination  ? [place], [place] T, NP  ? ?  ?  ? ? ?  ?  ?  ? pravastatin (PRAVACHOL) 20 MG tablet [Pharmacy Med Name: PRAVASTATIN SODIUM 20 MG TAB] 90 tablet 4  ?  Sig: TAKE 1 TABLET BY MOUTH EVERY DAY  ?  ? Cardiovascular:  Antilipid - Statins Failed - 10/31/2021  4:40 PM  ?  ?  Failed - Lipid Panel in normal range within the last 12 months  ?  Cholesterol, Total  ?Date Value Ref Range Status  ?11/07/2019 208 (H) 100 - 199 mg/dL Final  ? ?Cholesterol Piccolo, [place]  ?Date Value Ref Range Status  ?07/30/2018 206 (H) <200 mg/dL Final  ?  Comment:  ?                          Desirable                <200 ?                        Borderline High      200- 239 ?                        High                     >239 ?  ? ?LDL Chol Calc (NIH)  ?Date Value Ref Range Status  ?11/07/2019 104 (H) 0 - 99 mg/dL Final  ? ?HDL  ?Date Value Ref Range Status  ?11/07/2019 42 >39 mg/dL Final  ? ?Triglycerides  ?Date Value Ref Range Status  ?11/07/2019 365 (H) 0 - 149 mg/dL Final  ? ?Triglycerides Piccolo,Waived  ?Date Value Ref Range Status  ?07/30/2018 100 <150 mg/dL Final  ?  Comment:  ?                          Normal                   <150 ?                        Borderline High     150 - 199 ?                         High  200 - 499 ?                        Very High                >499 ?  ? ?  ?  ?  Passed - Patient is not pregnant  ?  ?  Passed - Valid encounter within last 12 months  ?  Recent Outpatient Visits   ? ?      ? 1 year ago Skin yeast infection  ? [place], Henrine Screws T, NP  ? 1 year ago Hypothyroidism, unspecified type  ? [place], Henrine Screws T, NP  ? 1 year ago Elevated TSH  ? [place], [place] T, NP  ? 1 year ago Recurrent major depressive disorder, in full remission ([place])  ? [place], Henrine Screws T, NP  ? 2 years ago Encounter for physical examination  ? [facility] [place], Henrine Screws T, NP  ? ?  ?  ? ? ?  ?  ?  ? ? ?

## 2021-11-05 NOTE — Patient Instructions (Addendum)
Please call to schedule your mammogram and/or bone density: ?[facility] at [facility]  ?Address: [address], [place], [place] [address] ?Phone: [phone]  ? ?DASH Eating Plan ?DASH stands for Dietary Approaches to Stop Hypertension. The DASH eating plan is a healthy eating plan that has been shown to: ?Reduce high blood pressure (hypertension). ?Reduce your risk for type 2 diabetes, heart disease, and stroke. ?Help with weight loss. ?What are tips for following this plan? ?Reading food labels ?Check food labels for the amount of salt (sodium) per serving. Choose foods with less than 5 percent of the Daily Value of sodium. Generally, foods with less than 300 milligrams (mg) of sodium per serving fit into this eating plan. ?To find whole grains, look for the word "whole" as the first word in the ingredient list. ?Shopping ?Buy products labeled as "low-sodium" or "no salt added." ?Buy fresh foods. Avoid canned foods and pre-made or frozen meals. ?Cooking ?Avoid adding salt when cooking. Use salt-free seasonings or herbs instead of table salt or sea salt. Check with your health care provider or pharmacist before using salt substitutes. ?Do not fry foods. Cook foods using healthy methods such as baking, boiling, grilling, roasting, and broiling instead. ?Cook with heart-healthy oils, such as olive, canola, avocado, soybean, or sunflower oil. ?Meal planning ? ?Eat a balanced diet that includes: ?4 or more servings of fruits and 4 or more servings of vegetables each day. Try to fill one-half of your plate with fruits and vegetables. ?6-8 servings of whole grains each day. ?Less than 6 oz (170 g) of lean meat, poultry, or fish each day. A 3-oz (85-g) serving of meat is about the same size as a deck of cards. One egg equals 1 oz (28 g). ?2-3 servings of low-fat dairy each day. One serving is 1 cup (237 mL). ?1 serving of nuts, seeds, or beans 5 times each week. ?2-3 servings of  heart-healthy fats. Healthy fats called omega-3 fatty acids are found in foods such as walnuts, flaxseeds, fortified milks, and eggs. These fats are also found in cold-water fish, such as sardines, salmon, and mackerel. ?Limit how much you eat of: ?Canned or prepackaged foods. ?Food that is high in trans fat, such as some fried foods. ?Food that is high in saturated fat, such as fatty meat. ?Desserts and other sweets, sugary drinks, and other foods with added sugar. ?Full-fat dairy products. ?Do not salt foods before eating. ?Do not eat more than 4 egg yolks a week. ?Try to eat at least 2 vegetarian meals a week. ?Eat more home-cooked food and less restaurant, buffet, and fast food. ?Lifestyle ?When eating at a restaurant, ask that your food be prepared with less salt or no salt, if possible. ?If you drink alcohol: ?Limit how much you use to: ?0-1 drink a day for women who are not pregnant. ?0-2 drinks a day for men. ?Be aware of how much alcohol is in your drink. In the [place] one drink equals one 12 oz bottle of beer (355 mL), one 5 oz glass of wine (148 mL), or one 1? oz glass of hard liquor (44 mL). ?General information ?Avoid eating more than 2,300 mg of salt a day. If you have hypertension, you may need to reduce your sodium intake to 1,500 mg a day. ?Work with your health care provider to maintain a healthy body weight or to lose weight. Ask what an ideal weight is for you. ?Get at least 30 minutes of  exercise that causes your heart to beat faster (aerobic exercise) most days of the week. Activities may include walking, swimming, or biking. ?Work with your health care provider or dietitian to adjust your eating plan to your individual calorie needs. ?What foods should I eat? ?Fruits ?All fresh, dried, or frozen fruit. Canned fruit in natural juice (without added sugar). ?Vegetables ?Fresh or frozen vegetables (raw, steamed, roasted, or grilled). Low-sodium or reduced-sodium tomato and vegetable juice.  Low-sodium or reduced-sodium tomato sauce and tomato paste. Low-sodium or reduced-sodium canned vegetables. ?Grains ?Whole-grain or whole-wheat bread. Whole-grain or whole-wheat pasta. Brown rice. [place] Morrow. Bulgur. Whole-grain and low-sodium cereals. Pita bread. Low-fat, low-sodium crackers. Whole-wheat flour tortillas. ?Meats and other proteins ?Skinless chicken or [place]. Ground chicken or [place]. Pork with fat trimmed off. Fish and seafood. Egg whites. Dried beans, peas, or lentils. Unsalted nuts, nut butters, and seeds. Unsalted canned beans. Lean cuts of beef with fat trimmed off. Low-sodium, lean precooked or cured meat, such as sausages or meat loaves. ?Dairy ?Low-fat (1%) or fat-free (skim) milk. Reduced-fat, low-fat, or fat-free cheeses. Nonfat, low-sodium ricotta or cottage cheese. Low-fat or nonfat yogurt. Low-fat, low-sodium cheese. ?Fats and oils ?Soft margarine without trans fats. Vegetable oil. Reduced-fat, low-fat, or light mayonnaise and salad dressings (reduced-sodium). Canola, safflower, olive, avocado, soybean, and sunflower oils. Avocado. ?Seasonings and condiments ?Herbs. Spices. Seasoning mixes without salt. ?Other foods ?Unsalted popcorn and pretzels. Fat-free sweets. ?The items listed above may not be a complete list of foods and beverages you can eat. Contact a dietitian for more information. ?What foods should I avoid? ?Fruits ?Canned fruit in a light or heavy syrup. Fried fruit. Fruit in cream or butter sauce. ?Vegetables ?Creamed or fried vegetables. Vegetables in a cheese sauce. Regular canned vegetables (not low-sodium or reduced-sodium). Regular canned tomato sauce and paste (not low-sodium or reduced-sodium). Regular tomato and vegetable juice (not low-sodium or reduced-sodium). Angie Fava. Olives. ?Grains ?Baked goods made with fat, such as croissants, muffins, or some breads. Dry pasta or rice meal packs. ?Meats and other proteins ?Fatty cuts of meat. Ribs. Fried meat. Berniece Salines.  Bologna, salami, and other precooked or cured meats, such as sausages or meat loaves. Fat from the back of a pig (fatback). Bratwurst. Salted nuts and seeds. Canned beans with added salt. Canned or smoked fish. Whole eggs or egg yolks. Chicken or [place] with skin. ?Dairy ?Whole or 2% milk, cream, and half-and-half. Whole or full-fat cream cheese. Whole-fat or sweetened yogurt. Full-fat cheese. Nondairy creamers. Whipped toppings. Processed cheese and cheese spreads. ?Fats and oils ?Butter. Stick margarine. Lard. Shortening. Ghee. Bacon fat. Tropical oils, such as coconut, palm kernel, or palm oil. ?Seasonings and condiments ?Onion salt, garlic salt, seasoned salt, table salt, and sea salt. Worcestershire sauce. Tartar sauce. Barbecue sauce. Teriyaki sauce. Soy sauce, including reduced-sodium. Steak sauce. Canned and packaged gravies. Fish sauce. Oyster sauce. Cocktail sauce. Store-bought horseradish. Ketchup. Mustard. Meat flavorings and tenderizers. Bouillon cubes. Hot sauces. Pre-made or packaged marinades. Pre-made or packaged taco seasonings. Relishes. Regular salad dressings. ?Other foods ?Salted popcorn and pretzels. ?The items listed above may not be a complete list of foods and beverages you should avoid. Contact a dietitian for more information. ?Where to find more information ?National Heart, Lung, and Blood Institute: https://wilson-eaton.com/ ?American Heart Association: www.heart.org ?Academy of Nutrition and Dietetics: www.eatright.org ?[place]: www.kidney.org ?Summary ?The DASH eating plan is a healthy eating plan that has been shown to reduce high blood pressure (hypertension). It may also reduce your risk for type 2  diabetes, heart disease, and stroke. ?When on the DASH eating plan, aim to eat more fresh fruits and vegetables, whole grains, lean proteins, low-fat dairy, and heart-healthy fats. ?With the DASH eating plan, you should limit salt (sodium) intake to 2,300 mg a day. If you have  hypertension, you may need to reduce your sodium intake to 1,500 mg a day. ?Work with your health care provider or dietitian to adjust your eating plan to your individual calorie needs. ?This information is not intend

## 2021-11-07 ENCOUNTER — Encounter: Payer: Self-pay | Admitting: Nurse Practitioner

## 2021-11-07 ENCOUNTER — Ambulatory Visit (INDEPENDENT_AMBULATORY_CARE_PROVIDER_SITE_OTHER): Payer: Medicare Other | Admitting: Nurse Practitioner

## 2021-11-07 ENCOUNTER — Other Ambulatory Visit: Payer: Self-pay | Admitting: *Deleted

## 2021-11-07 VITALS — BP 130/83 | HR 87 | Temp 97.7°F | Wt 173.2 lb

## 2021-11-07 DIAGNOSIS — F3342 Major depressive disorder, recurrent, in full remission: Secondary | ICD-10-CM | POA: Diagnosis not present

## 2021-11-07 DIAGNOSIS — Z122 Encounter for screening for malignant neoplasm of respiratory organs: Secondary | ICD-10-CM

## 2021-11-07 DIAGNOSIS — G2581 Restless legs syndrome: Secondary | ICD-10-CM

## 2021-11-07 DIAGNOSIS — Z6830 Body mass index (BMI) 30.0-30.9, adult: Secondary | ICD-10-CM

## 2021-11-07 DIAGNOSIS — J432 Centrilobular emphysema: Secondary | ICD-10-CM | POA: Diagnosis not present

## 2021-11-07 DIAGNOSIS — I2510 Atherosclerotic heart disease of native coronary artery without angina pectoris: Secondary | ICD-10-CM | POA: Diagnosis not present

## 2021-11-07 DIAGNOSIS — F17210 Nicotine dependence, cigarettes, uncomplicated: Secondary | ICD-10-CM

## 2021-11-07 DIAGNOSIS — I10 Essential (primary) hypertension: Secondary | ICD-10-CM | POA: Diagnosis not present

## 2021-11-07 DIAGNOSIS — I700 Atherosclerosis of aorta: Secondary | ICD-10-CM | POA: Diagnosis not present

## 2021-11-07 DIAGNOSIS — E782 Mixed hyperlipidemia: Secondary | ICD-10-CM

## 2021-11-07 DIAGNOSIS — E6609 Other obesity due to excess calories: Secondary | ICD-10-CM

## 2021-11-07 DIAGNOSIS — E039 Hypothyroidism, unspecified: Secondary | ICD-10-CM

## 2021-11-07 DIAGNOSIS — Z87891 Personal history of nicotine dependence: Secondary | ICD-10-CM

## 2021-11-07 DIAGNOSIS — Z1231 Encounter for screening mammogram for malignant neoplasm of breast: Secondary | ICD-10-CM

## 2021-11-07 DIAGNOSIS — E559 Vitamin D deficiency, unspecified: Secondary | ICD-10-CM

## 2021-11-07 DIAGNOSIS — I2583 Coronary atherosclerosis due to lipid rich plaque: Secondary | ICD-10-CM | POA: Diagnosis not present

## 2021-11-07 LAB — MICROALBUMIN, URINE WAIVED
Creatinine, Urine Waived: 100 mg/dL (ref 10–300)
Microalb, Ur Waived: 10 mg/L (ref 0–19)
Microalb/Creat Ratio: <30 mg/g (ref <30)

## 2021-11-07 MED ORDER — EZETIMIBE 10 MG PO TABS
10.0000 mg | ORAL_TABLET | Freq: Every day | ORAL | 4 refills | Status: DC
Start: 2021-11-07 — End: 2022-11-08

## 2021-11-07 MED ORDER — LOSARTAN POTASSIUM-HCTZ 100-12.5 MG PO TABS: 1.0000 | ORAL_TABLET | Freq: Every day | ORAL | 4 refills | Status: DC

## 2021-11-07 MED ORDER — LEVOTHYROXINE SODIUM 25 MCG PO TABS: 25.0000 ug | ORAL_TABLET | Freq: Every day | ORAL | 4 refills | Status: DC

## 2021-11-07 MED ORDER — PRAVASTATIN SODIUM 20 MG PO TABS: 20.0000 mg | ORAL_TABLET | Freq: Every day | ORAL | 4 refills | Status: DC

## 2021-11-07 NOTE — Assessment & Plan Note (Signed)
Ongoing has been out of medication.  Restart Levothyroxine 25 MCG daily.  Continue current medication regimen and adjust as needed.  TSH, antibody, and Free T4 today. ?

## 2021-11-07 NOTE — Assessment & Plan Note (Signed)
Noted on CT lung screening in 2020 -- at this time recommend complete cessation of smoking and continue statin therapy for prevention. ?

## 2021-11-07 NOTE — Assessment & Plan Note (Signed)
Chronic, stable.  Denies SI/HI.  Continue current medication regimen as prescribed by psychiatry and collaboration with psychiatry. ?

## 2021-11-07 NOTE — Assessment & Plan Note (Signed)
Chronic, stable.  No current medications, Gabapentin offered no help.  Recommend she trial Magnesium nightly.  Check CBC, B12 today. ?

## 2021-11-07 NOTE — Assessment & Plan Note (Signed)
Chronic, stable.  Continue current medication regimen and adjust as needed.  Lipid panel today. 

## 2021-11-07 NOTE — Progress Notes (Signed)
? ?BP 130/83   Pulse 87   Temp 97.7 ?F (36.5 ?C) (Oral)   Wt 173 lb 3.2 oz (78.6 kg)   SpO2 94%   BMI 30.68 kg/m?   ? ?Subjective:  ? ? Patient ID: Michelle Espinoza, female    DOB: Mar 16, 1951, 71 y.o.   MRN: [id number] ? ?HPI: ?Michelle Espinoza is a 71 y.o. female ? ?Chief Complaint  ?Patient presents with  ? Hypothyroidism  ? Hyperlipidemia  ? Hypertension  ? Depression  ? Medication Refill  ?  Patient states she is here requesting refills on her medications.   ? ?HYPERTENSION / HYPERLIPIDEMIA ?Currently taking Losartan-HCTZ 100-12.5 daily and Zetia + Pravastatin -- has been out of these for one week. ? ?Is a smoker, smokes 1/2 PPD.  Has smoked since she was 17-18.  Last lung screening in 2020 noted centrilobular emphysema and aortic atherosclerosis + cholelithiasis.  No current inhalers.   ?Satisfied with current treatment? yes ?Duration of hypertension: chronic ?BP monitoring frequency: not checking ?BP range:  ?BP medication side effects: no ?Duration of hyperlipidemia: chronic ?Cholesterol medication side effects: no ?Cholesterol supplements: none ?Medication compliance: fair compliance ?Aspirin: yes ?Recent stressors: no ?Recurrent headaches: no ?Visual changes: no ?Palpitations: no ?Dyspnea: no ?Chest pain: no ?Lower extremity edema: no ?Dizzy/lightheaded: no  ? ?HYPOTHYROIDISM ?Continues on Levothyroxine 25 MCG daily -- has been out of this for some time. ?Thyroid control status:uncontrolled ?Satisfied with current treatment? yes ?Medication side effects: no ?Medication compliance: poor compliance ?Etiology of hypothyroidism:  ?Recent dose adjustment:no ?Fatigue: yes ?Cold intolerance: no ?Heat intolerance: no ?Weight gain: no ?Weight loss: no ?Constipation: no ?Diarrhea/loose stools: no ?Palpitations: no ?Lower extremity edema: no ?Anxiety/depressed mood: no  ? ?DEPRESSION ?Currently taking Wellbutrin and Prozac daily.  Followed by Dr. Nicolasa Ducking, last saw 09/21/21 -- they reduced Prozac to 20 MG. ? ?Continues  to notice restless leg -- no current medications for this.  Her son did notice [place] the [place] her legs move a lot.  Gabapentin prescribed in past, but offered no benefit.  Has Magnesium at home, but has not tried. ?Mood status: stable ?Satisfied with current treatment?: yes ?Symptom severity: moderate  ?Duration of current treatment : chronic ?Side effects: no ?Medication compliance: good compliance ?Psychotherapy/counseling: yes current ?Depressed mood: no ?Anxious mood: no ?Anhedonia: no ?Significant weight loss or gain: no ?Insomnia: none ?Fatigue:  occasional ?Feelings of worthlessness or guilt: no ?Impaired concentration/indecisiveness: no ?Suicidal ideations: no ?Hopelessness: no ?Crying spells: no ? ?  11/07/2021  ?  1:16 PM 06/01/2021  ? 12:18 PM 05/31/2020  ?  1:03 PM 11/07/2019  ?  2:22 PM 05/09/2019  ?  2:47 PM  ?Depression screen PHQ 2/9  ?Decreased Interest 0 0 0 0 0  ?Down, Depressed, Hopeless 0 0 0 0 0  ?PHQ - 2 Score 0 0 0 0 0  ?Altered sleeping 0  0 1 0  ?Tired, decreased energy 0  0 1 0  ?Change in appetite 0  0 0 0  ?Feeling bad or failure about yourself  0  0 0 0  ?Trouble concentrating 0  0 0 0  ?Moving slowly or fidgety/restless 0  0 0 0  ?Suicidal thoughts 0  0 0 0  ?PHQ-9 Score 0  0 2 0  ?Difficult doing work/chores Not difficult at all  Not difficult at all Not difficult at all   ?  ? ?  05/09/2019  ?  2:46 PM  ?GAD 7 : Generalized  Anxiety Score  ?Nervous, Anxious, on Edge 0  ?Control/stop worrying 0  ?Worry too much - different things 0  ?Trouble relaxing 0  ?Restless 0  ?Easily annoyed or irritable 0  ?Afraid - awful might happen 0  ?Total GAD 7 Score 0  ? ? ?Relevant past medical, surgical, family and social history reviewed and updated as indicated. Interim medical history since our last visit reviewed. ?Allergies and medications reviewed and updated. ? ?Review of Systems  ?Constitutional:  Negative for activity change, appetite change, diaphoresis, fatigue and fever.   ?Respiratory:  Negative for cough, chest tightness and shortness of breath.   ?Cardiovascular:  Negative for chest pain, palpitations and leg swelling.  ?Gastrointestinal: Negative.   ?Endocrine: Negative for cold intolerance and heat intolerance.  ?Neurological: Negative.   ?Psychiatric/Behavioral: Negative.    ? ?Per HPI unless specifically indicated above ? ?   ?Objective:  ?  ?BP 130/83   Pulse 87   Temp 97.7 ?F (36.5 ?C) (Oral)   Wt 173 lb 3.2 oz (78.6 kg)   SpO2 94%   BMI 30.68 kg/m?   ?Wt Readings from Last 3 Encounters:  ?11/07/21 173 lb 3.2 oz (78.6 kg)  ?09/14/20 166 lb 6.4 oz (75.5 kg)  ?05/31/20 172 lb (78 kg)  ?  ?Physical Exam ?Vitals and nursing note reviewed.  ?Constitutional:   ?   General: She is awake. She is not in acute distress. ?   Appearance: She is well-developed. She is obese. She is not ill-appearing.  ?HENT:  ?   Head: Normocephalic.  ?   Right Ear: Hearing normal.  ?   Left Ear: Hearing normal.  ?Eyes:  ?   General: Lids are normal.     ?   Right eye: No discharge.     ?   Left eye: No discharge.  ?   Conjunctiva/sclera: Conjunctivae normal.  ?   Pupils: Pupils are equal, round, and reactive to light.  ?Neck:  ?   Thyroid: No thyromegaly.  ?   Vascular: No carotid bruit.  ?Cardiovascular:  ?   Rate and Rhythm: Normal rate and regular rhythm.  ?   Heart sounds: Normal heart sounds. No murmur heard. ?  No gallop.  ?Pulmonary:  ?   Effort: Pulmonary effort is normal. No accessory muscle usage or respiratory distress.  ?   Breath sounds: Normal breath sounds.  ?Abdominal:  ?   General: Bowel sounds are normal.  ?   Palpations: Abdomen is soft.  ?Musculoskeletal:  ?   Cervical back: Normal range of motion and neck supple.  ?   Right lower leg: No edema.  ?   Left lower leg: No edema.  ?Skin: ?   General: Skin is warm and dry.  ?Neurological:  ?   Mental Status: She is alert and oriented to person, place, and time.  ?Psychiatric:     ?   Attention and Perception: Attention normal.     ?    Mood and Affect: Mood normal.     ?   Speech: Speech normal.     ?   Behavior: Behavior normal. Behavior is cooperative.     ?   Thought Content: Thought content normal.  ? ? ?Results for orders placed or performed in visit on 09/14/20  ?WET PREP FOR [place], YEAST, CLUE  ? Specimen: Sterile Swab  ? Sterile Swab  ?Result Value Ref Range  ? Trichomonas Exam Negative Negative  ? Yeast Exam Negative Negative  ?  Clue Cell Exam Positive (A) Negative  ? ?   ?Assessment & Plan:  ? ?Problem List Items Addressed This Visit   ? ?  ? Cardiovascular and Mediastinum  ? Aortic atherosclerosis ([place])  ?  Noted on CT lung screening in 2020 -- at this time recommend complete cessation of smoking and continue statin therapy for prevention. ? ?  ?  ? Relevant Medications  ? ezetimibe (ZETIA) 10 MG tablet  ? losartan-hydrochlorothiazide (HYZAAR) 100-12.5 MG tablet  ? pravastatin (PRAVACHOL) 20 MG tablet  ? Essential hypertension  ?  Chronic, stable with BP at goal.  Continue current medication regimen and adjust as needed -- refills sent on all.  Recommend monitoring BP at home at least a few times and week and documenting.  LABS: CBC, CMP, TSH, urine ALB today.  Return in 6 months. ? ? ?  ?  ? Relevant Medications  ? ezetimibe (ZETIA) 10 MG tablet  ? losartan-hydrochlorothiazide (HYZAAR) 100-12.5 MG tablet  ? pravastatin (PRAVACHOL) 20 MG tablet  ? Other Relevant Orders  ? Microalbumin, Urine Waived  ? CBC with Differential/Platelet  ? Comprehensive metabolic panel  ?  ? Respiratory  ? Centrilobular emphysema ([place])  ?  Ongoing in long time smoker and noted on CT lung screening.  Recommend complete cessation of smoking.  No current inhalers and stable.  CBC today.  Plan on spirometry next visit. ? ?  ?  ? Relevant Medications  ? fexofenadine (ALLEGRA) 180 MG tablet  ?  ? Endocrine  ? Hypothyroid  ?  Ongoing has been out of medication.  Restart Levothyroxine 25 MCG daily.  Continue current medication regimen and adjust as needed.  TSH,  antibody, and Free T4 today. ? ?  ?  ? Relevant Medications  ? levothyroxine (SYNTHROID) 25 MCG tablet  ? Other Relevant Orders  ? TSH  ? Thyroid peroxidase antibody  ? T4, free  ?  ? Other  ? Hyperlipidemia  ?  Ch

## 2021-11-07 NOTE — Assessment & Plan Note (Signed)
Ongoing in long time smoker and noted on CT lung screening.  Recommend complete cessation of smoking.  No current inhalers and stable.  CBC today.  Plan on spirometry next visit. ?

## 2021-11-07 NOTE — Assessment & Plan Note (Signed)
BMI 30.68.  Recommended eating smaller high protein, low fat meals more frequently and exercising 30 mins a day 5 times a week with a goal of 10-15lb weight loss in the next 3 months. Patient voiced their understanding and motivation to adhere to these recommendations. ? ?

## 2021-11-07 NOTE — Assessment & Plan Note (Signed)
Chronic, stable with BP at goal.  Continue current medication regimen and adjust as needed -- refills sent on all.  Recommend monitoring BP at home at least a few times and week and documenting.  LABS: CBC, CMP, TSH, urine ALB today.  Return in 6 months. ? ?

## 2021-11-08 ENCOUNTER — Encounter: Payer: Self-pay | Admitting: Nurse Practitioner

## 2021-11-08 DIAGNOSIS — E559 Vitamin D deficiency, unspecified: Secondary | ICD-10-CM | POA: Insufficient documentation

## 2021-11-08 LAB — CBC WITH DIFFERENTIAL/PLATELET
Basophils Absolute: 0.1 10*3/uL (ref 0.0–0.2)
Basos: 1 %
EOS (ABSOLUTE): 0.4 10*3/uL (ref 0.0–0.4)
Eos: 5 %
Hematocrit: 38.2 % (ref 34.0–46.6)
Hemoglobin: 13.3 g/dL (ref 11.1–15.9)
Immature Grans (Abs): 0 10*3/uL (ref 0.0–0.1)
Immature Granulocytes: 0 %
Lymphocytes Absolute: 2.1 10*3/uL (ref 0.7–3.1)
Lymphs: 30 %
MCH: 31.4 pg (ref 26.6–33.0)
MCHC: 34.8 g/dL (ref 31.5–35.7)
MCV: 90 fL (ref 79–97)
Monocytes Absolute: 0.5 10*3/uL (ref 0.1–0.9)
Monocytes: 8 %
Neutrophils Absolute: 4 10*3/uL (ref 1.4–7.0)
Neutrophils: 56 %
Platelets: 346 10*3/uL (ref 150–450)
RBC: 4.24 x10E6/uL (ref 3.77–5.28)
RDW: 12.8 % (ref 11.7–15.4)
WBC: 7.2 10*3/uL (ref 3.4–10.8)

## 2021-11-08 LAB — LIPID PANEL W/O CHOL/HDL RATIO
Cholesterol, Total: 256 mg/dL — ABNORMAL HIGH (ref 100–199)
HDL: 44 mg/dL (ref >39)
LDL Chol Calc (NIH): 176 mg/dL — ABNORMAL HIGH (ref 0–99)
Triglycerides: 194 mg/dL — ABNORMAL HIGH (ref 0–149)
VLDL Cholesterol Cal: 36 mg/dL (ref 5–40)

## 2021-11-08 LAB — COMPREHENSIVE METABOLIC PANEL
ALT: 17 IU/L (ref 0–32)
AST: 19 IU/L (ref 0–40)
Albumin/Globulin Ratio: 1.9 (ref 1.2–2.2)
Albumin: 4.4 g/dL (ref 3.8–4.8)
Alkaline Phosphatase: 84 IU/L (ref 44–121)
BUN/Creatinine Ratio: 7 — ABNORMAL LOW (ref 12–28)
BUN: 6 mg/dL — ABNORMAL LOW (ref 8–27)
Bilirubin Total: 0.3 mg/dL (ref 0.0–1.2)
CO2: 23 mmol/L (ref 20–29)
Calcium: 9.3 mg/dL (ref 8.7–10.3)
Chloride: 105 mmol/L (ref 96–106)
Creatinine, Ser: 0.85 mg/dL (ref 0.57–1.00)
Globulin, Total: 2.3 g/dL (ref 1.5–4.5)
Glucose: 109 mg/dL — ABNORMAL HIGH (ref 70–99)
Potassium: 3.9 mmol/L (ref 3.5–5.2)
Sodium: 144 mmol/L (ref 134–144)
Total Protein: 6.7 g/dL (ref 6.0–8.5)
eGFR: 74 mL/min/{1.73_m2} (ref >59)

## 2021-11-08 LAB — VITAMIN B12: Vitamin B-12: 1429 pg/mL — ABNORMAL HIGH (ref 232–1245)

## 2021-11-08 LAB — VITAMIN D 25 HYDROXY (VIT D DEFICIENCY, FRACTURES): Vit D, 25-Hydroxy: 15.7 ng/mL — ABNORMAL LOW (ref 30.0–100.0)

## 2021-11-08 LAB — TSH: TSH: 3.95 u[IU]/mL (ref 0.450–4.500)

## 2021-11-08 LAB — THYROID PEROXIDASE ANTIBODY: Thyroperoxidase Ab SerPl-aCnc: 62 IU/mL — ABNORMAL HIGH (ref 0–34)

## 2021-11-08 LAB — T4, FREE: Free T4: 1.12 ng/dL (ref 0.82–1.77)

## 2021-11-08 NOTE — Progress Notes (Signed)
Contacted via [place] ? ? ?Good evening Chani, your labs have returned: ?- Kidney function, creatinine and eGFR, remains normal, as is liver function, AST and ALT.   ?- Vitamin D level is on low side, please start taking Vitamin D3 2000 units a day for overall bone health.  B12 level is a little above goal, if taking B12 daily change to every other day. ?- Thyroid labs show normal TSH and Free T4, can continue current Levothyroxine dosing.  Thyroid antibody is a little elevated, this shows cause of hypothyroid is Hashimoto's thyroiditis, this is where body does not recognize thyroid and attacks it causing inflammation -- this is cause of your thyroid disease. ?- cholesterol levels are a little above goal, please ensure you are taking Pravastatin daily and we will recheck next visit.  Any questions? ?Keep being stellar!!  Thank you for allowing me to participate in your care.  I appreciate you. ?Kindest regards, ?Draylon Mercadel ?

## 2021-11-18 ENCOUNTER — Ambulatory Visit
Admission: RE | Admit: 2021-11-18 | Discharge: 2021-11-18 | Disposition: A | Discharge status: Home / Self Care | Payer: Medicare Other | Source: Ambulatory Visit | Attending: Acute Care | Admitting: Acute Care

## 2021-11-18 DIAGNOSIS — Z87891 Personal history of nicotine dependence: Secondary | ICD-10-CM | POA: Diagnosis not present

## 2021-11-18 DIAGNOSIS — Z122 Encounter for screening for malignant neoplasm of respiratory organs: Secondary | ICD-10-CM | POA: Diagnosis not present

## 2021-11-18 DIAGNOSIS — F17210 Nicotine dependence, cigarettes, uncomplicated: Secondary | ICD-10-CM | POA: Diagnosis not present

## 2021-11-21 ENCOUNTER — Ambulatory Visit: Payer: Self-pay | Admitting: *Deleted

## 2021-11-21 ENCOUNTER — Ambulatory Visit (INDEPENDENT_AMBULATORY_CARE_PROVIDER_SITE_OTHER): Payer: Medicare Other | Admitting: Nurse Practitioner

## 2021-11-21 ENCOUNTER — Ambulatory Visit
Admission: RE | Admit: 2021-11-21 | Discharge: 2021-11-21 | Disposition: A | Discharge status: Home / Self Care | Payer: Medicare Other | Source: Ambulatory Visit | Attending: Nurse Practitioner | Admitting: Nurse Practitioner

## 2021-11-21 ENCOUNTER — Ambulatory Visit
Admission: RE | Admit: 2021-11-21 | Discharge: 2021-11-21 | Disposition: A | Discharge status: Home / Self Care | Payer: Medicare Other | Attending: Nurse Practitioner | Admitting: Nurse Practitioner

## 2021-11-21 ENCOUNTER — Encounter: Payer: Self-pay | Admitting: Nurse Practitioner

## 2021-11-21 VITALS — BP 115/78 | HR 80 | Temp 97.4°F | Ht 63.0 in | Wt 169.6 lb

## 2021-11-21 DIAGNOSIS — M1611 Unilateral primary osteoarthritis, right hip: Secondary | ICD-10-CM | POA: Diagnosis not present

## 2021-11-21 DIAGNOSIS — M79651 Pain in right thigh: Secondary | ICD-10-CM | POA: Diagnosis not present

## 2021-11-21 DIAGNOSIS — S79921A Unspecified injury of right thigh, initial encounter: Secondary | ICD-10-CM | POA: Diagnosis not present

## 2021-11-21 NOTE — Telephone Encounter (Signed)
Reason for Disposition ? Large swelling or bruise > 2 inches (5 cm) ? ?Answer Assessment - Initial Assessment Questions ?1. MECHANISM: "How did the injury happen?" (e.g., twisting injury, direct blow)  ?    Friday a headboard fell on me.   I have a large bruise on my right thigh with a lump in it.   That lump has not reduced since Friday. ?2. ONSET: "When did the injury happen?" (Minutes or hours ago)  ?    Friday.    I take a baby aspirin daily.   I can walk on my right leg. ?3. LOCATION: "Where is the injury located?"  ?    Right leg on front of my upper thigh. ?4. APPEARANCE of INJURY: "What does the injury look like?"  (e.g., deformity of leg) ?    Bruised with a lump that covers most of the thigh.   ?5. SEVERITY: "Can you put weight on that leg?" "Can you walk?"  ?    Yes ?6. SIZE: For cuts, bruises, or swelling, ask: "How large is it?" (e.g., inches or centimeters)  ?    Covers most of front of thigh. ?7. PAIN: "Is there pain?" If Yes, ask: "How bad is the pain?"   "What does it keep you from doing?" (e.g., Scale 1-10; or mild, moderate, severe) ?  -  NONE: (0): no pain ?  -  MILD (1-3): doesn't interfere with normal activities  ?  -  MODERATE (4-7): interferes with normal activities (e.g., work or school) or awakens from sleep, limping  ?  -  SEVERE (8-10): excruciating pain, unable to do any normal activities, unable to walk ?    Yes ?8. TETANUS: For any breaks in the skin, ask: "When was the last tetanus booster?" ?    *No Answer* ?9. OTHER SYMPTOMS: "Do you have any other symptoms?"  ?    No other injuries from headboard falling.    The headboard just fell over onto my leg.    ?10. PREGNANCY: "Is there any chance you are pregnant?" "When was your last menstrual period?" ?      *No Answer* ? ?Protocols used: Leg Injury-A-AH ? ?

## 2021-11-21 NOTE — Assessment & Plan Note (Signed)
Post trauma with head board falling on leg.  Will obtain imaging to assess, low suspicion for fracture but will assess further due to significant bruising and discomfort.  Recommend hold ASA for the next week then restart.  For pain may use Tylenol as needed + Voltaren gel or Icy/Hot Lidocaine patches. Recommend gentle stretching daily and wear supportive pants or thigh sleeve to support area of discomfort.  Return in 2 weeks. ?

## 2021-11-21 NOTE — Patient Instructions (Signed)
Hematoma A hematoma is a collection of blood. A hematoma can happen: Under the skin. In an organ. In a body space. In a joint space. In other tissues. The blood can thicken (clot) to form a lump that you can see and feel. The lump is often hard and may become sore and tender. The lump can be very small or very big. Most hematomas get better in a few days to weeks. However, some hematomas may be serious and need medical care. What are the causes? This condition is caused by: An injury. Blood that leaks under the skin. Problems from surgeries. Medical conditions that cause bleeding or bruising. What increases the risk? You are more likely to develop this condition if: You are an older adult. You use medicines that thin your blood. What are the signs or symptoms? Symptoms depend on where the hematoma is in your body. If the hematoma is under the skin, there is: A firm lump on the body. Pain and tenderness in the area. Bruising. The skin above the lump may be blue, dark blue, purple-red, or yellowish. If the hematoma is deep in the tissues or body spaces, there may be: Blood in the stomach. This may cause pain in the belly (abdomen), weakness, passing out (fainting), and shortness of breath. Blood in the head. This may cause a headache, weakness, trouble speaking or understanding speech, or passing out. How is this diagnosed? This condition is diagnosed based on: Your medical history. A physical exam. Imaging tests, such as ultrasound or CT scan. Blood tests. How is this treated? Treatment depends on the cause, size, and location of the hematoma. Treatment may include: Doing nothing. Many hematomas go away on their own without treatment. Surgery or close monitoring. This may be needed for large hematomas or hematomas that affect the body's organs. Medicines. These may be given if a medical condition caused the hematoma. Follow these instructions at home: Managing pain, stiffness,  and swelling  If told, put ice on the area. Put ice in a plastic bag. Place a towel between your skin and the bag. Leave the ice on for 20 minutes, 2-3 times a day for the first two days. If told, put heat on the affected area after putting ice on the area for two days. Use the heat source that your doctor tells you to use. This could be a moist heat pack or a heating pad. To do this: Place a towel between your skin and the heat source. Leave the heat on for 20-30 minutes. Remove the heat if your skin turns bright red. This is very important if you are unable to feel pain, heat, or cold. You may have a greater risk of getting burned. Raise (elevate) the affected area above the level of your heart while you are sitting or lying down. Wrap the affected area with an elastic bandage, if told by your doctor. Do not wrap the bandage too tightly. If your hematoma is on a leg or foot and is painful, your doctor may give you crutches. Use them as told by your doctor. General instructions Take over-the-counter and prescription medicines only as told by your doctor. Keep all follow-up visits as told by your doctor. This is important. Contact a doctor if: You have a fever. The swelling or bruising gets worse. You start to get more hematomas. Get help right away if: Your pain gets worse. Your pain is not getting better with medicine. Your skin over the hematoma breaks or starts to bleed.   Your hematoma is in your chest or belly and you: Pass out. Feel weak. Become short of breath. You have a hematoma on your scalp that is caused by a fall or injury, and you: Have a headache that gets worse. Have trouble speaking or understanding speech. Become less alert or you pass out. Summary A hematoma is a collection of blood in any part of your body. Most hematomas get better on their own in a few days to weeks. Some may need medical care. Follow instructions from your doctor about how to care for your  hematoma. Contact a doctor if the swelling or bruising gets worse, or if you are short of breath. This information is not intended to replace advice given to you by your health care provider. Make sure you discuss any questions you have with your health care provider. Document Revised: 04/21/2021 Document Reviewed: 04/21/2021 Elsevier Patient Education  2023 [employer]vier Inc.  

## 2021-11-21 NOTE — Telephone Encounter (Signed)
?  Chief Complaint: Large bruise on thigh from headboard falling on it. ?Symptoms: Large sore bruise with lump in the middle of it covering most of front of the thigh ?Frequency: Happened Friday ?Pertinent Negatives: Patient denies not being able to walk on it. ?Disposition: '[]'$ ED /'[]'$ Urgent Care (no appt availability in office) / '[x]'$ Appointment(In office/virtual)/ '[]'$  [facility]/ '[]'$ Home Care/ '[]'$ Refused Recommended Disposition /'[]'$ [facility]/ '[]'$  Follow-up with PCP ?Additional Notes: Appt made for today with Marnee Guarneri, NP  ?

## 2021-11-21 NOTE — Progress Notes (Signed)
? ?BP 115/78   Pulse 80   Temp (!) 97.4 ?F (36.3 ?C) (Oral)   Ht 5' 3"  (1.6 m)   Wt 169 lb 9.6 oz (76.9 kg)   SpO2 98%   BMI 30.04 kg/m?   ? ?Subjective:  ? ? Patient ID: Michelle Espinoza, female    DOB: 11/19/1950, 71 y.o.   MRN: [id number] ? ?HPI: ?Michelle Espinoza is a 71 y.o. female ? ?Chief Complaint  ?Patient presents with  ? Bleeding/Bruising  ?  Patient is here due to headboard falling on her R thigh Friday afternoon. Patient declines seeking medical attention after the incident. Patient says she has only used cold compress to the area. Patient says it just helps relax the area.   ? ?LEG PAIN (RIGHT) ?On Friday afternoon she was cleaning out garage with her family and was near a headboard which fell against her right thigh.  Did not attend urgent care after this.   ?Duration: days ?Location: right thigh ?Mechanism of injury: trauma ?Onset: sudden ?Severity: 6/10 when moving ?Quality:  dull, aching, and throbbing ?Frequency: intermittent ?Radiation: no ?Aggravating factors: weight bearing and movement  ?Alleviating factors: ice, APAP, and rest  ?Status: fluctuating ?Treatments attempted: ice and APAP  ?Relief with NSAIDs?:  No NSAIDs Taken ?Swelling: yes ?Redness: no  ?Warmth: yes ?Trauma: yes ?Fever: no ?Decreased sensation: no ?Paresthesias: no ?Weakness: no  ? ?Relevant past medical, surgical, family and social history reviewed and updated as indicated. Interim medical history since our last visit reviewed. ?Allergies and medications reviewed and updated. ? ?Review of Systems  ?Constitutional:  Negative for activity change, appetite change, diaphoresis, fatigue and fever.  ?Respiratory:  Negative for cough, chest tightness and shortness of breath.   ?Cardiovascular:  Negative for chest pain, palpitations and leg swelling.  ?Gastrointestinal: Negative.   ?Neurological: Negative.   ?Psychiatric/Behavioral: Negative.    ? ?Per HPI unless specifically indicated above ? ?   ?Objective:  ?  ?BP 115/78   Pulse  80   Temp (!) 97.4 ?F (36.3 ?C) (Oral)   Ht 5' 3"  (1.6 m)   Wt 169 lb 9.6 oz (76.9 kg)   SpO2 98%   BMI 30.04 kg/m?   ?Wt Readings from Last 3 Encounters:  ?11/21/21 169 lb 9.6 oz (76.9 kg)  ?11/18/21 173 lb 1 oz (78.5 kg)  ?11/07/21 173 lb 3.2 oz (78.6 kg)  ?  ?Physical Exam ?Vitals and nursing note reviewed.  ?Constitutional:   ?   General: She is awake. She is not in acute distress. ?   Appearance: She is well-developed. She is obese. She is not ill-appearing.  ?HENT:  ?   Head: Normocephalic.  ?   Right Ear: Hearing normal.  ?   Left Ear: Hearing normal.  ?Eyes:  ?   General: Lids are normal.     ?   Right eye: No discharge.     ?   Left eye: No discharge.  ?   Conjunctiva/sclera: Conjunctivae normal.  ?   Pupils: Pupils are equal, round, and reactive to light.  ?Neck:  ?   Thyroid: No thyromegaly.  ?   Vascular: No carotid bruit.  ?Cardiovascular:  ?   Rate and Rhythm: Normal rate and regular rhythm.  ?   Heart sounds: Normal heart sounds. No murmur heard. ?  No gallop.  ?Pulmonary:  ?   Effort: Pulmonary effort is normal. No accessory muscle usage or respiratory distress.  ?   Breath sounds: Normal breath  sounds.  ?Abdominal:  ?   General: Bowel sounds are normal.  ?   Palpations: Abdomen is soft.  ?Musculoskeletal:  ?   Cervical back: Normal range of motion and neck supple.  ?   Right upper leg: Swelling and tenderness (to area of bruising.) present. No deformity or lacerations.  ?   Left upper leg: Normal.  ?   Right lower leg: No edema.  ?   Left lower leg: No edema.  ?     Legs: ? ?Skin: ?   General: Skin is warm and dry.  ?Neurological:  ?   Mental Status: She is alert and oriented to person, place, and time.  ?Psychiatric:     ?   Attention and Perception: Attention normal.     ?   Mood and Affect: Mood normal.     ?   Speech: Speech normal.     ?   Behavior: Behavior normal. Behavior is cooperative.     ?   Thought Content: Thought content normal.  ? ? ?Results for orders placed or performed in  visit on 11/07/21  ?Microalbumin, Urine Waived  ?Result Value Ref Range  ? Microalb, Ur Waived 10 0 - 19 mg/L  ? Creatinine, Urine Waived 100 10 - 300 mg/dL  ? Microalb/Creat Ratio <30 <30 mg/g  ?CBC with Differential/Platelet  ?Result Value Ref Range  ? WBC 7.2 3.4 - 10.8 x10E3/uL  ? RBC 4.24 3.77 - 5.28 x10E6/uL  ? Hemoglobin 13.3 11.1 - 15.9 g/dL  ? Hematocrit 38.2 34.0 - 46.6 %  ? MCV 90 79 - 97 fL  ? MCH 31.4 26.6 - 33.0 pg  ? MCHC 34.8 31.5 - 35.7 g/dL  ? RDW 12.8 11.7 - 15.4 %  ? Platelets 346 150 - 450 x10E3/uL  ? Neutrophils 56 Not Estab. %  ? Lymphs 30 Not Estab. %  ? Monocytes 8 Not Estab. %  ? Eos 5 Not Estab. %  ? Basos 1 Not Estab. %  ? Neutrophils Absolute 4.0 1.4 - 7.0 x10E3/uL  ? Lymphocytes Absolute 2.1 0.7 - 3.1 x10E3/uL  ? Monocytes Absolute 0.5 0.1 - 0.9 x10E3/uL  ? EOS (ABSOLUTE) 0.4 0.0 - 0.4 x10E3/uL  ? Basophils Absolute 0.1 0.0 - 0.2 x10E3/uL  ? Immature Granulocytes 0 Not Estab. %  ? Immature Grans (Abs) 0.0 0.0 - 0.1 x10E3/uL  ?Comprehensive metabolic panel  ?Result Value Ref Range  ? Glucose 109 (H) 70 - 99 mg/dL  ? BUN 6 (L) 8 - 27 mg/dL  ? Creatinine, Ser 0.85 0.57 - 1.00 mg/dL  ? eGFR 74 >59 mL/min/1.73  ? BUN/Creatinine Ratio 7 (L) 12 - 28  ? Sodium 144 134 - 144 mmol/L  ? Potassium 3.9 3.5 - 5.2 mmol/L  ? Chloride 105 96 - 106 mmol/L  ? CO2 23 20 - 29 mmol/L  ? Calcium 9.3 8.7 - 10.3 mg/dL  ? Total Protein 6.7 6.0 - 8.5 g/dL  ? Albumin 4.4 3.8 - 4.8 g/dL  ? Globulin, Total 2.3 1.5 - 4.5 g/dL  ? Albumin/Globulin Ratio 1.9 1.2 - 2.2  ? Bilirubin Total 0.3 0.0 - 1.2 mg/dL  ? Alkaline Phosphatase 84 44 - 121 IU/L  ? AST 19 0 - 40 IU/L  ? ALT 17 0 - 32 IU/L  ?Lipid Panel w/o Chol/HDL Ratio  ?Result Value Ref Range  ? Cholesterol, Total 256 (H) 100 - 199 mg/dL  ? Triglycerides 194 (H) 0 - 149 mg/dL  ? HDL 44 >39  mg/dL  ? VLDL Cholesterol Cal 36 5 - 40 mg/dL  ? LDL Chol Calc (NIH) 176 (H) 0 - 99 mg/dL  ?TSH  ?Result Value Ref Range  ? TSH 3.950 0.450 - 4.500 uIU/mL  ?VITAMIN D 25 Hydroxy  (Vit-D Deficiency, Fractures)  ?Result Value Ref Range  ? Vit D, 25-Hydroxy 15.7 (L) 30.0 - 100.0 ng/mL  ?Vitamin B12  ?Result Value Ref Range  ? Vitamin B-12 1,429 (H) 232 - 1,245 pg/mL  ?Thyroid peroxidase antibody  ?Result Value Ref Range  ? Thyroperoxidase Ab SerPl-aCnc 62 (H) 0 - 34 IU/mL  ?T4, free  ?Result Value Ref Range  ? Free T4 1.12 0.82 - 1.77 ng/dL  ? ?   ?Assessment & Plan:  ? ?Problem List Items Addressed This Visit   ? ?  ? Other  ? Right thigh pain - Primary  ?  Post trauma with head board falling on leg.  Will obtain imaging to assess, low suspicion for fracture but will assess further due to significant bruising and discomfort.  Recommend hold ASA for the next week then restart.  For pain may use Tylenol as needed + Voltaren gel or Icy/Hot Lidocaine patches. Recommend gentle stretching daily and wear supportive pants or thigh sleeve to support area of discomfort.  Return in 2 weeks. ? ?  ?  ? Relevant Orders  ? DG FEMUR, MIN 2 VIEWS RIGHT  ?  ? ?Follow up plan: ?Return in about 2 weeks (around 12/05/2021) for Right Thigh pain . ? ? ? ? ? ?

## 2021-11-22 ENCOUNTER — Other Ambulatory Visit: Payer: Self-pay | Admitting: Acute Care

## 2021-11-22 ENCOUNTER — Encounter: Payer: Self-pay | Admitting: Nurse Practitioner

## 2021-11-22 DIAGNOSIS — Z87891 Personal history of nicotine dependence: Secondary | ICD-10-CM

## 2021-11-22 DIAGNOSIS — Z122 Encounter for screening for malignant neoplasm of respiratory organs: Secondary | ICD-10-CM

## 2021-11-22 DIAGNOSIS — F17210 Nicotine dependence, cigarettes, uncomplicated: Secondary | ICD-10-CM

## 2021-11-23 NOTE — Progress Notes (Signed)
Contacted via [place] ? ? ?Good morning Kaegan, your imaging has returned and overall no fractures noted.  There is some moderate amount of arthritis at hip joint though and this can cause discomfort over time.  Glad there are no fractures!!  Any questions? ?Keep being amazing!!  Thank you for allowing me to participate in your care.  I appreciate you. ?Kindest regards, ?Sierra Bissonette

## 2021-12-04 DIAGNOSIS — F17210 Nicotine dependence, cigarettes, uncomplicated: Secondary | ICD-10-CM | POA: Insufficient documentation

## 2021-12-04 NOTE — Patient Instructions (Incomplete)
Radicular Pain Radicular pain is a type of pain that spreads from your back or neck along a spinal nerve. Spinal nerves are nerves that leave the spinal cord and go to the muscles. Radicular pain is sometimes called radiculopathy, radiculitis, or a pinched nerve. When you have this type of pain, you may also have weakness, numbness, or tingling in the area of your body that is supplied by the nerve. The pain may feel sharp and burning. Depending on which spinal nerve is affected, the pain may occur in the: Neck area (cervical radicular pain). You may also feel pain, numbness, weakness, or tingling in the arms. Mid-spine area (thoracic radicular pain). You would feel this pain in the back and chest. This type is rare. Lower back area (lumbar radicular pain). You would feel this pain as low back pain. You may feel pain, numbness, weakness, or tingling in the buttocks or legs. Sciatica is a type of lumbar radicular pain that shoots down the back of the leg. Radicular pain occurs when one of the spinal nerves becomes irritated or squeezed (compressed). It is often caused by something pushing on a spinal nerve, such as one of the bones of the spine (vertebrae) or one of the round cushions between vertebrae (intervertebral disks). This can result from: An injury. Wear and tear or aging of a disk. The growth of a bone spur that pushes on the nerve. Radicular pain often goes away when you follow instructions from your health care provider for relieving pain at home. How is this treated? Treatment may depend on the cause of the condition and may include: Working with a physical therapist. Taking pain medicine. Applying heat or ice or both to the affected areas. Doing stretches to improve flexibility. Having surgery. This may be needed if other treatments do not help. Different types of surgery may be done depending on the cause of this condition. Follow these instructions at home: Managing pain     If  directed, put ice on the affected area. To do this: Put ice in a plastic bag. Place a towel between your skin and the bag. Leave the ice on for 20 minutes, 2-3 times a day. Remove the ice if your skin turns bright red. This is very important. If you cannot feel pain, heat, or cold, you have a greater risk of damage to the area. If directed, apply heat to the affected area as often as told by your health care provider. Use the heat source that your health care provider recommends, such as a moist heat pack or a heating pad. Place a towel between your skin and the heat source. Leave the heat on for 20-30 minutes. Remove the heat if your skin turns bright red. This is especially important if you are unable to feel pain, heat, or cold. You have a greater risk of getting burned. Activity Do not sit or rest in bed for long periods of time. Try to stay as active as possible. Ask your health care provider what type of exercise or activity is best for you. Avoid activities that make your pain worse, such as bending and lifting. You may have to avoid lifting. Ask your health care provider how much you can safely lift. Practice using proper technique when lifting items. Proper lifting technique involves bending your knees and rising up. Do strength and range-of-motion exercises only as told by your health care provider or physical therapist. General instructions Take over-the-counter and prescription medicines only as told by your  health care provider. Pay attention to any changes in your symptoms. Keep all follow-up visits. This is important. Contact a health care provider if: Your pain and other symptoms get worse. Your pain medicine is not helping. Your pain has not improved after a few weeks of home care. You have a fever. Get help right away if: You have severe pain, weakness, or numbness. You have difficulty with bladder or bowel control. Summary Radicular pain is a type of pain that spreads  from your back or neck along a spinal nerve. When you have radicular pain, you may also have weakness, numbness, or tingling in the area of your body that is supplied by the nerve. The pain may feel sharp or burning. Radicular pain may be treated with ice, heat, medicines, or physical therapy. This information is not intended to replace advice given to you by your health care provider. Make sure you discuss any questions you have with your health care provider. Document Revised: 12/30/2020 Document Reviewed: 12/30/2020 Elsevier Patient Education  2023 Elsevier Inc.  

## 2021-12-06 ENCOUNTER — Ambulatory Visit (INDEPENDENT_AMBULATORY_CARE_PROVIDER_SITE_OTHER): Payer: Medicare Other | Admitting: Nurse Practitioner

## 2021-12-06 ENCOUNTER — Encounter: Payer: Self-pay | Admitting: Nurse Practitioner

## 2021-12-06 DIAGNOSIS — M79651 Pain in right thigh: Secondary | ICD-10-CM

## 2021-12-06 DIAGNOSIS — F17210 Nicotine dependence, cigarettes, uncomplicated: Secondary | ICD-10-CM

## 2021-12-06 DIAGNOSIS — S7011XA Contusion of right thigh, initial encounter: Secondary | ICD-10-CM | POA: Diagnosis not present

## 2021-12-06 NOTE — Progress Notes (Signed)
BP 130/84   Pulse 76   Temp 97.8 F (36.6 C) (Oral)   Ht _0  (1.6 m)   Wt 171 lb 3.2 oz (77.7 kg)   SpO2 99%   BMI 30.33 kg/m    Subjective:    Patient ID: Michelle Espinoza, female    DOB: Jul 17, 1950, 71 y.o.   MRN: [id number]  HPI: Michelle Espinoza is a 71 y.o. female  Chief Complaint  Patient presents with   Thigh Pain    Patient is here for a two week follow up. Patient states she thinks the area has grown in size. Patient states it feels hard in certain spots of the area. Patient states the area is tender to the touch.    LEG PAIN (RIGHT) Follow-up for thigh pain, initially seen 11/21/21.  Pain started after she was cleaning out garage with her family and was near a headboard which fell against her right thigh.  Had imaging performed with no fracture noted, there were mild arthritic changes of the right hip.  She reports today that swelling is larger and it is tender to touch around outside. Duration: days Location: right thigh Mechanism of injury: trauma Onset: sudden Severity: 6/10 when moving Quality:  dull, aching, and throbbing Frequency: intermittent Radiation: no Aggravating factors: weight bearing and movement  Alleviating factors: ice, APAP, and rest  Status: fluctuating Treatments attempted: ice and APAP  Relief with NSAIDs?:  No NSAIDs Taken Swelling: yes -- feels this has increased Redness: no  Warmth: yes occasionally Trauma: yes Fever: no Decreased sensation: no Paresthesias: no Weakness: no   Relevant past medical, surgical, family and social history reviewed and updated as indicated. Interim medical history since our last visit reviewed. Allergies and medications reviewed and updated.  Review of Systems  Constitutional:  Negative for activity change, appetite change, diaphoresis, fatigue and fever.  Respiratory:  Negative for cough, chest tightness and shortness of breath.   Cardiovascular:  Negative for chest pain, palpitations and leg  swelling.  Gastrointestinal: Negative.   Neurological: Negative.   Psychiatric/Behavioral: Negative.     Per HPI unless specifically indicated above     Objective:    BP 130/84   Pulse 76   Temp 97.8 F (36.6 C) (Oral)   Ht _1  (1.6 m)   Wt 171 lb 3.2 oz (77.7 kg)   SpO2 99%   BMI 30.33 kg/m   Wt Readings from Last 3 Encounters:  12/06/21 171 lb 3.2 oz (77.7 kg)  11/21/21 169 lb 9.6 oz (76.9 kg)  11/18/21 173 lb 1 oz (78.5 kg)    Physical Exam Vitals and nursing note reviewed.  Constitutional:      General: She is awake. She is not in acute distress.    Appearance: She is well-developed. She is obese. She is not ill-appearing.  HENT:     Head: Normocephalic.     Right Ear: Hearing normal.     Left Ear: Hearing normal.  Eyes:     General: Lids are normal.        Right eye: No discharge.        Left eye: No discharge.     Conjunctiva/sclera: Conjunctivae normal.     Pupils: Pupils are equal, round, and reactive to light.  Neck:     Thyroid: No thyromegaly.     Vascular: No carotid bruit.  Cardiovascular:     Rate and Rhythm: Normal rate and regular rhythm.     Heart sounds: Normal  heart sounds. No murmur heard.   No gallop.  Pulmonary:     Effort: Pulmonary effort is normal. No accessory muscle usage or respiratory distress.     Breath sounds: Normal breath sounds.  Abdominal:     General: Bowel sounds are normal.     Palpations: Abdomen is soft.  Musculoskeletal:     Cervical back: Normal range of motion and neck supple.     Right upper leg: Swelling (moderate size fluid filled hematoma present) and tenderness (around exterior of swelling and firm to touch) present. No deformity or lacerations.     Left upper leg: Normal.     Right lower leg: No edema.     Left lower leg: No edema.       Legs:  Skin:    General: Skin is warm and dry.  Neurological:     Mental Status: She is alert and oriented to person, place, and time.  Psychiatric:        Attention  and Perception: Attention normal.        Mood and Affect: Mood normal.        Speech: Speech normal.        Behavior: Behavior normal. Behavior is cooperative.        Thought Content: Thought content normal.    Results for orders placed or performed in visit on 11/07/21  Microalbumin, Urine Waived  Result Value Ref Range   Microalb, Ur Waived 10 0 - 19 mg/L   Creatinine, Urine Waived 100 10 - 300 mg/dL   Microalb/Creat Ratio <30 <30 mg/g  CBC with Differential/Platelet  Result Value Ref Range   WBC 7.2 3.4 - 10.8 x10E3/uL   RBC 4.24 3.77 - 5.28 x10E6/uL   Hemoglobin 13.3 11.1 - 15.9 g/dL   Hematocrit 38.2 34.0 - 46.6 %   MCV 90 79 - 97 fL   MCH 31.4 26.6 - 33.0 pg   MCHC 34.8 31.5 - 35.7 g/dL   RDW 12.8 11.7 - 15.4 %   Platelets 346 150 - 450 x10E3/uL   Neutrophils 56 Not Estab. %   Lymphs 30 Not Estab. %   Monocytes 8 Not Estab. %   Eos 5 Not Estab. %   Basos 1 Not Estab. %   Neutrophils Absolute 4.0 1.4 - 7.0 x10E3/uL   Lymphocytes Absolute 2.1 0.7 - 3.1 x10E3/uL   Monocytes Absolute 0.5 0.1 - 0.9 x10E3/uL   EOS (ABSOLUTE) 0.4 0.0 - 0.4 x10E3/uL   Basophils Absolute 0.1 0.0 - 0.2 x10E3/uL   Immature Granulocytes 0 Not Estab. %   Immature Grans (Abs) 0.0 0.0 - 0.1 x10E3/uL  Comprehensive metabolic panel  Result Value Ref Range   Glucose 109 (H) 70 - 99 mg/dL   BUN 6 (L) 8 - 27 mg/dL   Creatinine, Ser 0.85 0.57 - 1.00 mg/dL   eGFR 74 >59 mL/min/1.73   BUN/Creatinine Ratio 7 (L) 12 - 28   Sodium 144 134 - 144 mmol/L   Potassium 3.9 3.5 - 5.2 mmol/L   Chloride 105 96 - 106 mmol/L   CO2 23 20 - 29 mmol/L   Calcium 9.3 8.7 - 10.3 mg/dL   Total Protein 6.7 6.0 - 8.5 g/dL   Albumin 4.4 3.8 - 4.8 g/dL   Globulin, Total 2.3 1.5 - 4.5 g/dL   Albumin/Globulin Ratio 1.9 1.2 - 2.2   Bilirubin Total 0.3 0.0 - 1.2 mg/dL   Alkaline Phosphatase 84 44 - 121 IU/L   AST 19 0 -  40 IU/L   ALT 17 0 - 32 IU/L  Lipid Panel w/o Chol/HDL Ratio  Result Value Ref Range   Cholesterol,  Total 256 (H) 100 - 199 mg/dL   Triglycerides 194 (H) 0 - 149 mg/dL   HDL 44 >39 mg/dL   VLDL Cholesterol Cal 36 5 - 40 mg/dL   LDL Chol Calc (NIH) 176 (H) 0 - 99 mg/dL  TSH  Result Value Ref Range   TSH 3.950 0.450 - 4.500 uIU/mL  VITAMIN D 25 Hydroxy (Vit-D Deficiency, Fractures)  Result Value Ref Range   Vit D, 25-Hydroxy 15.7 (L) 30.0 - 100.0 ng/mL  Vitamin B12  Result Value Ref Range   Vitamin B-12 1,429 (H) 232 - 1,245 pg/mL  Thyroid peroxidase antibody  Result Value Ref Range   Thyroperoxidase Ab SerPl-aCnc 62 (H) 0 - 34 IU/mL  T4, free  Result Value Ref Range   Free T4 1.12 0.82 - 1.77 ng/dL      Assessment & Plan:   Problem List Items Addressed This Visit       Other   Nicotine dependence, cigarettes, uncomplicated   Right thigh pain    Post trauma with head board falling on leg, imaging showed no fracture recently but swelling (area of hematoma) has increased in size.  Recommend hold ASA for the next week then restart.  For pain may use Tylenol as needed + Voltaren gel or Icy/Hot Lidocaine patches. Recommend gentle stretching daily and wear supportive pants or thigh sleeve to support area of discomfort.  She will head to [facility] walk-in this afternoon to further assess and see if draining area is appropriate.           Follow up plan: Return for as scheduled in future.

## 2021-12-06 NOTE — Assessment & Plan Note (Signed)
Post trauma with head board falling on leg, imaging showed no fracture recently but swelling (area of hematoma) has increased in size.  Recommend hold ASA for the next week then restart.  For pain may use Tylenol as needed + Voltaren gel or Icy/Hot Lidocaine patches. Recommend gentle stretching daily and wear supportive pants or thigh sleeve to support area of discomfort.  She will head to [facility] walk-in this afternoon to further assess and see if draining area is appropriate.

## 2021-12-13 DIAGNOSIS — S7011XA Contusion of right thigh, initial encounter: Secondary | ICD-10-CM | POA: Diagnosis not present

## 2021-12-20 DIAGNOSIS — S7011XA Contusion of right thigh, initial encounter: Secondary | ICD-10-CM | POA: Diagnosis not present

## 2021-12-21 DIAGNOSIS — F330 Major depressive disorder, recurrent, mild: Secondary | ICD-10-CM | POA: Diagnosis not present

## 2022-01-16 ENCOUNTER — Ambulatory Visit: Payer: Medicare Other | Admitting: Plastic Surgery

## 2022-01-16 VITALS — BP 111/75 | HR 100 | Ht 63.0 in | Wt 168.8 lb

## 2022-01-16 DIAGNOSIS — T148XXA Other injury of unspecified body region, initial encounter: Secondary | ICD-10-CM

## 2022-01-16 NOTE — Progress Notes (Signed)
   Referring Provider Venita Lick, NP [place],  [place] [address]   CC:  Right thigh hematoma or seroma   Michelle Espinoza is an 71 y.o. female.  HPI: Patient is 71 year old with a right thigh hematoma or seroma after a headboard fell on her leg on 5/12.  She was referred by [facility].  She has had an MRI previously she says showed fluid collection.  She has had this drained one time and the fluid collection returned.    Allergies  Allergen Reactions   Lovastatin     Elevated LFT   Penicillins Rash    Outpatient Encounter Medications as of 01/16/2022  Medication Sig   aspirin EC 81 MG tablet Take 81 mg by mouth daily.   buPROPion (WELLBUTRIN SR) 100 MG 12 hr tablet Take 100 mg by mouth in the morning and at bedtime.   [employer] VITAMIN B12 1000 MCG tablet Take 1,000 mcg by mouth in the morning and at bedtime.   ezetimibe (ZETIA) 10 MG tablet Take 1 tablet (10 mg total) by mouth daily.   fexofenadine (ALLEGRA) 180 MG tablet Take 180 mg by mouth daily.   FLUoxetine (PROZAC) 20 MG capsule Take 20 mg by mouth daily.   levothyroxine (SYNTHROID) 25 MCG tablet Take 1 tablet (25 mcg total) by mouth daily before breakfast.   losartan-hydrochlorothiazide (HYZAAR) 100-12.5 MG tablet Take 1 tablet by mouth daily.   Magnesium Hydroxide (MAGNESIA PO) Take by mouth.   Multiple Vitamin (MULTIVITAMIN) tablet Take 1 tablet by mouth daily.   Multiple Vitamins-Minerals (VITAMIN D3 COMPLETE PO) Take by mouth.   pravastatin (PRAVACHOL) 20 MG tablet Take 1 tablet (20 mg total) by mouth daily.   No facility-administered encounter medications on file as of 01/16/2022.     Past Medical History:  Diagnosis Date   Allergy    Depression    Essential hypertension    Hyperlipidemia    Restless legs     Past Surgical History:  Procedure Laterality Date   ABDOMINAL HYSTERECTOMY      Family History  Adopted: Yes  Problem Relation Age of Onset   Breast cancer Neg Hx     Social History    Social History Narrative   Not on file     Review of Systems General: Denies fevers, chills, weight loss CV: Denies chest pain, shortness of breath, palpitations   Physical Exam    01/16/2022   10:44 AM 12/06/2021    1:13 PM 11/21/2021    2:27 PM  Vitals with BMI  Height '5\' 3"'$  '5\' 3"'$  '5\' 3"'$   Weight 168 lbs 13 oz 171 lbs 3 oz 169 lbs 10 oz  BMI 29.91 41.74 08.14  Systolic 481 856 314  Diastolic 75 84 78  Pulse 970 76 80    General:  No acute distress,  Alert and oriented, Non-Toxic, Normal speech and affect Extremity: 9 x 8 cm area of fullness right thigh.  Assessment/Plan Surgical drainage of this would be an option with drain placement but I would like to see if interventional radiology is able to place a drain percutaneously likely under ultrasound guidance.  This would avoid an incision.  A referral was placed for interventional radiology and we will see if they are able to perform this.    Lennice Sites 01/16/2022, 1:17 PM

## 2022-01-23 ENCOUNTER — Ambulatory Visit
Admission: RE | Admit: 2022-01-23 | Discharge: 2022-01-23 | Disposition: A | Discharge status: Home / Self Care | Payer: Medicare Other | Source: Ambulatory Visit | Attending: Nurse Practitioner | Admitting: Nurse Practitioner

## 2022-01-23 DIAGNOSIS — Z1231 Encounter for screening mammogram for malignant neoplasm of breast: Secondary | ICD-10-CM | POA: Diagnosis not present

## 2022-01-24 NOTE — Progress Notes (Signed)
Contacted via MyChart   Normal mammogram, may repeat in one year:)

## 2022-04-20 DIAGNOSIS — F330 Major depressive disorder, recurrent, mild: Secondary | ICD-10-CM | POA: Diagnosis not present

## 2022-05-07 NOTE — Patient Instructions (Signed)

## 2022-05-10 ENCOUNTER — Ambulatory Visit (INDEPENDENT_AMBULATORY_CARE_PROVIDER_SITE_OTHER): Payer: Medicare Other | Admitting: Nurse Practitioner

## 2022-05-10 ENCOUNTER — Encounter: Payer: Self-pay | Admitting: Nurse Practitioner

## 2022-05-10 VITALS — BP 128/84 | HR 68 | Temp 97.7°F | Ht 63.0 in | Wt 170.8 lb

## 2022-05-10 DIAGNOSIS — J432 Centrilobular emphysema: Secondary | ICD-10-CM

## 2022-05-10 DIAGNOSIS — F17210 Nicotine dependence, cigarettes, uncomplicated: Secondary | ICD-10-CM

## 2022-05-10 DIAGNOSIS — I700 Atherosclerosis of aorta: Secondary | ICD-10-CM | POA: Diagnosis not present

## 2022-05-10 DIAGNOSIS — I10 Essential (primary) hypertension: Secondary | ICD-10-CM

## 2022-05-10 DIAGNOSIS — E782 Mixed hyperlipidemia: Secondary | ICD-10-CM

## 2022-05-10 DIAGNOSIS — E559 Vitamin D deficiency, unspecified: Secondary | ICD-10-CM | POA: Diagnosis not present

## 2022-05-10 DIAGNOSIS — F3342 Major depressive disorder, recurrent, in full remission: Secondary | ICD-10-CM

## 2022-05-10 DIAGNOSIS — Z23 Encounter for immunization: Secondary | ICD-10-CM | POA: Diagnosis not present

## 2022-05-10 DIAGNOSIS — G2581 Restless legs syndrome: Secondary | ICD-10-CM

## 2022-05-10 DIAGNOSIS — E063 Autoimmune thyroiditis: Secondary | ICD-10-CM

## 2022-05-10 DIAGNOSIS — E6609 Other obesity due to excess calories: Secondary | ICD-10-CM

## 2022-05-10 DIAGNOSIS — Z6830 Body mass index (BMI) 30.0-30.9, adult: Secondary | ICD-10-CM

## 2022-05-10 NOTE — Assessment & Plan Note (Addendum)
Chronic, stable.  Denies SI/HI.  Continue current medication regimen as prescribed by psychiatry and collaboration with psychiatry.  Recent note reviewed with patient.

## 2022-05-10 NOTE — Assessment & Plan Note (Addendum)
Chronic.  Noted on CT lung screening -- at this time recommend complete cessation of smoking and continue statin therapy for prevention.

## 2022-05-10 NOTE — Assessment & Plan Note (Signed)
Noted past labs, recommend she continue supplement and adjust as needed.  Check level today.

## 2022-05-10 NOTE — Assessment & Plan Note (Signed)
Chronic, ongoing.  Continue Levothyroxine 25 MCG daily.  Continue current medication regimen and adjust as needed.  Recent labs stable.

## 2022-05-10 NOTE — Assessment & Plan Note (Signed)
Chronic, ongoing.  Initial BP elevated due to recent cigarette, but repeat at goal.  Recommend she monitor BP at least a few mornings a week at home and document.  DASH diet at home.  Continue current medication regimen and adjust as needed.  Labs today: CMP.  Return in 6 months.

## 2022-05-10 NOTE — Assessment & Plan Note (Signed)
I have recommended complete cessation of tobacco use. I have discussed various options available for assistance with tobacco cessation including over the counter methods (Nicotine gum, patch and lozenges). We also discussed prescription options (Chantix, Nicotine Inhaler / Nasal Spray). The patient is not interested in pursuing any prescription tobacco cessation options at this time.  Continue annual screening.

## 2022-05-10 NOTE — Progress Notes (Signed)
BP 128/84 (BP Location: Left Arm, Patient Position: Sitting, Cuff Size: Normal) Comment: had cigarette prior to office visit  Pulse 68   Temp 97.7 F (36.5 C) (Oral)   Ht _0  (1.6 m)   Wt 170 lb 12.8 oz (77.5 kg)   SpO2 97%   BMI 30.26 kg/m    Subjective:    Patient ID: Michelle Espinoza, female    DOB: Mar 11, 1951, 71 y.o.   MRN: [id number]  HPI: Michelle Espinoza is a 71 y.o. female  Chief Complaint  Patient presents with   Hyperlipidemia   Hypertension   Mood   COPD   Hypothyroidism   HYPERTENSION / HYPERLIPIDEMIA Taking Losartan-HCTZ 100-12.5 daily and Zetia + Pravastatin, occasional missed doses. Satisfied with current treatment? yes Duration of hypertension: chronic BP monitoring frequency: not checking BP range:  BP medication side effects: no Duration of hyperlipidemia: chronic Cholesterol medication side effects: no Cholesterol supplements: none Medication compliance: fair compliance Aspirin: yes Recent stressors: no Recurrent headaches: no Visual changes: no Palpitations: no Dyspnea: no Chest pain: no Lower extremity edema: no Dizzy/lightheaded: no   COPD Is a smoker, smokes 1/2 PPD.  Has smoked since she was 17-18.  Last lung screening in May 2023 noted centrilobular emphysema and aortic atherosclerosis + cholelithiasis.  No current inhalers.   COPD status: stable Satisfied with current treatment?: yes Oxygen use: no Dyspnea frequency: no Cough frequency: baseline no worsening Rescue inhaler frequency:  none present Limitation of activity: no Productive cough: none Last Spirometry: no recent Pneumovax: Up to Date Influenza: Up to Date   HYPOTHYROIDISM Continues on Levothyroxine 25 MCG daily. Thyroid control status: controlled Satisfied with current treatment? yes Medication side effects: no Medication compliance: fair compliance Etiology of hypothyroidism:  Recent dose adjustment:no Fatigue: no Cold intolerance: no Heat intolerance:  no Weight gain: no Weight loss: no Constipation: no Diarrhea/loose stools: no Palpitations: no Lower extremity edema: no Anxiety/depressed mood: no   DEPRESSION Currently taking Wellbutrin and Prozac daily.  Followed by Dr. Nicolasa Ducking, last saw 04/20/22. Mood status: stable Satisfied with current treatment?: yes Symptom severity: moderate  Duration of current treatment : chronic Side effects: no Medication compliance: good compliance Psychotherapy/counseling: yes current Depressed mood: no Anxious mood: no Anhedonia: no Significant weight loss or gain: no Insomnia: none Fatigue: on occasion Feelings of worthlessness or guilt: no Impaired concentration/indecisiveness: no Suicidal ideations: no Hopelessness: no Crying spells: no    05/10/2022   10:14 AM 12/06/2021    1:18 PM 11/21/2021    2:33 PM 11/07/2021    1:16 PM 06/01/2021   12:18 PM  Depression screen PHQ 2/9  Decreased Interest 0 0 0 0 0  Down, Depressed, Hopeless 0 0 0 0 0  PHQ - 2 Score 0 0 0 0 0  Altered sleeping 0 0 0 0   Tired, decreased energy 0 0 0 0   Change in appetite 0 0 0 0   Feeling bad or failure about yourself  0 0 0 0   Trouble concentrating 0 0 0 0   Moving slowly or fidgety/restless 0 0 0 0   Suicidal thoughts 0 0 0 0   PHQ-9 Score 0 0 0 0   Difficult doing work/chores Not difficult at all   Not difficult at all        05/10/2022   10:14 AM 12/06/2021    1:18 PM 11/21/2021    2:33 PM 05/09/2019    2:46 PM  GAD 7 : Generalized Anxiety  Score  Nervous, Anxious, on Edge 0 0 0 0  Control/stop worrying 0 0 0 0  Worry too much - different things 0 0 0 0  Trouble relaxing 0 0 0 0  Restless 0 0 0 0  Easily annoyed or irritable 0 0 0 0  Afraid - awful might happen 0 0 0 0  Total GAD 7 Score 0 0 0 0  Anxiety Difficulty Not difficult at all  Not difficult at all     Relevant past medical, surgical, family and social history reviewed and updated as indicated. Interim medical history since our last  visit reviewed. Allergies and medications reviewed and updated.  Review of Systems  Constitutional:  Negative for activity change, appetite change, diaphoresis, fatigue and fever.  Respiratory:  Negative for cough, chest tightness and shortness of breath.   Cardiovascular:  Negative for chest pain, palpitations and leg swelling.  Gastrointestinal: Negative.   Endocrine: Negative for cold intolerance and heat intolerance.  Neurological: Negative.   Psychiatric/Behavioral: Negative.      Per HPI unless specifically indicated above     Objective:    BP 128/84 (BP Location: Left Arm, Patient Position: Sitting, Cuff Size: Normal) Comment: had cigarette prior to office visit  Pulse 68   Temp 97.7 F (36.5 C) (Oral)   Ht _0  (1.6 m)   Wt 170 lb 12.8 oz (77.5 kg)   SpO2 97%   BMI 30.26 kg/m   Wt Readings from Last 3 Encounters:  05/10/22 170 lb 12.8 oz (77.5 kg)  01/16/22 168 lb 12.8 oz (76.6 kg)  12/06/21 171 lb 3.2 oz (77.7 kg)    Physical Exam Vitals and nursing note reviewed.  Constitutional:      General: She is awake. She is not in acute distress.    Appearance: She is well-developed. She is obese. She is not ill-appearing.  HENT:     Head: Normocephalic.     Right Ear: Hearing normal.     Left Ear: Hearing normal.  Eyes:     General: Lids are normal.        Right eye: No discharge.        Left eye: No discharge.     Conjunctiva/sclera: Conjunctivae normal.     Pupils: Pupils are equal, round, and reactive to light.  Neck:     Thyroid: No thyromegaly.     Vascular: No carotid bruit.  Cardiovascular:     Rate and Rhythm: Normal rate and regular rhythm.     Heart sounds: Normal heart sounds. No murmur heard.    No gallop.  Pulmonary:     Effort: Pulmonary effort is normal. No accessory muscle usage or respiratory distress.     Breath sounds: Normal breath sounds.  Abdominal:     General: Bowel sounds are normal.     Palpations: Abdomen is soft.   Musculoskeletal:     Cervical back: Normal range of motion and neck supple.     Right lower leg: No edema.     Left lower leg: No edema.  Skin:    General: Skin is warm and dry.  Neurological:     Mental Status: She is alert and oriented to person, place, and time.  Psychiatric:        Attention and Perception: Attention normal.        Mood and Affect: Mood normal.        Speech: Speech normal.        Behavior: Behavior normal.  Behavior is cooperative.        Thought Content: Thought content normal.     Results for orders placed or performed in visit on 11/07/21  Microalbumin, Urine Waived  Result Value Ref Range   Microalb, Ur Waived 10 0 - 19 mg/L   Creatinine, Urine Waived 100 10 - 300 mg/dL   Microalb/Creat Ratio <30 <30 mg/g  CBC with Differential/Platelet  Result Value Ref Range   WBC 7.2 3.4 - 10.8 x10E3/uL   RBC 4.24 3.77 - 5.28 x10E6/uL   Hemoglobin 13.3 11.1 - 15.9 g/dL   Hematocrit 38.2 34.0 - 46.6 %   MCV 90 79 - 97 fL   MCH 31.4 26.6 - 33.0 pg   MCHC 34.8 31.5 - 35.7 g/dL   RDW 12.8 11.7 - 15.4 %   Platelets 346 150 - 450 x10E3/uL   Neutrophils 56 Not Estab. %   Lymphs 30 Not Estab. %   Monocytes 8 Not Estab. %   Eos 5 Not Estab. %   Basos 1 Not Estab. %   Neutrophils Absolute 4.0 1.4 - 7.0 x10E3/uL   Lymphocytes Absolute 2.1 0.7 - 3.1 x10E3/uL   Monocytes Absolute 0.5 0.1 - 0.9 x10E3/uL   EOS (ABSOLUTE) 0.4 0.0 - 0.4 x10E3/uL   Basophils Absolute 0.1 0.0 - 0.2 x10E3/uL   Immature Granulocytes 0 Not Estab. %   Immature Grans (Abs) 0.0 0.0 - 0.1 x10E3/uL  Comprehensive metabolic panel  Result Value Ref Range   Glucose 109 (H) 70 - 99 mg/dL   BUN 6 (L) 8 - 27 mg/dL   Creatinine, Ser 0.85 0.57 - 1.00 mg/dL   eGFR 74 >59 mL/min/1.73   BUN/Creatinine Ratio 7 (L) 12 - 28   Sodium 144 134 - 144 mmol/L   Potassium 3.9 3.5 - 5.2 mmol/L   Chloride 105 96 - 106 mmol/L   CO2 23 20 - 29 mmol/L   Calcium 9.3 8.7 - 10.3 mg/dL   Total Protein 6.7 6.0 - 8.5  g/dL   Albumin 4.4 3.8 - 4.8 g/dL   Globulin, Total 2.3 1.5 - 4.5 g/dL   Albumin/Globulin Ratio 1.9 1.2 - 2.2   Bilirubin Total 0.3 0.0 - 1.2 mg/dL   Alkaline Phosphatase 84 44 - 121 IU/L   AST 19 0 - 40 IU/L   ALT 17 0 - 32 IU/L  Lipid Panel w/o Chol/HDL Ratio  Result Value Ref Range   Cholesterol, Total 256 (H) 100 - 199 mg/dL   Triglycerides 194 (H) 0 - 149 mg/dL   HDL 44 >39 mg/dL   VLDL Cholesterol Cal 36 5 - 40 mg/dL   LDL Chol Calc (NIH) 176 (H) 0 - 99 mg/dL  TSH  Result Value Ref Range   TSH 3.950 0.450 - 4.500 uIU/mL  VITAMIN D 25 Hydroxy (Vit-D Deficiency, Fractures)  Result Value Ref Range   Vit D, 25-Hydroxy 15.7 (L) 30.0 - 100.0 ng/mL  Vitamin B12  Result Value Ref Range   Vitamin B-12 1,429 (H) 232 - 1,245 pg/mL  Thyroid peroxidase antibody  Result Value Ref Range   Thyroperoxidase Ab SerPl-aCnc 62 (H) 0 - 34 IU/mL  T4, free  Result Value Ref Range   Free T4 1.12 0.82 - 1.77 ng/dL      Assessment & Plan:   Problem List Items Addressed This Visit       Cardiovascular and Mediastinum   Aortic atherosclerosis (HCC)    Chronic.  Noted on CT lung screening -- at this time  recommend complete cessation of smoking and continue statin therapy for prevention.      Essential hypertension    Chronic, ongoing.  Initial BP elevated due to recent cigarette, but repeat at goal.  Recommend she monitor BP at least a few mornings a week at home and document.  DASH diet at home.  Continue current medication regimen and adjust as needed.  Labs today: CMP.  Return in 6 months.        Relevant Orders   Comprehensive metabolic panel     Respiratory   Centrilobular emphysema ([place]) - Primary    Ongoing in long time smoker and noted on CT lung screening.  Recommend complete cessation of smoking.  No current inhalers and stable. Labs up to date.  Plan on spirometry next visit.  Educated her at length on emphysema.        Endocrine   Hashimoto's thyroiditis    Chronic,  ongoing.  Continue Levothyroxine 25 MCG daily.  Continue current medication regimen and adjust as needed.  Recent labs stable.        Other   Hyperlipidemia    Chronic, stable.  Continue current medication regimen and adjust as needed.  Lipid panel today.      Relevant Orders   Comprehensive metabolic panel   Lipid Panel w/o Chol/HDL Ratio   Nicotine dependence, cigarettes, uncomplicated    I have recommended complete cessation of tobacco use. I have discussed various options available for assistance with tobacco cessation including over the counter methods (Nicotine gum, patch and lozenges). We also discussed prescription options (Chantix, Nicotine Inhaler / Nasal Spray). The patient is not interested in pursuing any prescription tobacco cessation options at this time.  Continue annual screening.       Obesity    BMI 30.26.  Recommended eating smaller high protein, low fat meals more frequently and exercising 30 mins a day 5 times a week with a goal of 10-15lb weight loss in the next 3 months. Patient voiced their understanding and motivation to adhere to these recommendations.       Recurrent major depressive disorder, in full remission (HCC)    Chronic, stable.  Denies SI/HI.  Continue current medication regimen as prescribed by psychiatry and collaboration with psychiatry.  Recent note reviewed with patient.      Vitamin D deficiency    Noted past labs, recommend she continue supplement and adjust as needed.  Check level today.      Relevant Orders   VITAMIN D 25 Hydroxy (Vit-D Deficiency, Fractures)   Other Visit Diagnoses     Flu vaccine need       Flu vaccine in office today.   Relevant Orders   Flu Vaccine QUAD High Dose(Fluad) (Completed)        Follow up plan: Return in about 6 months (around 11/08/2022) for Annual physical.

## 2022-05-10 NOTE — Assessment & Plan Note (Signed)
Ongoing in long time smoker and noted on CT lung screening.  Recommend complete cessation of smoking.  No current inhalers and stable. Labs up to date.  Plan on spirometry next visit.  Educated her at length on emphysema.

## 2022-05-10 NOTE — Assessment & Plan Note (Signed)
Chronic, stable.  Continue current medication regimen and adjust as needed.  Lipid panel today. 

## 2022-05-10 NOTE — Assessment & Plan Note (Signed)
BMI 30.26.  Recommended eating smaller high protein, low fat meals more frequently and exercising 30 mins a day 5 times a week with a goal of 10-15lb weight loss in the next 3 months. Patient voiced their understanding and motivation to adhere to these recommendations.

## 2022-05-11 ENCOUNTER — Other Ambulatory Visit: Payer: Self-pay | Admitting: Nurse Practitioner

## 2022-05-11 LAB — COMPREHENSIVE METABOLIC PANEL
ALT: 16 IU/L (ref 0–32)
AST: 22 IU/L (ref 0–40)
Albumin/Globulin Ratio: 1.8 (ref 1.2–2.2)
Albumin: 4.6 g/dL (ref 3.9–4.9)
Alkaline Phosphatase: 85 IU/L (ref 44–121)
BUN/Creatinine Ratio: 17 (ref 12–28)
BUN: 15 mg/dL (ref 8–27)
Bilirubin Total: 0.4 mg/dL (ref 0.0–1.2)
CO2: 24 mmol/L (ref 20–29)
Calcium: 9.4 mg/dL (ref 8.7–10.3)
Chloride: 105 mmol/L (ref 96–106)
Creatinine, Ser: 0.87 mg/dL (ref 0.57–1.00)
Globulin, Total: 2.5 g/dL (ref 1.5–4.5)
Glucose: 83 mg/dL (ref 70–99)
Potassium: 4.4 mmol/L (ref 3.5–5.2)
Sodium: 141 mmol/L (ref 134–144)
Total Protein: 7.1 g/dL (ref 6.0–8.5)
eGFR: 72 mL/min/{1.73_m2} (ref >59)

## 2022-05-11 LAB — LIPID PANEL W/O CHOL/HDL RATIO
Cholesterol, Total: 213 mg/dL — ABNORMAL HIGH (ref 100–199)
HDL: 48 mg/dL (ref >39)
LDL Chol Calc (NIH): 140 mg/dL — ABNORMAL HIGH (ref 0–99)
Triglycerides: 138 mg/dL (ref 0–149)
VLDL Cholesterol Cal: 25 mg/dL (ref 5–40)

## 2022-05-11 LAB — VITAMIN D 25 HYDROXY (VIT D DEFICIENCY, FRACTURES): Vit D, 25-Hydroxy: 19.8 ng/mL — ABNORMAL LOW (ref 30.0–100.0)

## 2022-05-11 MED ORDER — ROSUVASTATIN CALCIUM 40 MG PO TABS: 40.0000 mg | ORAL_TABLET | Freq: Every day | ORAL | 3 refills | Status: DC

## 2022-05-11 NOTE — Progress Notes (Signed)
Contacted via MyChart The 10-year ASCVD risk score (Arnett DK, et al., 2019) is: 20.8%   Values used to calculate the score:     Age: 71 years     Sex: Female     Is Non-Hispanic African American: No     Diabetic: No     Tobacco smoker: Yes     Systolic Blood Pressure: 128 mmHg     Is BP treated: Yes     HDL Cholesterol: 48 mg/dL     Total Cholesterol: 213 mg/dL   Good afternoon Michelle Espinoza, your labs have returned: - Kidney function, creatinine and eGFR, remains normal, as is liver function, AST and ALT.  - Vitamin D level remains low, please ensure to take Vitamin D3 2000 units daily which you obtain over the counter. - Cholesterol labs are still above goal.  I am going to stop your Pravastatin and start Rosuvastatin 40 MG which may lower levels more and help with stroke and heart attack prevention.  I recommend picking this new medication up and stopping Pravastatin.  Continue Zetia with the Rosuvastatin.  Any questions? Keep being amazing!!  Thank you for allowing me to participate in your care.  I appreciate you. Kindest regards, Jolene 

## 2022-06-20 ENCOUNTER — Ambulatory Visit (INDEPENDENT_AMBULATORY_CARE_PROVIDER_SITE_OTHER): Payer: Medicare Other | Admitting: *Deleted

## 2022-06-20 DIAGNOSIS — Z0000 Encounter for general adult medical examination without abnormal findings: Secondary | ICD-10-CM

## 2022-06-20 NOTE — Progress Notes (Signed)
Subjective:   Michelle Espinoza is a 71 y.o. female who presents for Medicare Annual (Subsequent) preventive examination.  I connected with  Michelle Espinoza on 06/20/22 by a telephone enabled telemedicine application and verified that I am speaking with the correct person using two identifiers.   I discussed the limitations of evaluation and management by telemedicine. The patient expressed understanding and agreed to proceed.  Patient location: home  Provider location: tele-health-home    Review of Systems     Cardiac Risk Factors include: advanced age (>62mn, >>47women);hypertension;smoking/ tobacco exposure;sedentary lifestyle     Objective:    Today's Vitals   There is no height or weight on file to calculate BMI.     06/20/2022    1:01 PM 06/01/2021   12:18 PM 05/31/2020    1:02 PM 01/23/2019    8:57 AM 07/01/2018    9:30 PM 01/18/2018    9:05 AM  Advanced Directives  Does Patient Have a Medical Advance Directive? No No No No No No  Would patient like information on creating a medical advance directive? No - Patient declined No - Patient declined   No - Patient declined Yes (MAU/Ambulatory/Procedural Areas - Information given)    Current Medications (verified) Outpatient Encounter Medications as of 06/20/2022  Medication Sig   aspirin EC 81 MG tablet Take 81 mg by mouth daily.   buPROPion (WELLBUTRIN SR) 100 MG 12 hr tablet Take 100 mg by mouth in the morning and at bedtime.   ezetimibe (ZETIA) 10 MG tablet Take 1 tablet (10 mg total) by mouth daily.   fexofenadine (ALLEGRA) 180 MG tablet Take 180 mg by mouth daily.   FLUoxetine (PROZAC) 20 MG capsule Take 20 mg by mouth daily.   levothyroxine (SYNTHROID) 25 MCG tablet Take 1 tablet (25 mcg total) by mouth daily before breakfast.   losartan-hydrochlorothiazide (HYZAAR) 100-12.5 MG tablet Take 1 tablet by mouth daily.   Magnesium Hydroxide (MAGNESIA PO) Take by mouth.   Multiple Vitamin (MULTIVITAMIN) tablet Take  1 tablet by mouth daily.   Multiple Vitamins-Minerals (VITAMIN D3 COMPLETE PO) Take by mouth.   rosuvastatin (CRESTOR) 40 MG tablet Take 1 tablet (40 mg total) by mouth daily.   [facility] VITAMIN B12 1000 MCG tablet Take 1,000 mcg by mouth in the morning and at bedtime. (Patient not taking: Reported on 05/10/2022)   No facility-administered encounter medications on file as of 06/20/2022.    Allergies (verified) Lovastatin and Penicillins   History: Past Medical History:  Diagnosis Date   Allergy    Depression    Essential hypertension    Hyperlipidemia    Restless legs    Past Surgical History:  Procedure Laterality Date   ABDOMINAL HYSTERECTOMY     Family History  Adopted: Yes  Problem Relation Age of Onset   Breast cancer Neg Hx    Social History   Socioeconomic History   Marital status: Widowed    Spouse name: Not on file   Number of children: Not on file   Years of education: 12   Highest education level: High school graduate  Occupational History   Occupation: retired  Tobacco Use   Smoking status: Every Day    Packs/day: 0.50    Years: 49.00    Total pack years: 24.50    Types: Cigarettes   Smokeless tobacco: Never  Vaping Use   Vaping Use: Never used  Substance and Sexual Activity   Alcohol use: Yes    Comment: occasionally  Drug use: No   Sexual activity: Not on file  Other Topics Concern   Not on file  Social History Narrative   Not on file   Social Determinants of Health   Financial Resource Strain: Low Risk  (06/20/2022)   Overall Financial Resource Strain (CARDIA)    Difficulty of Paying Living Expenses: Not hard at all  Food Insecurity: No Food Insecurity (06/20/2022)   Hunger Vital Sign    Worried About Running Out of Food in the Last Year: Never true    Ran Out of Food in the Last Year: Never true  Transportation Needs: No Transportation Needs (06/20/2022)   PRAPARE - Hydrologist (Medical): No    Lack of  Transportation (Non-Medical): No  Physical Activity: Inactive (06/20/2022)   Exercise Vital Sign    Days of Exercise per Week: 0 days    Minutes of Exercise per Session: 0 min  Stress: No Stress Concern Present (06/20/2022)   [place]    Feeling of Stress : Not at all  Social Connections: Socially Isolated (06/20/2022)   Social Connection and Isolation Panel [NHANES]    Frequency of Communication with Friends and Family: More than three times a week    Frequency of Social Gatherings with Friends and Family: More than three times a week    Attends Religious Services: Never    Marine scientist or Organizations: No    Attends Archivist Meetings: Never    Marital Status: Widowed    Tobacco Counseling Ready to quit: Not Answered Counseling given: Not Answered   Clinical Intake:  Pre-visit preparation completed: Yes  Pain : No/denies pain     Diabetes: No  How often do you need to have someone help you when you read instructions, pamphlets, or other written materials from your doctor or pharmacy?: 1 - Never  Diabetic?  no  Interpreter Needed?: No  Information entered by :: Leroy Kennedy LPN   Activities of Daily Living    06/20/2022    1:16 PM  In your present state of health, do you have any difficulty performing the following activities:  Hearing? 0  Vision? 0  Difficulty concentrating or making decisions? 0  Walking or climbing stairs? 1  Dressing or bathing? 0  Doing errands, shopping? 0  Preparing Food and eating ? N  Using the Toilet? N  In the past six months, have you accidently leaked urine? N  Do you have problems with loss of bowel control? N  Managing your Medications? N  Managing your Finances? N  Housekeeping or managing your Housekeeping? N    Patient Care Team: Venita Lick, NP as PCP - General (Nurse Practitioner) Chauncey Mann, MD as Referring Physician  (Psychiatry)  Indicate any recent Medical Services you may have received from other than [facility] providers in the past year (date may be approximate).     Assessment:   This is a routine wellness examination for Michelle Espinoza.  Hearing/Vision screen Hearing Screening - Comments:: No trouble hearing Vision Screening - Comments:: Up to date [facility]   Dietary issues and exercise activities discussed: Current Exercise Habits: The patient does not participate in regular exercise at present   Goals Addressed             This Visit's Progress    Increase physical activity         Depression Screen    06/20/2022  1:08 PM 05/10/2022   10:14 AM 12/06/2021    1:18 PM 11/21/2021    2:33 PM 11/07/2021    1:16 PM 06/01/2021   12:18 PM 05/31/2020    1:03 PM  PHQ 2/9 Scores  PHQ - 2 Score 0 0 0 0 0 0 0  PHQ- 9 Score 0 0 0 0 0  0    Fall Risk    06/20/2022    1:01 PM 05/10/2022   10:14 AM 11/21/2021    2:32 PM 11/07/2021    1:16 PM 06/01/2021   12:04 PM  [place] in the past year? 0 0 0 0 0  Number falls in past yr: 0 0 0 0 0  Injury with Fall? 0 0 0 0 0  Risk for fall due to :  No Fall Risks No Fall Risks No Fall Risks   Follow up Falls evaluation completed;Education provided;Falls prevention discussed Falls evaluation completed Falls evaluation completed Falls evaluation completed Falls evaluation completed;Falls prevention discussed    FALL RISK PREVENTION PERTAINING TO THE HOME:  Any stairs in or around the home? No  If so, are there any without handrails? No  Home free of loose throw rugs in walkways, pet beds, electrical cords, etc? Yes  Adequate lighting in your home to reduce risk of falls? Yes   ASSISTIVE DEVICES UTILIZED TO PREVENT FALLS:  Life alert? No  Use of a cane, walker or w/c? No  Grab bars in the bathroom? No  Shower chair or bench in shower? No  Elevated toilet seat or a handicapped toilet? Yes   TIMED UP AND GO:  Was the test performed? No .     Cognitive Function:        06/20/2022    1:05 PM 05/31/2020    1:05 PM 01/23/2019    8:57 AM 01/18/2018    9:06 AM  6CIT Screen  What Year? 0 points 0 points 0 points 0 points  What month? 0 points 0 points 0 points 0 points  What time? 0 points 0 points 0 points 0 points  Count back from 20 0 points 0 points 0 points 0 points  Months in reverse 0 points 0 points 0 points 0 points  Repeat phrase 0 points 0 points 0 points 0 points  Total Score 0 points 0 points 0 points 0 points    Immunizations Immunization History  Administered Date(s) Administered   Fluad Quad(high Dose 65+) 05/09/2019, 06/28/2020, 05/10/2022   Influenza, High Dose Seasonal PF 06/11/2017   Influenza,inj,Quad PF,6+ Mos 06/17/2015, 05/29/2016, 03/07/2018   Moderna SARS-COV2 Booster Vaccination 07/07/2020   Moderna Sars-Covid-2 Vaccination 09/08/2019, 10/07/2019   Pneumococcal Conjugate-13 06/11/2018   Pneumococcal Polysaccharide-23 06/11/2017   Td 12/13/2006, 01/09/2017   Zoster Recombinat (Shingrix) 03/13/2018   Zoster, Live 07/07/2013    TDAP status: Up to date  Flu Vaccine status: Up to date  Pneumococcal vaccine status: Up to date  Covid-19 vaccine status: Information provided on how to obtain vaccines.   Qualifies for Shingles Vaccine? Yes   Zostavax completed Yes   Shingrix Completed?: No.    Education has been provided regarding the importance of this vaccine. Patient has been advised to call insurance company to determine out of pocket expense if they have not yet received this vaccine. Advised may also receive vaccine at local pharmacy or Health Dept. Verbalized acceptance and understanding.  Screening Tests Health Maintenance  Topic Date Due   COVID-19 Vaccine (4 - 2023-24 season)  07/06/2022 (Originally 03/10/2022)   Zoster Vaccines- Shingrix (2 of 2) 08/10/2022 (Originally 05/08/2018)   Lung Cancer Screening  11/19/2022   Fecal DNA (Cologuard)  04/20/2023   Medicare Annual Wellness  (AWV)  06/21/2023   MAMMOGRAM  01/24/2024   DTaP/Tdap/Td (3 - Tdap) 01/10/2027   DEXA SCAN  04/10/2027   Pneumonia Vaccine 18+ Years old  Completed   INFLUENZA VACCINE  Completed   Hepatitis C Screening  Completed   HPV VACCINES  Aged Out    Health Maintenance  There are no preventive care reminders to display for this patient.   Colorectal cancer screening: Type of screening: Cologuard. Completed 2021. Repeat every 3 years  Mammogram status: Completed  . Repeat every year  Bone Density status: Completed 2018. Results reflect: Bone density results: NORMAL. Repeat every 10 years.  Lung Cancer Screening: (Low Dose CT Chest recommended if Age 15-80 years, 30 pack-year currently smoking OR have quit w/in 15years.) does qualify.   Lung Cancer Screening Referral: ordered  Additional Screening:  Hepatitis C Screening: does not qualify; Completed 2017  Vision Screening: Recommended annual ophthalmology exams for early detection of glaucoma and other disorders of the eye. Is the patient up to date with their annual eye exam?  Yes  Who is the provider or what is the name of the office in which the patient attends annual eye exams? [facility] If pt is not established with a provider, would they like to be referred to a provider to establish care? No .   Dental Screening: Recommended annual dental exams for proper oral hygiene  Community Resource Referral / Chronic Care Management: CRR required this visit?  No   CCM required this visit?  No      Plan:     I have personally reviewed and noted the following in the patient's chart:   Medical and social history Use of alcohol, tobacco or illicit drugs  Current medications and supplements including opioid prescriptions. Patient is not currently taking opioid prescriptions. Functional ability and status Nutritional status Physical activity Advanced directives List of other physicians Hospitalizations, surgeries, and ER visits  in previous 12 months Vitals Screenings to include cognitive, depression, and falls Referrals and appointments  In addition, I have reviewed and discussed with patient certain preventive protocols, quality metrics, and best practice recommendations. A written personalized care plan for preventive services as well as general preventive health recommendations were provided to patient.     Leroy Kennedy, LPN   [id number]   Nurse Notes:

## 2022-06-20 NOTE — Patient Instructions (Signed)
Michelle Espinoza , Thank you for taking time to come for your Medicare Wellness Visit. I appreciate your ongoing commitment to your health goals. Please review the following plan we discussed and let me know if I can assist you in the future.   These are the goals we discussed:  Goals      Increase physical activity     Patient Stated     05/31/2020, to keep weight from increasing     Patient Stated     Would like to get more active     Quit Smoking     Smoking cessation discussed        This is a list of the screening recommended for you and due dates:  Health Maintenance  Topic Date Due   COVID-19 Vaccine (4 - 2023-24 season) 07/06/2022*   Zoster (Shingles) Vaccine (2 of 2) 08/10/2022*   Screening for Lung Cancer  11/19/2022   Cologuard (Stool DNA test)  04/20/2023   Medicare Annual Wellness Visit  06/21/2023   Mammogram  01/24/2024   DTaP/Tdap/Td vaccine (3 - Tdap) 01/10/2027   DEXA scan (bone density measurement)  04/10/2027   Pneumonia Vaccine  Completed   Flu Shot  Completed   Hepatitis C Screening: USPSTF Recommendation to screen - Ages 18-79 yo.  Completed   HPV Vaccine  Aged Out  *Topic was postponed. The date shown is not the original due date.    Advanced directives: Education provided    Preventive Care 49 Years and Older, Female Preventive care refers to lifestyle choices and visits with your health care provider that can promote health and wellness. What does preventive care include? A yearly physical exam. This is also called an annual well check. Dental exams once or twice a year. Routine eye exams. Ask your health care provider how often you should have your eyes checked. Personal lifestyle choices, including: Daily care of your teeth and gums. Regular physical activity. Eating a healthy diet. Avoiding tobacco and drug use. Limiting alcohol use. Practicing safe sex. Taking low-dose aspirin every day. Taking vitamin and mineral supplements as  recommended by your health care provider. What happens during an annual well check? The services and screenings done by your health care provider during your annual well check will depend on your age, overall health, lifestyle risk factors, and family history of disease. Counseling  Your health care provider may ask you questions about your: Alcohol use. Tobacco use. Drug use. Emotional well-being. Home and relationship well-being. Sexual activity. Eating habits. History of falls. Memory and ability to understand (cognition). Work and work Statistician. Reproductive health. Screening  You may have the following tests or measurements: Height, weight, and BMI. Blood pressure. Lipid and cholesterol levels. These may be checked every 5 years, or more frequently if you are over 32 years old. Skin check. Lung cancer screening. You may have this screening every year starting at age 63 if you have a 30-pack-year history of smoking and currently smoke or have quit within the past 15 years. Fecal occult blood test (FOBT) of the stool. You may have this test every year starting at age 65. Flexible sigmoidoscopy or colonoscopy. You may have a sigmoidoscopy every 5 years or a colonoscopy every 10 years starting at age 47. Hepatitis C blood test. Hepatitis B blood test. Sexually transmitted disease (STD) testing. Diabetes screening. This is done by checking your blood sugar (glucose) after you have not eaten for a while (fasting). You may have this done every 1-3  years. Bone density scan. This is done to screen for osteoporosis. You may have this done starting at age 25. Mammogram. This may be done every 1-2 years. Talk to your health care provider about how often you should have regular mammograms. Talk with your health care provider about your test results, treatment options, and if necessary, the need for more tests. Vaccines  Your health care provider may recommend certain vaccines, such  as: Influenza vaccine. This is recommended every year. Tetanus, diphtheria, and acellular pertussis (Tdap, Td) vaccine. You may need a Td booster every 10 years. Zoster vaccine. You may need this after age 30. Pneumococcal 13-valent conjugate (PCV13) vaccine. One dose is recommended after age 73. Pneumococcal polysaccharide (PPSV23) vaccine. One dose is recommended after age 54. Talk to your health care provider about which screenings and vaccines you need and how often you need them. This information is not intended to replace advice given to you by your health care provider. Make sure you discuss any questions you have with your health care provider. Document Released: 07/23/2015 Document Revised: 03/15/2016 Document Reviewed: 04/27/2015 Elsevier Interactive Patient Education  2017 [place] Prevention in the Home Falls can cause injuries. They can happen to people of all ages. There are many things you can do to make your home safe and to help prevent falls. What can I do on the outside of my home? Regularly fix the edges of walkways and driveways and fix any cracks. Remove anything that might make you trip as you walk through a door, such as a raised step or threshold. Trim any bushes or trees on the path to your home. Use bright outdoor lighting. Clear any walking paths of anything that might make someone trip, such as rocks or tools. Regularly check to see if handrails are loose or broken. Make sure that both sides of any steps have handrails. Any raised decks and porches should have guardrails on the edges. Have any leaves, snow, or ice cleared regularly. Use sand or salt on walking paths during winter. Clean up any spills in your garage right away. This includes oil or grease spills. What can I do in the bathroom? Use night lights. Install grab bars by the toilet and in the tub and shower. Do not use towel bars as grab bars. Use non-skid mats or decals in the tub or  shower. If you need to sit down in the shower, use a plastic, non-slip stool. Keep the floor dry. Clean up any water that spills on the floor as soon as it happens. Remove soap buildup in the tub or shower regularly. Attach bath mats securely with double-sided non-slip rug tape. Do not have throw rugs and other things on the floor that can make you trip. What can I do in the bedroom? Use night lights. Make sure that you have a light by your bed that is easy to reach. Do not use any sheets or blankets that are too big for your bed. They should not hang down onto the floor. Have a firm chair that has side arms. You can use this for support while you get dressed. Do not have throw rugs and other things on the floor that can make you trip. What can I do in the kitchen? Clean up any spills right away. Avoid walking on wet floors. Keep items that you use a lot in easy-to-reach places. If you need to reach something above you, use a strong step stool that has a grab  bar. Keep electrical cords out of the way. Do not use floor polish or wax that makes floors slippery. If you must use wax, use non-skid floor wax. Do not have throw rugs and other things on the floor that can make you trip. What can I do with my stairs? Do not leave any items on the stairs. Make sure that there are handrails on both sides of the stairs and use them. Fix handrails that are broken or loose. Make sure that handrails are as long as the stairways. Check any carpeting to make sure that it is firmly attached to the stairs. Fix any carpet that is loose or worn. Avoid having throw rugs at the top or bottom of the stairs. If you do have throw rugs, attach them to the floor with carpet tape. Make sure that you have a light switch at the top of the stairs and the bottom of the stairs. If you do not have them, ask someone to add them for you. What else can I do to help prevent falls? Wear shoes that: Do not have high heels. Have  rubber bottoms. Are comfortable and fit you well. Are closed at the toe. Do not wear sandals. If you use a stepladder: Make sure that it is fully opened. Do not climb a closed stepladder. Make sure that both sides of the stepladder are locked into place. Ask someone to hold it for you, if possible. Clearly mark and make sure that you can see: Any grab bars or handrails. First and last steps. Where the edge of each step is. Use tools that help you move around (mobility aids) if they are needed. These include: Canes. Walkers. Scooters. Crutches. Turn on the lights when you go into a dark area. Replace any light bulbs as soon as they burn out. Set up your furniture so you have a clear path. Avoid moving your furniture around. If any of your floors are uneven, fix them. If there are any pets around you, be aware of where they are. Review your medicines with your doctor. Some medicines can make you feel dizzy. This can increase your chance of falling. Ask your doctor what other things that you can do to help prevent falls. This information is not intended to replace advice given to you by your health care provider. Make sure you discuss any questions you have with your health care provider. Document Released: 04/22/2009 Document Revised: 12/02/2015 Document Reviewed: 07/31/2014 Elsevier Interactive Patient Education  2017 [employer].

## 2022-11-05 NOTE — Patient Instructions (Signed)
Be Involved in Your Health Care:  Taking Medications When medications are taken as directed, they can greatly improve your health. But if they are not taken as instructed, they may not work. In some cases, not taking them correctly can be harmful. To help ensure your treatment remains effective and safe, understand your medications and how to take them.  Your lab results, notes and after visit summary will be available on My Chart. We strongly encourage you to use this feature. If lab results are abnormal the clinic will contact you with the appropriate steps. If the clinic does not contact you assume the results are satisfactory. You can always see your results on My Chart. If you have questions regarding your condition, please contact the clinic during office hours. You can also ask questions on My Chart.  We at [facility] are grateful that you chose us to provide care. We strive to provide excellent and compassionate care and are always looking for feedback. If you get a survey from the clinic please complete this.    Living with COPD Being diagnosed with chronic obstructive pulmonary disease (COPD) changes your life physically and emotionally. Having COPD can affect your ability to work and do things you enjoy. COPD is not the same for everyone, and it may change over time. Your health care providers can help you come up with the COPD management plan that works best for you. How to manage lifestyle changes Treatment plan Work closely with your health care providers. Follow your COPD management plan. This plan includes: Instructions about activities, exercises, diet, medicines, what to do when COPD flares up, and when to call your health care provider. A pulmonary rehabilitation program. In pulmonary rehab, you will learn about COPD, do exercises for fitness and breathing, and get support from health care providers and other people who have COPD. Managing emotions and  stress Living with a chronic disease means you may also struggle with stressful emotions, such as sadness, fear, and worry. Here are some ways to manage these emotions: Talk to someone about your fear, anxiety, depression, or stress. Learn strategies to avoid or reduce stress and ask for help if you are struggling with depression or anxiety. Consider joining a COPD support group, online or in person.  Adjusting to changes COPD may limit the things you can do, but you can make certain changes to help you cope with the diagnosis. Ask for help when you need it. Getting support from friends, family, and your health care team is an important part of managing the condition. Try to get regular exercise as prescribed by a health care provider or pulmonary rehab team. Exercising can help COPD, even if you are a bit short of breath. Take steps to prevent infection and protect your lungs: Wash your hands often and avoid being in crowds. Stay away from friends and family members who are sick. Check your local air quality each day, and stay out of areas where air pollution is likely. How to recognize changes in your condition Recognizing changes in your COPD COPD is a progressive disease. It is important to let the health care team know if your COPD is getting worse. Your treatment plan may need to change. Watch for: Increased shortness of breath, wheezing, cough, or fatigue. Loss of ability to exercise or perform daily activities, like climbing stairs. More frequent symptom flares. Signs of depression or anxiety. Recognizing stress It is normal to have additional stress when you have COPD. However, prolonged   stress and anxiety can make COPD worse and lead to depression. Recognize the warning signs, which include: Feeling sad or worried more often or most of the time. Having less energy and losing interest in pleasurable activities. Changes in your appetite or sleeping patterns. Being easily angered or  irritated. Having unexplained aches and pains, digestive problems, or headaches. Follow these instructions at home: Eating and drinking  Eat foods that are high in fiber, such as fresh fruits and vegetables, whole grains, and beans. Limit foods that are high in fat and processed sugars, such as fried or sweet foods. Follow a balanced diet and maintain a healthy weight. Being overweight or underweight can make COPD worse. You may work with a dietitian as part of your pulmonary rehab program. Drink enough fluid to keep your urine pale yellow. If you drink alcohol: Limit how much you have to: 0-1 drink a day for women who are not pregnant. 0-2 drinks a day for men. Know how much alcohol is in your drink. In the [place] one drink equals one 12 oz bottle of beer (355 mL), one 5 oz glass of wine (148 mL), or one 1 oz glass of hard liquor (44 mL). Lifestyle If you smoke, the most important thing that you can do is to stop smoking. Continuing to smoke will cause the disease to progress faster. Do not use any products that contain nicotine or tobacco. These products include cigarettes, chewing tobacco, and vaping devices, such as e-cigarettes. If you need help quitting, ask your health care provider. Avoid exposure to things that irritate your lungs, such as smoke, chemicals, and fumes. Activity Balance exercise and rest. Take short walks every 1-2 hours. This is important to improve blood flow and breathing. Ask for help if you feel weak or unsteady. Do exercises that include controlled breathing with body movement, such as tai chi. General instructions Take over-the-counter and prescription medicines only as told by your health care provider. Take vitamin and protein supplements as told by your health care provider or dietitian. Practice good oral hygiene and see your dental care provider regularly. An oral infection can also spread to your lungs. Make sure you receive all the vaccines that your  health care provider recommends. Keep all follow-up visits. This is important. Contact a health care provider if you: Are struggling to manage your COPD. Have emotional stress that interferes with your ability to cope with COPD. Get help right away if you: Have thoughts of suicide, death, or hurting yourself or others. If you ever feel like you may hurt yourself or others, or have thoughts about taking your own life, get help right away. Go to your nearest emergency department or: Call your local emergency services (911 in the [place] Call a suicide crisis helpline, such as the National Suicide Prevention Lifeline at 1-800-273-8255 or 988 in the [place] This is open 24 hours a day in the [place] Text the Crisis Text Line at 741741 (in the [place] Summary Being diagnosed with chronic obstructive pulmonary disease (COPD) changes your life physically and emotionally. Work with your health care providers and follow your COPD management plan. A pulmonary rehabilitation program is an important part of COPD management. Prolonged stress, anxiety, and depression can make COPD worse. Let your health care provider know if emotional stress interferes with your ability to cope with and manage COPD. This information is not intended to replace advice given to you by your health care provider. Make sure you discuss any questions you have with   your health care provider. Document Revised: 01/19/2021 Document Reviewed: 07/14/2020 Elsevier Patient Education  2023 [employer]  

## 2022-11-08 ENCOUNTER — Ambulatory Visit (INDEPENDENT_AMBULATORY_CARE_PROVIDER_SITE_OTHER): Payer: Medicare Other | Admitting: Nurse Practitioner

## 2022-11-08 ENCOUNTER — Encounter: Payer: Self-pay | Admitting: Nurse Practitioner

## 2022-11-08 VITALS — BP 132/89 | HR 78 | Temp 98.0°F | Ht 62.99 in | Wt 170.8 lb

## 2022-11-08 DIAGNOSIS — G2581 Restless legs syndrome: Secondary | ICD-10-CM

## 2022-11-08 DIAGNOSIS — E063 Autoimmune thyroiditis: Secondary | ICD-10-CM | POA: Diagnosis not present

## 2022-11-08 DIAGNOSIS — Z0000 Encounter for general adult medical examination without abnormal findings: Secondary | ICD-10-CM | POA: Diagnosis not present

## 2022-11-08 DIAGNOSIS — F3342 Major depressive disorder, recurrent, in full remission: Secondary | ICD-10-CM | POA: Diagnosis not present

## 2022-11-08 DIAGNOSIS — I700 Atherosclerosis of aorta: Secondary | ICD-10-CM | POA: Diagnosis not present

## 2022-11-08 DIAGNOSIS — I10 Essential (primary) hypertension: Secondary | ICD-10-CM | POA: Diagnosis not present

## 2022-11-08 DIAGNOSIS — Z6830 Body mass index (BMI) 30.0-30.9, adult: Secondary | ICD-10-CM

## 2022-11-08 DIAGNOSIS — J432 Centrilobular emphysema: Secondary | ICD-10-CM | POA: Diagnosis not present

## 2022-11-08 DIAGNOSIS — E559 Vitamin D deficiency, unspecified: Secondary | ICD-10-CM

## 2022-11-08 DIAGNOSIS — E782 Mixed hyperlipidemia: Secondary | ICD-10-CM | POA: Diagnosis not present

## 2022-11-08 DIAGNOSIS — F17210 Nicotine dependence, cigarettes, uncomplicated: Secondary | ICD-10-CM

## 2022-11-08 DIAGNOSIS — E6609 Other obesity due to excess calories: Secondary | ICD-10-CM

## 2022-11-08 MED ORDER — EZETIMIBE 10 MG PO TABS: 10.0000 mg | ORAL_TABLET | Freq: Every day | ORAL | 4 refills | Status: DC

## 2022-11-08 MED ORDER — LEVOTHYROXINE SODIUM 25 MCG PO TABS: 25.0000 ug | ORAL_TABLET | Freq: Every day | ORAL | 4 refills | Status: DC

## 2022-11-08 MED ORDER — ROSUVASTATIN CALCIUM 40 MG PO TABS: 40.0000 mg | ORAL_TABLET | Freq: Every day | ORAL | 4 refills | Status: DC

## 2022-11-08 MED ORDER — LOSARTAN POTASSIUM-HCTZ 100-12.5 MG PO TABS: 1.0000 | ORAL_TABLET | Freq: Every day | ORAL | 4 refills | Status: DC

## 2022-11-08 NOTE — Assessment & Plan Note (Signed)
I have recommended complete cessation of tobacco use. I have discussed various options available for assistance with tobacco cessation including over the counter methods (Nicotine gum, patch and lozenges). We also discussed prescription options (Chantix, Nicotine Inhaler / Nasal Spray). The patient is not interested in pursuing any prescription tobacco cessation options at this time.  Continue annual screening.  

## 2022-11-08 NOTE — Assessment & Plan Note (Signed)
BMI 30.26.  Recommended eating smaller high protein, low fat meals more frequently and exercising 30 mins a day 5 times a week with a goal of 10-15lb weight loss in the next 3 months. Patient voiced their understanding and motivation to adhere to these recommendations.  

## 2022-11-08 NOTE — Assessment & Plan Note (Addendum)
Chronic, ongoing.  Initial BP elevated due to recent cigarette, but repeat trending down to goal.  Recommend she monitor BP at least a few mornings a week at home and document.  DASH diet at home.  Continue current medication regimen and adjust as needed.  Labs today: CMP, CBC, TSH.  Return in 6 months.

## 2022-11-08 NOTE — Assessment & Plan Note (Signed)
Chronic.  Noted on CT lung screening -- at this time recommend complete cessation of smoking and continue statin therapy for prevention. 

## 2022-11-08 NOTE — Assessment & Plan Note (Signed)
Chronic, stable.  Continue current medication regimen and adjust as needed.  Lipid panel today. 

## 2022-11-08 NOTE — Assessment & Plan Note (Signed)
Chronic.  Noted past labs, recommend she continue supplement and adjust as needed.  Check level today.

## 2022-11-08 NOTE — Assessment & Plan Note (Signed)
Chronic, stable.  Denies SI/HI.  Continue current medication regimen as prescribed by psychiatry and collaboration with psychiatry.  Recent notes reviewed with patient.  Will take over regimen at this time as can no longer see psychiatry due to insurance coverage.

## 2022-11-08 NOTE — Progress Notes (Signed)
BP 132/89   Pulse 78   Temp 98 F (36.7 C) (Oral)   Ht 5' 2.99" (1.6 m)   Wt 170 lb 12.8 oz (77.5 kg)   SpO2 97%   BMI 30.26 kg/m    Subjective:    Patient ID: Michelle Espinoza, female    DOB: 09/19/1950, 72 y.o.   MRN: [id number]  HPI: Michelle Espinoza is a 72 y.o. female presenting on 11/08/2022 for comprehensive medical examination. Current medical complaints include:none  She currently lives with: self Menopausal Symptoms: no -- had hysterectomy, but still has ovaries  HYPERTENSION / HYPERLIPIDEMIA Taking Losartan-HCTZ 100-12.5 daily and Zetia + Rosuvastatin -- occasionally misses doses.   Satisfied with current treatment? yes Duration of hypertension: chronic BP monitoring frequency: not checking BP range:  BP medication side effects: no Duration of hyperlipidemia: chronic Cholesterol medication side effects: no Cholesterol supplements: none Medication compliance: fair compliance Aspirin: yes Recent stressors: no Recurrent headaches: no Visual changes: no Palpitations: no Dyspnea: no Chest pain: no Lower extremity edema: no Dizzy/lightheaded: no  The 10-year ASCVD risk score (Arnett DK, et al., 2019) is: 21.9%   Values used to calculate the score:     Age: 52 years     Sex: Female     Is Non-Hispanic African American: No     Diabetic: No     Tobacco smoker: Yes     Systolic Blood Pressure: 132 mmHg     Is BP treated: Yes     HDL Cholesterol: 48 mg/dL     Total Cholesterol: 213 mg/dL  COPD No current inhalers.  Current smoker, smokes 1/2 PPD -- occasionally a little more.  Has smoked since she was 18-18.  Last lung screening in May 2023 noted centrilobular emphysema and aortic atherosclerosis + cholelithiasis.   COPD status: stable Satisfied with current treatment?: yes Oxygen use: no Dyspnea frequency: no Cough frequency: occasional Rescue inhaler frequency: none Limitation of activity: no Productive cough: none Last Spirometry: 11/08/2022 Pneumovax: Up  to Date Influenza: Up to Date   HYPOTHYROIDISM Continues on Levothyroxine 25 MCG daily. Thyroid control status: controlled Satisfied with current treatment? yes Medication side effects: no Medication compliance: fair compliance Etiology of hypothyroidism:  Recent dose adjustment:no Fatigue: no Cold intolerance: no Heat intolerance: no Weight gain: no Weight loss: no Constipation: no Diarrhea/loose stools: no Palpitations: no Lower extremity edema: no Anxiety/depressed mood: no   DEPRESSION Currently taking Wellbutrin 100 MG BID (often takes once a day) and Prozac 20 MG daily.  Followed by Dr. Maryruth Bun, last saw 08/27/22, however her insurance is no longer covering provider and she requests PCP take over medications for now. Mood status: stable Satisfied with current treatment?: yes Symptom severity: moderate  Duration of current treatment : chronic Side effects: no Medication compliance: good compliance Psychotherapy/counseling: yes current Depressed mood: no Anxious mood: no Anhedonia: no Significant weight loss or gain: no Insomnia: no Fatigue: no Feelings of worthlessness or guilt: no Impaired concentration/indecisiveness: no Suicidal ideations: no Hopelessness: no Crying spells: no    11/08/2022   10:46 AM 06/20/2022    1:08 PM 05/10/2022   10:14 AM 12/06/2021    1:18 PM 11/21/2021    2:33 PM  Depression screen PHQ 2/9  Decreased Interest 0 0 0 0 0  Down, Depressed, Hopeless 0 0 0 0 0  PHQ - 2 Score 0 0 0 0 0  Altered sleeping 0 0 0 0 0  Tired, decreased energy 0 0 0 0 0  Change  in appetite 0 0 0 0 0  Feeling bad or failure about yourself  0 0 0 0 0  Trouble concentrating 0 0 0 0 0  Moving slowly or fidgety/restless 0 0 0 0 0  Suicidal thoughts 0 0 0 0 0  PHQ-9 Score 0 0 0 0 0  Difficult doing work/chores Not difficult at all Not difficult at all Not difficult at all         11/08/2022   10:47 AM 05/10/2022   10:14 AM 12/06/2021    1:18 PM 11/21/2021    2:33  PM  GAD 7 : Generalized Anxiety Score  Nervous, Anxious, on Edge 0 0 0 0  Control/stop worrying 0 0 0 0  Worry too much - different things 0 0 0 0  Trouble relaxing 0 0 0 0  Restless 0 0 0 0  Easily annoyed or irritable 0 0 0 0  Afraid - awful might happen 0 0 0 0  Total GAD 7 Score 0 0 0 0  Anxiety Difficulty Not difficult at all Not difficult at all  Not difficult at all        06/01/2021   12:04 PM 11/07/2021    1:16 PM 11/21/2021    2:32 PM 05/10/2022   10:14 AM 06/20/2022    1:01 PM  Fall Risk  Falls in the past year? 0 0 0 0 0  Was there an injury with Fall? 0 0 0 0 0  Fall Risk Category Calculator 0 0 0 0 0  Fall Risk Category (Retired) Low Low Low Low Low  (RETIRED) Patient Fall Risk Level Low fall risk Low fall risk Low fall risk Low fall risk Low fall risk  Patient at Risk for Falls Due to  No Fall Risks No Fall Risks No Fall Risks   Fall risk Follow up Falls evaluation completed;Falls prevention discussed Falls evaluation completed Falls evaluation completed Falls evaluation completed Falls evaluation completed;Education provided;Falls prevention discussed    Functional Status Survey: Is the patient deaf or have difficulty hearing?: No Does the patient have difficulty seeing, even when wearing glasses/contacts?: No Does the patient have difficulty concentrating, remembering, or making decisions?: No Does the patient have difficulty walking or climbing stairs?: No Does the patient have difficulty dressing or bathing?: No Does the patient have difficulty doing errands alone such as visiting a doctor's office or shopping?: No    Past Medical History:  Past Medical History:  Diagnosis Date   Allergy    Depression    Essential hypertension    Hyperlipidemia    Restless legs     Surgical History:  Past Surgical History:  Procedure Laterality Date   ABDOMINAL HYSTERECTOMY      Medications:  Current Outpatient Medications on File Prior to Visit  Medication Sig    loratadine (CLARITIN REDITABS) 10 MG dissolvable tablet Take 10 mg by mouth daily.   aspirin EC 81 MG tablet Take 81 mg by mouth daily.   buPROPion (WELLBUTRIN SR) 100 MG 12 hr tablet Take 100 mg by mouth in the morning and at bedtime.   FLUoxetine (PROZAC) 20 MG capsule Take 20 mg by mouth daily.   Magnesium Hydroxide (MAGNESIA PO) Take by mouth.   Multiple Vitamin (MULTIVITAMIN) tablet Take 1 tablet by mouth daily.   Multiple Vitamins-Minerals (VITAMIN D3 COMPLETE PO) Take by mouth.   No current facility-administered medications on file prior to visit.    Allergies:  Allergies  Allergen Reactions   Lovastatin  Elevated LFT   Penicillins Rash    Social History:  Social History   Socioeconomic History   Marital status: Widowed    Spouse name: Not on file   Number of children: Not on file   Years of education: 12   Highest education level: High school graduate  Occupational History   Occupation: retired  Tobacco Use   Smoking status: Every Day    Packs/day: 0.50    Years: 49.00    Additional pack years: 0.00    Total pack years: 24.50    Types: Cigarettes   Smokeless tobacco: Never  Vaping Use   Vaping Use: Never used  Substance and Sexual Activity   Alcohol use: Yes    Comment: occasionally   Drug use: No   Sexual activity: Not on file  Other Topics Concern   Not on file  Social History Narrative   Not on file   Social Determinants of Health   Financial Resource Strain: Low Risk  (06/20/2022)   Overall Financial Resource Strain (CARDIA)    Difficulty of Paying Living Expenses: Not hard at all  Food Insecurity: No Food Insecurity (06/20/2022)   Hunger Vital Sign    Worried About Running Out of Food in the Last Year: Never true    Ran Out of Food in the Last Year: Never true  Transportation Needs: No Transportation Needs (06/20/2022)   PRAPARE - Administrator, Civil Service (Medical): No    Lack of Transportation (Non-Medical): No   Physical Activity: Inactive (06/20/2022)   Exercise Vital Sign    Days of Exercise per Week: 0 days    Minutes of Exercise per Session: 0 min  Stress: No Stress Concern Present (06/20/2022)   [employer] of Occupational Health - Occupational Stress Questionnaire    Feeling of Stress : Not at all  Social Connections: Socially Isolated (06/20/2022)   Social Connection and Isolation Panel [NHANES]    Frequency of Communication with Friends and Family: More than three times a week    Frequency of Social Gatherings with Friends and Family: More than three times a week    Attends Religious Services: Never    Database administrator or Organizations: No    Attends Banker Meetings: Never    Marital Status: Widowed  Intimate Partner Violence: Not At Risk (06/20/2022)   Humiliation, Afraid, Rape, and Kick questionnaire    Fear of Current or Ex-Partner: No    Emotionally Abused: No    Physically Abused: No    Sexually Abused: No   Social History   Tobacco Use  Smoking Status Every Day   Packs/day: 0.50   Years: 49.00   Additional pack years: 0.00   Total pack years: 24.50   Types: Cigarettes  Smokeless Tobacco Never   Social History   Substance and Sexual Activity  Alcohol Use Yes   Comment: occasionally    Family History:  Family History  Adopted: Yes  Problem Relation Age of Onset   Breast cancer Neg Hx     Past medical history, surgical history, medications, allergies, family history and social history reviewed with patient today and changes made to appropriate areas of the chart.   ROS All other ROS negative except what is listed above and in the HPI.      Objective:    BP 132/89   Pulse 78   Temp 98 F (36.7 C) (Oral)   Ht 5' 2.99" (1.6 m)   Wt  170 lb 12.8 oz (77.5 kg)   SpO2 97%   BMI 30.26 kg/m   Wt Readings from Last 3 Encounters:  11/08/22 170 lb 12.8 oz (77.5 kg)  05/10/22 170 lb 12.8 oz (77.5 kg)  01/16/22 168 lb 12.8 oz (76.6  kg)    Physical Exam Vitals and nursing note reviewed. Exam conducted with a chaperone present.  Constitutional:      General: She is awake. She is not in acute distress.    Appearance: She is well-developed and well-groomed. She is not ill-appearing or toxic-appearing.  HENT:     Head: Normocephalic and atraumatic.     Right Ear: Hearing, tympanic membrane, ear canal and external ear normal. No drainage.     Left Ear: Hearing, tympanic membrane, ear canal and external ear normal. No drainage.     Nose: Nose normal.     Right Sinus: No maxillary sinus tenderness or frontal sinus tenderness.     Left Sinus: No maxillary sinus tenderness or frontal sinus tenderness.     Mouth/Throat:     Mouth: Mucous membranes are moist.     Pharynx: Oropharynx is clear. Uvula midline. No pharyngeal swelling, oropharyngeal exudate or posterior oropharyngeal erythema.  Eyes:     General: Lids are normal.        Right eye: No discharge.        Left eye: No discharge.     Extraocular Movements: Extraocular movements intact.     Conjunctiva/sclera: Conjunctivae normal.     Pupils: Pupils are equal, round, and reactive to light.     Visual Fields: Right eye visual fields normal and left eye visual fields normal.  Neck:     Thyroid: No thyromegaly.     Vascular: No carotid bruit.     Trachea: Trachea normal.  Cardiovascular:     Rate and Rhythm: Normal rate and regular rhythm.     Heart sounds: Normal heart sounds. No murmur heard.    No gallop.  Pulmonary:     Effort: Pulmonary effort is normal. No accessory muscle usage or respiratory distress.     Breath sounds: Normal breath sounds.  Chest:  Breasts:    Right: Normal.     Left: Normal.  Abdominal:     General: Bowel sounds are normal.     Palpations: Abdomen is soft. There is no hepatomegaly or splenomegaly.     Tenderness: There is no abdominal tenderness.  Musculoskeletal:        General: Normal range of motion.     Cervical back: Normal  range of motion and neck supple.     Right lower leg: No edema.     Left lower leg: No edema.  Lymphadenopathy:     Head:     Right side of head: No submental, submandibular, tonsillar, preauricular or posterior auricular adenopathy.     Left side of head: No submental, submandibular, tonsillar, preauricular or posterior auricular adenopathy.     Cervical: No cervical adenopathy.     Upper Body:     Right upper body: No supraclavicular, axillary or pectoral adenopathy.     Left upper body: No supraclavicular, axillary or pectoral adenopathy.  Skin:    General: Skin is warm and dry.     Capillary Refill: Capillary refill takes less than 2 seconds.     Findings: No rash.  Neurological:     Mental Status: She is alert and oriented to person, place, and time.     Gait: Gait  is intact.     Deep Tendon Reflexes: Reflexes are normal and symmetric.     Reflex Scores:      Brachioradialis reflexes are 2+ on the right side and 2+ on the left side.      Patellar reflexes are 2+ on the right side and 2+ on the left side. Psychiatric:        Attention and Perception: Attention normal.        Mood and Affect: Mood normal.        Speech: Speech normal.        Behavior: Behavior normal. Behavior is cooperative.        Thought Content: Thought content normal.        Judgment: Judgment normal.     Results for orders placed or performed in visit on 05/10/22  Comprehensive metabolic panel  Result Value Ref Range   Glucose 83 70 - 99 mg/dL   BUN 15 8 - 27 mg/dL   Creatinine, Ser 1.61 0.57 - 1.00 mg/dL   eGFR 72 >09 UE/AVW/0.98   BUN/Creatinine Ratio 17 12 - 28   Sodium 141 134 - 144 mmol/L   Potassium 4.4 3.5 - 5.2 mmol/L   Chloride 105 96 - 106 mmol/L   CO2 24 20 - 29 mmol/L   Calcium 9.4 8.7 - 10.3 mg/dL   Total Protein 7.1 6.0 - 8.5 g/dL   Albumin 4.6 3.9 - 4.9 g/dL   Globulin, Total 2.5 1.5 - 4.5 g/dL   Albumin/Globulin Ratio 1.8 1.2 - 2.2   Bilirubin Total 0.4 0.0 - 1.2 mg/dL    Alkaline Phosphatase 85 44 - 121 IU/L   AST 22 0 - 40 IU/L   ALT 16 0 - 32 IU/L  Lipid Panel w/o Chol/HDL Ratio  Result Value Ref Range   Cholesterol, Total 213 (H) 100 - 199 mg/dL   Triglycerides 119 0 - 149 mg/dL   HDL 48 >14 mg/dL   VLDL Cholesterol Cal 25 5 - 40 mg/dL   LDL Chol Calc (NIH) 782 (H) 0 - 99 mg/dL  VITAMIN D 25 Hydroxy (Vit-D Deficiency, Fractures)  Result Value Ref Range   Vit D, 25-Hydroxy 19.8 (L) 30.0 - 100.0 ng/mL      Assessment & Plan:   Problem List Items Addressed This Visit       Cardiovascular and Mediastinum   Aortic atherosclerosis (HCC)    Chronic.  Noted on CT lung screening -- at this time recommend complete cessation of smoking and continue statin therapy for prevention.      Relevant Medications   losartan-hydrochlorothiazide (HYZAAR) 100-12.5 MG tablet   rosuvastatin (CRESTOR) 40 MG tablet   ezetimibe (ZETIA) 10 MG tablet   Other Relevant Orders   Comprehensive metabolic panel   Lipid Panel w/o Chol/HDL Ratio   Essential hypertension    Chronic, ongoing.  Initial BP elevated due to recent cigarette, but repeat trending down to goal.  Recommend she monitor BP at least a few mornings a week at home and document.  DASH diet at home.  Continue current medication regimen and adjust as needed.  Labs today: CMP, CBC, TSH.  Return in 6 months.        Relevant Medications   losartan-hydrochlorothiazide (HYZAAR) 100-12.5 MG tablet   rosuvastatin (CRESTOR) 40 MG tablet   ezetimibe (ZETIA) 10 MG tablet   Other Relevant Orders   CBC with Differential/Platelet   Comprehensive metabolic panel   TSH     Respiratory   Centrilobular  emphysema (HCC) - Primary    Ongoing in long time smoker and noted on CT lung screening.  Recommend complete cessation of smoking.  No current inhalers and stable. Labs: CBC.  Spirometry with mild restriction noted today, FEV1 91%, FEV1/FVC 65%.  Educated her at length on emphysema.  Start inhalers as needed in future.       Relevant Medications   loratadine (CLARITIN REDITABS) 10 MG dissolvable tablet   Other Relevant Orders   CBC with Differential/Platelet   Spirometry with Graph (Completed)     Endocrine   Hashimoto's thyroiditis    Chronic, ongoing.  Continue Levothyroxine 25 MCG daily.  Continue current medication regimen and adjust as needed.  Labs today, refills sent.      Relevant Medications   levothyroxine (SYNTHROID) 25 MCG tablet   Other Relevant Orders   TSH   T4, free     Other   Hyperlipidemia    Chronic, stable.  Continue current medication regimen and adjust as needed.  Lipid panel today.      Relevant Medications   losartan-hydrochlorothiazide (HYZAAR) 100-12.5 MG tablet   rosuvastatin (CRESTOR) 40 MG tablet   ezetimibe (ZETIA) 10 MG tablet   Other Relevant Orders   Comprehensive metabolic panel   Lipid Panel w/o Chol/HDL Ratio   Nicotine dependence, cigarettes, uncomplicated    I have recommended complete cessation of tobacco use. I have discussed various options available for assistance with tobacco cessation including over the counter methods (Nicotine gum, patch and lozenges). We also discussed prescription options (Chantix, Nicotine Inhaler / Nasal Spray). The patient is not interested in pursuing any prescription tobacco cessation options at this time.  Continue annual screening.       Obesity    BMI 30.26.  Recommended eating smaller high protein, low fat meals more frequently and exercising 30 mins a day 5 times a week with a goal of 10-15lb weight loss in the next 3 months. Patient voiced their understanding and motivation to adhere to these recommendations.       Recurrent major depressive disorder, in full remission (HCC)    Chronic, stable.  Denies SI/HI.  Continue current medication regimen as prescribed by psychiatry and collaboration with psychiatry.  Recent notes reviewed with patient.  Will take over regimen at this time as can no longer see psychiatry due  to insurance coverage.      Vitamin D deficiency    Chronic.  Noted past labs, recommend she continue supplement and adjust as needed.  Check level today.      Relevant Orders   VITAMIN D 25 Hydroxy (Vit-D Deficiency, Fractures)   Other Visit Diagnoses     Encounter for annual physical exam       Annual physical today with labs performed and health maintenance reviewed.        Follow up plan: Return in about 6 months (around 05/11/2023) for MOOD, COPD, HTN/HLD.   LABORATORY TESTING:  - Pap smear: not applicable  IMMUNIZATIONS:   - Tdap: Tetanus vaccination status reviewed: last tetanus booster within 10 years. - Influenza: Up to date - Pneumovax: Up to date - Prevnar: Up to date - COVID: Up to date - HPV: Not applicable - Shingrix vaccine: Up to date on one dose, needs to get 2nd  SCREENING: -Mammogram: Up to date  - Colonoscopy: Up to date Cologuard due next 04/20/23 - Bone Density: Up to date  -Hearing Test: Not applicable  -Spirometry: Up to date   PATIENT COUNSELING:  Advised to take 1 mg of folate supplement per day if capable of pregnancy.   Sexuality: Discussed sexually transmitted diseases, partner selection, use of condoms, avoidance of unintended pregnancy  and contraceptive alternatives.   Advised to avoid cigarette smoking.  I discussed with the patient that most people either abstain from alcohol or drink within safe limits (<=14/week and <=4 drinks/occasion for males, <=7/weeks and <= 3 drinks/occasion for females) and that the risk for alcohol disorders and other health effects rises proportionally with the number of drinks per week and how often a drinker exceeds daily limits.  Discussed cessation/primary prevention of drug use and availability of treatment for abuse.   Diet: Encouraged to adjust caloric intake to maintain  or achieve ideal body weight, to reduce intake of dietary saturated fat and total fat, to limit sodium intake by avoiding high  sodium foods and not adding table salt, and to maintain adequate dietary potassium and calcium preferably from fresh fruits, vegetables, and low-fat dairy products.    Stressed the importance of regular exercise  Injury prevention: Discussed safety belts, safety helmets, smoke detector, smoking near bedding or upholstery.   Dental health: Discussed importance of regular tooth brushing, flossing, and dental visits.    NEXT PREVENTATIVE PHYSICAL DUE IN 1 YEAR. Return in about 6 months (around 05/11/2023) for MOOD, COPD, HTN/HLD.

## 2022-11-08 NOTE — Assessment & Plan Note (Signed)
Chronic, ongoing.  Continue Levothyroxine 25 MCG daily.  Continue current medication regimen and adjust as needed.  Labs today, refills sent.

## 2022-11-08 NOTE — Assessment & Plan Note (Signed)
Ongoing in long time smoker and noted on CT lung screening.  Recommend complete cessation of smoking.  No current inhalers and stable. Labs: CBC.  Spirometry with mild restriction noted today, FEV1 91%, FEV1/FVC 65%.  Educated her at length on emphysema.  Start inhalers as needed in future.

## 2022-11-09 LAB — CBC WITH DIFFERENTIAL/PLATELET
Basophils Absolute: 0.1 10*3/uL (ref 0.0–0.2)
Basos: 1 %
EOS (ABSOLUTE): 0.4 10*3/uL (ref 0.0–0.4)
Eos: 5 %
Hematocrit: 42.8 % (ref 34.0–46.6)
Hemoglobin: 14.1 g/dL (ref 11.1–15.9)
Immature Grans (Abs): 0 10*3/uL (ref 0.0–0.1)
Immature Granulocytes: 0 %
Lymphocytes Absolute: 2.4 10*3/uL (ref 0.7–3.1)
Lymphs: 29 %
MCH: 30.6 pg (ref 26.6–33.0)
MCHC: 32.9 g/dL (ref 31.5–35.7)
MCV: 93 fL (ref 79–97)
Monocytes Absolute: 0.6 10*3/uL (ref 0.1–0.9)
Monocytes: 7 %
Neutrophils Absolute: 4.8 10*3/uL (ref 1.4–7.0)
Neutrophils: 58 %
Platelets: 320 10*3/uL (ref 150–450)
RBC: 4.61 x10E6/uL (ref 3.77–5.28)
RDW: 12.6 % (ref 11.7–15.4)
WBC: 8.3 10*3/uL (ref 3.4–10.8)

## 2022-11-09 LAB — LIPID PANEL W/O CHOL/HDL RATIO
Cholesterol, Total: 142 mg/dL (ref 100–199)
HDL: 50 mg/dL (ref >39)
LDL Chol Calc (NIH): 71 mg/dL (ref 0–99)
Triglycerides: 119 mg/dL (ref 0–149)
VLDL Cholesterol Cal: 21 mg/dL (ref 5–40)

## 2022-11-09 LAB — COMPREHENSIVE METABOLIC PANEL
ALT: 19 IU/L (ref 0–32)
AST: 25 IU/L (ref 0–40)
Albumin/Globulin Ratio: 1.7 (ref 1.2–2.2)
Albumin: 4.4 g/dL (ref 3.9–4.9)
Alkaline Phosphatase: 85 IU/L (ref 44–121)
BUN/Creatinine Ratio: 14 (ref 12–28)
BUN: 12 mg/dL (ref 8–27)
Bilirubin Total: 0.5 mg/dL (ref 0.0–1.2)
CO2: 24 mmol/L (ref 20–29)
Calcium: 10 mg/dL (ref 8.7–10.3)
Chloride: 103 mmol/L (ref 96–106)
Creatinine, Ser: 0.88 mg/dL (ref 0.57–1.00)
Globulin, Total: 2.6 g/dL (ref 1.5–4.5)
Glucose: 87 mg/dL (ref 70–99)
Potassium: 4.5 mmol/L (ref 3.5–5.2)
Sodium: 143 mmol/L (ref 134–144)
Total Protein: 7 g/dL (ref 6.0–8.5)
eGFR: 71 mL/min/{1.73_m2} (ref >59)

## 2022-11-09 LAB — VITAMIN D 25 HYDROXY (VIT D DEFICIENCY, FRACTURES): Vit D, 25-Hydroxy: 20.7 ng/mL — ABNORMAL LOW (ref 30.0–100.0)

## 2022-11-09 LAB — T4, FREE: Free T4: 1.2 ng/dL (ref 0.82–1.77)

## 2022-11-09 LAB — TSH: TSH: 2.98 u[IU]/mL (ref 0.450–4.500)

## 2022-11-09 NOTE — Progress Notes (Signed)
Contacted via MyChart   Good morning Michelle Espinoza, your labs have returned: HAPPY BIRTHDAY!! - Vitamin D level remains low, are you taking Vitamin D3 2000 units on a daily basis?  Let me know as we may need to adjust dosing to further help bone health and mood as well. - Remainder of your labs look fabulous, including cholesterol levels with LDL (bad cholesterol) going from 140 to 71 and total cholesterol from 213 to 142 with Zetia and Rosuvastatin on board.  Continue all current medications as ordered.  Any questions? Keep being amazing!!  Thank you for allowing me to participate in your care.  I appreciate you. Kindest regards, Jamieson Hetland

## 2022-11-21 ENCOUNTER — Ambulatory Visit
Admission: RE | Admit: 2022-11-21 | Discharge: 2022-11-21 | Disposition: A | Discharge status: Home / Self Care | Payer: Medicare Other | Source: Ambulatory Visit | Attending: Nurse Practitioner | Admitting: Nurse Practitioner

## 2022-11-21 DIAGNOSIS — Z87891 Personal history of nicotine dependence: Secondary | ICD-10-CM | POA: Diagnosis not present

## 2022-11-21 DIAGNOSIS — F17210 Nicotine dependence, cigarettes, uncomplicated: Secondary | ICD-10-CM

## 2022-11-21 DIAGNOSIS — Z122 Encounter for screening for malignant neoplasm of respiratory organs: Secondary | ICD-10-CM

## 2022-11-26 ENCOUNTER — Other Ambulatory Visit: Payer: Self-pay | Admitting: Acute Care

## 2022-11-26 DIAGNOSIS — F17210 Nicotine dependence, cigarettes, uncomplicated: Secondary | ICD-10-CM

## 2022-11-26 DIAGNOSIS — Z122 Encounter for screening for malignant neoplasm of respiratory organs: Secondary | ICD-10-CM

## 2022-11-26 DIAGNOSIS — Z87891 Personal history of nicotine dependence: Secondary | ICD-10-CM

## 2022-12-19 DIAGNOSIS — H524 Presbyopia: Secondary | ICD-10-CM | POA: Diagnosis not present

## 2023-01-24 DIAGNOSIS — K080 Exfoliation of teeth due to systemic causes: Secondary | ICD-10-CM | POA: Diagnosis not present

## 2023-02-28 DIAGNOSIS — F3342 Major depressive disorder, recurrent, in full remission: Secondary | ICD-10-CM | POA: Diagnosis not present

## 2023-05-06 NOTE — Patient Instructions (Signed)
Be Involved in Caring For Your Health:  Taking Medications When medications are taken as directed, they can greatly improve your health. But if they are not taken as prescribed, they may not work. In some cases, not taking them correctly can be harmful. To help ensure your treatment remains effective and safe, understand your medications and how to take them. Bring your medications to each visit for review by your provider.  Your lab results, notes, and after visit summary will be available on My Chart. We strongly encourage you to use this feature. If lab results are abnormal the clinic will contact you with the appropriate steps. If the clinic does not contact you assume the results are satisfactory. You can always view your results on My Chart. If you have questions regarding your health or results, please contact the clinic during office hours. You can also ask questions on My Chart.  We at [facility] are grateful that you chose [place] to provide your care. We strive to provide evidence-based and compassionate care and are always looking for feedback. If you get a survey from the clinic please complete this so we can hear your opinions.  Healthy Eating, Adult Healthy eating may help you get and keep a healthy body weight, reduce the risk of chronic disease, and live a long and productive life. It is important to follow a healthy eating pattern. Your nutritional and calorie needs should be met mainly by different nutrient-rich foods. What are tips for following this plan? Reading food labels Read labels and choose the following: Reduced or low sodium products. Juices with 100% fruit juice. Foods with low saturated fats (<3 g per serving) and high polyunsaturated and monounsaturated fats. Foods with whole grains, such as whole wheat, cracked wheat, brown rice, and wild rice. Whole grains that are fortified with folic acid. This is recommended for females who are pregnant or who want to  become pregnant. Read labels and do not eat or drink the following: Foods or drinks with added sugars. These include foods that contain brown sugar, corn sweetener, corn syrup, dextrose, fructose, glucose, high-fructose corn syrup, honey, invert sugar, lactose, malt syrup, maltose, molasses, raw sugar, sucrose, trehalose, or turbinado sugar. Limit your intake of added sugars to less than 10% of your total daily calories. Do not eat more than the following amounts of added sugar per day: 6 teaspoons (25 g) for females. 9 teaspoons (38 g) for males. Foods that contain processed or refined starches and grains. Refined grain products, such as white flour, degermed cornmeal, white bread, and white rice. Shopping Choose nutrient-rich snacks, such as vegetables, whole fruits, and nuts. Avoid high-calorie and high-sugar snacks, such as potato chips, fruit snacks, and candy. Use oil-based dressings and spreads on foods instead of solid fats such as butter, margarine, sour cream, or cream cheese. Limit pre-made sauces, mixes, and "instant" products such as flavored rice, instant noodles, and ready-made pasta. Try more plant-protein sources, such as tofu, tempeh, black beans, edamame, lentils, nuts, and seeds. Explore eating plans such as the Mediterranean diet or vegetarian diet. Try heart-healthy dips made with beans and healthy fats like hummus and guacamole. Vegetables go great with these. Cooking Use oil to saut or stir-fry foods instead of solid fats such as butter, margarine, or lard. Try baking, boiling, grilling, or broiling instead of frying. Remove the fatty part of meats before cooking. Steam vegetables in water or broth. Meal planning  At meals, imagine dividing your plate into fourths: One-half of  your plate is fruits and vegetables. One-fourth of your plate is whole grains. One-fourth of your plate is protein, especially lean meats, poultry, eggs, tofu, beans, or nuts. Include low-fat  dairy as part of your daily diet. Lifestyle Choose healthy options in all settings, including home, work, school, restaurants, or stores. Prepare your food safely: Wash your hands after handling raw meats. Where you prepare food, keep surfaces clean by regularly washing with hot, soapy water. Keep raw meats separate from ready-to-eat foods, such as fruits and vegetables. Cook seafood, meat, poultry, and eggs to the recommended temperature. Get a food thermometer. Store foods at safe temperatures. In general: Keep cold foods at 48F (4.4C) or below. Keep hot foods at 148F (60C) or above. Keep your freezer at [facility] (-17.8C) or below. Foods are not safe to eat if they have been between the temperatures of 40-148F (4.4-60C) for more than 2 hours. What foods should I eat? Fruits Aim to eat 1-2 cups of fresh, canned (in natural juice), or frozen fruits each day. One cup of fruit equals 1 small apple, 1 large banana, 8 large strawberries, 1 cup (237 g) canned fruit,  cup (82 g) dried fruit, or 1 cup (240 mL) 100% juice. Vegetables Aim to eat 2-4 cups of fresh and frozen vegetables each day, including different varieties and colors. One cup of vegetables equals 1 cup (91 g) broccoli or cauliflower florets, 2 medium carrots, 2 cups (150 g) raw, leafy greens, 1 large tomato, 1 large bell pepper, 1 large sweet potato, or 1 medium white potato. Grains Aim to eat 5-10 ounce-equivalents of whole grains each day. Examples of 1 ounce-equivalent of grains include 1 slice of bread, 1 cup (40 g) ready-to-eat cereal, 3 cups (24 g) popcorn, or  cup (93 g) cooked rice. Meats and other proteins Try to eat 5-7 ounce-equivalents of protein each day. Examples of 1 ounce-equivalent of protein include 1 egg,  oz nuts (12 almonds, 24 pistachios, or 7 walnut halves), 1/4 cup (90 g) cooked beans, 6 tablespoons (90 g) hummus or 1 tablespoon (16 g) peanut butter. A cut of meat or fish that is the size of a deck of  cards is about 3-4 ounce-equivalents (85 g). Of the protein you eat each week, try to have at least 8 sounce (227 g) of seafood. This is about 2 servings per week. This includes salmon, trout, herring, sardines, and anchovies. Dairy Aim to eat 3 cup-equivalents of fat-free or low-fat dairy each day. Examples of 1 cup-equivalent of dairy include 1 cup (240 mL) milk, 8 ounces (250 g) yogurt, 1 ounces (44 g) natural cheese, or 1 cup (240 mL) fortified soy milk. Fats and oils Aim for about 5 teaspoons (21 g) of fats and oils per day. Choose monounsaturated fats, such as canola and olive oils, mayonnaise made with olive oil or avocado oil, avocados, peanut butter, and most nuts, or polyunsaturated fats, such as sunflower, corn, and soybean oils, walnuts, pine nuts, sesame seeds, sunflower seeds, and flaxseed. Beverages Aim for 6 eight-ounce glasses of water per day. Limit coffee to 3-5 eight-ounce cups per day. Limit caffeinated beverages that have added calories, such as soda and energy drinks. If you drink alcohol: Limit how much you have to: 0-1 drink a day if you are female. 0-2 drinks a day if you are female. Know how much alcohol is in your drink. In the [place] one drink is one 12 oz bottle of beer (355 mL), one 5 oz glass of wine (  148 mL), or one 1 oz glass of hard liquor (44 mL). Seasoning and other foods Try not to add too much salt to your food. Try using herbs and spices instead of salt. Try not to add sugar to food. This information is based on [place] nutrition guidelines. To learn more, visit [employer]. Exact amounts may vary. You may need different amounts. This information is not intended to replace advice given to you by your health care provider. Make sure you discuss any questions you have with your health care provider. Document Revised: 03/27/2022 Document Reviewed: 03/27/2022 Elsevier Patient Education  2024 [employer].

## 2023-05-11 ENCOUNTER — Encounter: Payer: Self-pay | Admitting: Nurse Practitioner

## 2023-05-11 ENCOUNTER — Ambulatory Visit: Payer: Medicare Other | Admitting: Nurse Practitioner

## 2023-05-11 VITALS — BP 132/80 | HR 78 | Temp 98.0°F | Ht 63.0 in | Wt 171.0 lb

## 2023-05-11 DIAGNOSIS — I10 Essential (primary) hypertension: Secondary | ICD-10-CM | POA: Diagnosis not present

## 2023-05-11 DIAGNOSIS — L219 Seborrheic dermatitis, unspecified: Secondary | ICD-10-CM | POA: Insufficient documentation

## 2023-05-11 DIAGNOSIS — F17210 Nicotine dependence, cigarettes, uncomplicated: Secondary | ICD-10-CM

## 2023-05-11 DIAGNOSIS — E559 Vitamin D deficiency, unspecified: Secondary | ICD-10-CM | POA: Diagnosis not present

## 2023-05-11 DIAGNOSIS — F3342 Major depressive disorder, recurrent, in full remission: Secondary | ICD-10-CM

## 2023-05-11 DIAGNOSIS — J432 Centrilobular emphysema: Secondary | ICD-10-CM

## 2023-05-11 DIAGNOSIS — E782 Mixed hyperlipidemia: Secondary | ICD-10-CM | POA: Diagnosis not present

## 2023-05-11 DIAGNOSIS — E66811 Obesity, class 1: Secondary | ICD-10-CM

## 2023-05-11 DIAGNOSIS — E063 Autoimmune thyroiditis: Secondary | ICD-10-CM

## 2023-05-11 DIAGNOSIS — G2581 Restless legs syndrome: Secondary | ICD-10-CM

## 2023-05-11 DIAGNOSIS — I700 Atherosclerosis of aorta: Secondary | ICD-10-CM | POA: Diagnosis not present

## 2023-05-11 DIAGNOSIS — E6609 Other obesity due to excess calories: Secondary | ICD-10-CM

## 2023-05-11 DIAGNOSIS — Z1211 Encounter for screening for malignant neoplasm of colon: Secondary | ICD-10-CM

## 2023-05-11 MED ORDER — PREDNISONE 20 MG PO TABS: 40.0000 mg | ORAL_TABLET | Freq: Every day | ORAL | 0 refills | Status: AC

## 2023-05-11 MED ORDER — AZITHROMYCIN 250 MG PO TABS: ORAL_TABLET | ORAL | 0 refills | Status: AC

## 2023-05-11 MED ORDER — KETOCONAZOLE 2 % EX SHAM: 1.0000 | MEDICATED_SHAMPOO | CUTANEOUS | 4 refills | Status: AC

## 2023-05-11 MED ORDER — FLUOXETINE HCL 20 MG PO CAPS: 20.0000 mg | ORAL_CAPSULE | Freq: Every day | ORAL | 4 refills | Status: DC

## 2023-05-11 MED ORDER — ALBUTEROL SULFATE HFA 108 (90 BASE) MCG/ACT IN AERS: 2.0000 | INHALATION_SPRAY | Freq: Four times a day (QID) | RESPIRATORY_TRACT | 4 refills | Status: AC | PRN

## 2023-05-11 NOTE — Assessment & Plan Note (Signed)
Chronic, ongoing.  BP at goal today in office  Recommend she monitor BP at least a few mornings a week at home and document.  DASH diet at home.  Continue current medication regimen and adjust as needed.  Labs today: CMP.  Return in 6 months.

## 2023-05-11 NOTE — Assessment & Plan Note (Signed)
Chronic, ongoing.  Continue Levothyroxine 25 MCG daily.  Continue current medication regimen and adjust as needed.  Labs up to date.

## 2023-05-11 NOTE — Progress Notes (Signed)
BP 132/80 (BP Location: Left Arm, Patient Position: Sitting, Cuff Size: Normal)   Pulse 78   Temp 98 F (36.7 C) (Oral)   Ht 5\' 3"  (1.6 m)   Wt 171 lb (77.6 kg)   SpO2 96%   BMI 30.29 kg/m    Subjective:    Patient ID: Michelle Espinoza, female    DOB: 25-Feb-1951, 72 y.o.   MRN: [id number]  HPI: Michelle Espinoza is a 72 y.o. female  Chief Complaint  Patient presents with   Depression   Hypertension   Hyperlipidemia   Hypothyroidism   COPD    Consistent cough tha wont go away. Has been going on for 1 week. Has not been feeling well, performed covid test   Rash    possible seborrhoic  keratosis on top of scalp, would like to also discuss    Wants to have scalp looked at -- concerns for dry skin.   HYPERTENSION / HYPERLIPIDEMIA Continues Losartan-HCTZ 100-12.5 daily and Zetia + Rosuvastatin.  Occasionally misses doses, especially over past two weeks while sick.   Satisfied with current treatment? yes Duration of hypertension: chronic BP monitoring frequency: not checking BP range:  BP medication side effects: no Duration of hyperlipidemia: chronic Cholesterol medication side effects: no Cholesterol supplements: none Medication compliance: fair compliance Aspirin: yes Recent stressors: no Recurrent headaches: no Visual changes: no Palpitations: no Dyspnea: no Chest pain: no Lower extremity edema: no Dizzy/lightheaded: no   COPD Currently has a cough that started one week ago, initially almost constant and now more intermittent.  Occasionally productive. No current inhalers.    Is a smoker, smokes 1/2 PPD.  Has smoked since she was 17-18.  Lung screening in May 2023 noted centrilobular emphysema and aortic atherosclerosis + cholelithiasis.   COPD status: stable Satisfied with current treatment?: yes Oxygen use: no Dyspnea frequency: no and not wheezing Cough frequency: worse over the past week with URI Rescue inhaler frequency: does not use one Limitation of  activity: no Productive cough: yes, for one week Last Spirometry: 11/08/2022 Pneumovax: Up to Date Influenza: Up to Date   HYPOTHYROIDISM Continues on Levothyroxine 25 MCG daily. Taking Vitamin D daily. Thyroid control status: controlled Satisfied with current treatment? yes Medication side effects: no Medication compliance: fair compliance Etiology of hypothyroidism:  Recent dose adjustment:no Fatigue: no Cold intolerance: no Heat intolerance: no Weight gain: no Weight loss: no Constipation: no Diarrhea/loose stools: no Palpitations: no Lower extremity edema: no Anxiety/depressed mood: no   DEPRESSION Currently taking Wellbutrin and Prozac daily.  Did follow with Dr. Maryruth Bun, last saw 04/20/22, currently PCP is following. Mood status: stable Satisfied with current treatment?: yes Symptom severity: moderate  Duration of current treatment : chronic Side effects: no Medication compliance: good compliance Psychotherapy/counseling: yes current Depressed mood: no Anxious mood: no Anhedonia: no Significant weight loss or gain: no Insomnia: none Fatigue: on occasion Feelings of worthlessness or guilt: no Impaired concentration/indecisiveness: no Suicidal ideations: no Hopelessness: no Crying spells: no    05/11/2023   10:53 AM 11/08/2022   10:46 AM 06/20/2022    1:08 PM 05/10/2022   10:14 AM 12/06/2021    1:18 PM  Depression screen PHQ 2/9  Decreased Interest 0 0 0 0 0  Down, Depressed, Hopeless 0 0 0 0 0  PHQ - 2 Score 0 0 0 0 0  Altered sleeping 0 0 0 0 0  Tired, decreased energy 0 0 0 0 0  Change in appetite 0 0 0 0 0  Feeling bad or failure about yourself  0 0 0 0 0  Trouble concentrating 0 0 0 0 0  Moving slowly or fidgety/restless 0 0 0 0 0  Suicidal thoughts 0 0 0 0 0  PHQ-9 Score 0 0 0 0 0  Difficult doing work/chores  Not difficult at all Not difficult at all Not difficult at all        05/11/2023   10:53 AM 11/08/2022   10:47 AM 05/10/2022   10:14 AM  12/06/2021    1:18 PM  GAD 7 : Generalized Anxiety Score  Nervous, Anxious, on Edge 0 0 0 0  Control/stop worrying 0 0 0 0  Worry too much - different things 0 0 0 0  Trouble relaxing 0 0 0 0  Restless 0 0 0 0  Easily annoyed or irritable 0 0 0 0  Afraid - awful might happen 0 0 0 0  Total GAD 7 Score 0 0 0 0  Anxiety Difficulty  Not difficult at all Not difficult at all     Relevant past medical, surgical, family and social history reviewed and updated as indicated. Interim medical history since our last visit reviewed. Allergies and medications reviewed and updated.  Review of Systems  Constitutional:  Positive for fatigue. Negative for activity change, appetite change, diaphoresis and fever.  HENT:  Negative for congestion, ear discharge, ear pain, postnasal drip, rhinorrhea, sinus pressure, sinus pain and sore throat.   Respiratory:  Positive for cough and wheezing. Negative for chest tightness and shortness of breath.   Cardiovascular:  Negative for chest pain, palpitations and leg swelling.  Gastrointestinal: Negative.   Endocrine: Negative for cold intolerance and heat intolerance.  Neurological: Negative.   Psychiatric/Behavioral: Negative.      Per HPI unless specifically indicated above     Objective:    BP 132/80 (BP Location: Left Arm, Patient Position: Sitting, Cuff Size: Normal)   Pulse 78   Temp 98 F (36.7 C) (Oral)   Ht 5\' 3"  (1.6 m)   Wt 171 lb (77.6 kg)   SpO2 96%   BMI 30.29 kg/m   Wt Readings from Last 3 Encounters:  05/11/23 171 lb (77.6 kg)  11/08/22 170 lb 12.8 oz (77.5 kg)  05/10/22 170 lb 12.8 oz (77.5 kg)    Physical Exam Vitals and nursing note reviewed.  Constitutional:      General: She is awake. She is not in acute distress.    Appearance: She is well-developed. She is obese. She is not ill-appearing.  HENT:     Head: Normocephalic.     Comments: Patches of dry, flaky skin to scalp area.    Right Ear: Hearing, tympanic membrane, ear  canal and external ear normal. No middle ear effusion. Tympanic membrane is not injected.     Left Ear: Hearing, tympanic membrane, ear canal and external ear normal.  No middle ear effusion. Tympanic membrane is not injected.     Nose: No rhinorrhea.     Right Sinus: No maxillary sinus tenderness or frontal sinus tenderness.     Left Sinus: No maxillary sinus tenderness or frontal sinus tenderness.     Mouth/Throat:     Mouth: Mucous membranes are moist.     Pharynx: Posterior oropharyngeal erythema (mild) present. No pharyngeal swelling or oropharyngeal exudate.  Eyes:     General: Lids are normal.        Right eye: No discharge.        Left eye:  No discharge.     Conjunctiva/sclera: Conjunctivae normal.     Pupils: Pupils are equal, round, and reactive to light.  Neck:     Thyroid: No thyromegaly.     Vascular: No carotid bruit.  Cardiovascular:     Rate and Rhythm: Normal rate and regular rhythm.     Heart sounds: Normal heart sounds. No murmur heard.    No gallop.  Pulmonary:     Effort: Pulmonary effort is normal. No accessory muscle usage or respiratory distress.     Breath sounds: Wheezing present. No decreased breath sounds or rhonchi.     Comments: Intermittent expiratory wheezes noted throughout. Abdominal:     General: Bowel sounds are normal.     Palpations: Abdomen is soft.  Musculoskeletal:     Cervical back: Normal range of motion and neck supple.     Right lower leg: No edema.     Left lower leg: No edema.  Skin:    General: Skin is warm and dry.  Neurological:     Mental Status: She is alert and oriented to person, place, and time.  Psychiatric:        Attention and Perception: Attention normal.        Mood and Affect: Mood normal.        Speech: Speech normal.        Behavior: Behavior normal. Behavior is cooperative.        Thought Content: Thought content normal.    Results for orders placed or performed in visit on 11/08/22  CBC with  Differential/Platelet  Result Value Ref Range   WBC 8.3 3.4 - 10.8 x10E3/uL   RBC 4.61 3.77 - 5.28 x10E6/uL   Hemoglobin 14.1 11.1 - 15.9 g/dL   Hematocrit 40.9 81.1 - 46.6 %   MCV 93 79 - 97 fL   MCH 30.6 26.6 - 33.0 pg   MCHC 32.9 31.5 - 35.7 g/dL   RDW 91.4 78.2 - 95.6 %   Platelets 320 150 - 450 x10E3/uL   Neutrophils 58 Not Estab. %   Lymphs 29 Not Estab. %   Monocytes 7 Not Estab. %   Eos 5 Not Estab. %   Basos 1 Not Estab. %   Neutrophils Absolute 4.8 1.4 - 7.0 x10E3/uL   Lymphocytes Absolute 2.4 0.7 - 3.1 x10E3/uL   Monocytes Absolute 0.6 0.1 - 0.9 x10E3/uL   EOS (ABSOLUTE) 0.4 0.0 - 0.4 x10E3/uL   Basophils Absolute 0.1 0.0 - 0.2 x10E3/uL   Immature Granulocytes 0 Not Estab. %   Immature Grans (Abs) 0.0 0.0 - 0.1 x10E3/uL  Comprehensive metabolic panel  Result Value Ref Range   Glucose 87 70 - 99 mg/dL   BUN 12 8 - 27 mg/dL   Creatinine, Ser 2.13 0.57 - 1.00 mg/dL   eGFR 71 >08 MV/HQI/6.96   BUN/Creatinine Ratio 14 12 - 28   Sodium 143 134 - 144 mmol/L   Potassium 4.5 3.5 - 5.2 mmol/L   Chloride 103 96 - 106 mmol/L   CO2 24 20 - 29 mmol/L   Calcium 10.0 8.7 - 10.3 mg/dL   Total Protein 7.0 6.0 - 8.5 g/dL   Albumin 4.4 3.9 - 4.9 g/dL   Globulin, Total 2.6 1.5 - 4.5 g/dL   Albumin/Globulin Ratio 1.7 1.2 - 2.2   Bilirubin Total 0.5 0.0 - 1.2 mg/dL   Alkaline Phosphatase 85 44 - 121 IU/L   AST 25 0 - 40 IU/L   ALT 19 0 -  32 IU/L  Lipid Panel w/o Chol/HDL Ratio  Result Value Ref Range   Cholesterol, Total 142 100 - 199 mg/dL   Triglycerides 161 0 - 149 mg/dL   HDL 50 >09 mg/dL   VLDL Cholesterol Cal 21 5 - 40 mg/dL   LDL Chol Calc (NIH) 71 0 - 99 mg/dL  TSH  Result Value Ref Range   TSH 2.980 0.450 - 4.500 uIU/mL  T4, free  Result Value Ref Range   Free T4 1.20 0.82 - 1.77 ng/dL  VITAMIN D 25 Hydroxy (Vit-D Deficiency, Fractures)  Result Value Ref Range   Vit D, 25-Hydroxy 20.7 (L) 30.0 - 100.0 ng/mL      Assessment & Plan:   Problem List Items  Addressed This Visit       Cardiovascular and Mediastinum   Aortic atherosclerosis (HCC)    Chronic.  Noted on CT lung screening -- at this time recommend complete cessation of smoking and continue statin therapy for prevention.      Essential hypertension    Chronic, ongoing.  BP at goal today in office  Recommend she monitor BP at least a few mornings a week at home and document.  DASH diet at home.  Continue current medication regimen and adjust as needed.  Labs today: CMP.  Return in 6 months.          Respiratory   Centrilobular emphysema (HCC) - Primary    Chronic.  Ongoing in long time smoker and noted on CT lung screening.  Recommend complete cessation of smoking.  No current inhalers and stable. Labs: up to date.  Spirometry with mild restriction noted in May 2024, FEV1 91%, FEV1/FVC 65%.  Educated her at length on emphysema.  Start inhalers as needed in future.  Suspect current exacerbation, will start Prednisone 40 MG daily for 5 days and Augmentin BID for 7 days + send in Albuterol inhaler to use as needed.      Relevant Medications   azithromycin (ZITHROMAX) 250 MG tablet   predniSONE (DELTASONE) 20 MG tablet   albuterol (VENTOLIN HFA) 108 (90 Base) MCG/ACT inhaler   Other Relevant Orders   Novel Coronavirus, NAA ([facility])     Endocrine   Hashimoto's thyroiditis    Chronic, ongoing.  Continue Levothyroxine 25 MCG daily.  Continue current medication regimen and adjust as needed.  Labs up to date.        Musculoskeletal and Integument   Seborrheic dermatitis    To scalp area, will send in shampoo to trial and if ongoing or worsening start short burst steroid lotion.        Other   Hyperlipidemia    Chronic, stable.  Continue current medication regimen and adjust as needed.  Lipid panel today.      Relevant Orders   Comprehensive metabolic panel   Lipid Panel w/o Chol/HDL Ratio   Nicotine dependence, cigarettes, uncomplicated    I have recommended complete  cessation of tobacco use. I have discussed various options available for assistance with tobacco cessation including over the counter methods (Nicotine gum, patch and lozenges). We also discussed prescription options (Chantix, Nicotine Inhaler / Nasal Spray). The patient is not interested in pursuing any prescription tobacco cessation options at this time.  Continue annual screening.       Obesity    BMI 30.29.  Recommended eating smaller high protein, low fat meals more frequently and exercising 30 mins a day 5 times a week with a goal of 10-15lb weight loss  in the next 3 months. Patient voiced their understanding and motivation to adhere to these recommendations.       Recurrent major depressive disorder, in full remission (HCC)    Chronic, stable.  Denies SI/HI.  Continue current medication regimen and adjust as needed.  Will have return to psychiatry as needed if worsening mood presents.      Relevant Medications   FLUoxetine (PROZAC) 20 MG capsule   Vitamin D deficiency    Chronic.  Noted past labs, recommend she continue supplement and adjust as needed.  Check level today.      Relevant Orders   VITAMIN D 25 Hydroxy (Vit-D Deficiency, Fractures)   Other Visit Diagnoses     Colon cancer screening       Cologuard ordered and discussed with patient.   Relevant Orders   Cologuard        Follow up plan: Return in about 6 months (around 11/08/2023) for Annual Physical after 11/08/23 + needs Medicare wellness after 06/21/23.

## 2023-05-11 NOTE — Assessment & Plan Note (Signed)
Chronic.  Noted on CT lung screening -- at this time recommend complete cessation of smoking and continue statin therapy for prevention. 

## 2023-05-11 NOTE — Assessment & Plan Note (Signed)
Chronic.  Ongoing in long time smoker and noted on CT lung screening.  Recommend complete cessation of smoking.  No current inhalers and stable. Labs: up to date.  Spirometry with mild restriction noted in May 2024, FEV1 91%, FEV1/FVC 65%.  Educated her at length on emphysema.  Start inhalers as needed in future.  Suspect current exacerbation, will start Prednisone 40 MG daily for 5 days and Augmentin BID for 7 days + send in Albuterol inhaler to use as needed.

## 2023-05-11 NOTE — Assessment & Plan Note (Signed)
Chronic, stable.  Continue current medication regimen and adjust as needed.  Lipid panel today. 

## 2023-05-11 NOTE — Assessment & Plan Note (Signed)
I have recommended complete cessation of tobacco use. I have discussed various options available for assistance with tobacco cessation including over the counter methods (Nicotine gum, patch and lozenges). We also discussed prescription options (Chantix, Nicotine Inhaler / Nasal Spray). The patient is not interested in pursuing any prescription tobacco cessation options at this time.  Continue annual screening.

## 2023-05-11 NOTE — Assessment & Plan Note (Signed)
BMI 30.29.  Recommended eating smaller high protein, low fat meals more frequently and exercising 30 mins a day 5 times a week with a goal of 10-15lb weight loss in the next 3 months. Patient voiced their understanding and motivation to adhere to these recommendations.

## 2023-05-11 NOTE — Assessment & Plan Note (Signed)
Chronic.  Noted past labs, recommend she continue supplement and adjust as needed.  Check level today.

## 2023-05-11 NOTE — Assessment & Plan Note (Signed)
To scalp area, will send in shampoo to trial and if ongoing or worsening start short burst steroid lotion.

## 2023-05-11 NOTE — Assessment & Plan Note (Signed)
Chronic, stable.  Denies SI/HI.  Continue current medication regimen and adjust as needed.  Will have return to psychiatry as needed if worsening mood presents.

## 2023-05-12 LAB — COMPREHENSIVE METABOLIC PANEL
ALT: 19 [IU]/L (ref 0–32)
AST: 24 [IU]/L (ref 0–40)
Albumin: 4.2 g/dL (ref 3.8–4.8)
Alkaline Phosphatase: 82 [IU]/L (ref 44–121)
BUN/Creatinine Ratio: 19 (ref 12–28)
BUN: 15 mg/dL (ref 8–27)
Bilirubin Total: 0.4 mg/dL (ref 0.0–1.2)
CO2: 24 mmol/L (ref 20–29)
Calcium: 9 mg/dL (ref 8.7–10.3)
Chloride: 104 mmol/L (ref 96–106)
Creatinine, Ser: 0.79 mg/dL (ref 0.57–1.00)
Globulin, Total: 2.3 g/dL (ref 1.5–4.5)
Glucose: 91 mg/dL (ref 70–99)
Potassium: 4.1 mmol/L (ref 3.5–5.2)
Sodium: 142 mmol/L (ref 134–144)
Total Protein: 6.5 g/dL (ref 6.0–8.5)
eGFR: 80 mL/min/{1.73_m2} (ref >59)

## 2023-05-12 LAB — LIPID PANEL W/O CHOL/HDL RATIO
Cholesterol, Total: 114 mg/dL (ref 100–199)
HDL: 46 mg/dL (ref >39)
LDL Chol Calc (NIH): 50 mg/dL (ref 0–99)
Triglycerides: 96 mg/dL (ref 0–149)
VLDL Cholesterol Cal: 18 mg/dL (ref 5–40)

## 2023-05-12 LAB — VITAMIN D 25 HYDROXY (VIT D DEFICIENCY, FRACTURES): Vit D, 25-Hydroxy: 23.8 ng/mL — ABNORMAL LOW (ref 30.0–100.0)

## 2023-05-12 LAB — NOVEL CORONAVIRUS, NAA: SARS-CoV-2, NAA: NOT DETECTED

## 2023-05-12 NOTE — Progress Notes (Signed)
Contacted via MyChart   Good morning Michelle Espinoza, your labs have returned.  Vitamin D remains a little low, I do recommend you ensure you are taking Vitamin D3 2000 units daily for bone health. Continue all current medications.  Any questions? Keep being stellar!!  Thank you for allowing me to participate in your care.  I appreciate you. Kindest regards, Manaal Mandala

## 2023-05-12 NOTE — Progress Notes (Signed)
Contacted via [place] ? ? ?Covid is negative!!!

## 2023-05-17 DIAGNOSIS — I10 Essential (primary) hypertension: Secondary | ICD-10-CM | POA: Diagnosis not present

## 2023-05-17 DIAGNOSIS — I700 Atherosclerosis of aorta: Secondary | ICD-10-CM | POA: Diagnosis not present

## 2023-05-17 DIAGNOSIS — E782 Mixed hyperlipidemia: Secondary | ICD-10-CM | POA: Diagnosis not present

## 2023-05-17 DIAGNOSIS — J432 Centrilobular emphysema: Secondary | ICD-10-CM | POA: Diagnosis not present

## 2023-05-22 ENCOUNTER — Ambulatory Visit: Payer: Self-pay

## 2023-05-22 NOTE — Telephone Encounter (Signed)
  Chief Complaint: hypotension Symptoms: BP 103/67, lower Bps as well. dizziness Frequency: 2 days  Pertinent Negatives: Patient denies passing out  Disposition: [] ED /[] Urgent Care (no appt availability in office) / [x] Appointment(In office/virtual)/ []  [place]/ [] Home Care/ [] Refused Recommended Disposition /[] [facility]/ []  Follow-up with PCP Additional Notes: pt states she has had dizziness and low BP. Took BP at 0730 120/84, checked again at 1315 d/t dizziness 103/67. Pt had same thing happen yesterday. Pt is still taking Losartan hydrochlorothiazide in the mornings. Scheduled OV for 05/24/23 at 1040 with Clydie Braun, NP. Care advice given, encouraged pt to call back if BP drops lower or passing out, may would need to go to ED for eval. Pt verbalized understanding.   Summary: bp concern   The patient would like to speak with a member of staff when possible regarding their BP  The patient shares that when checked today at 1:15 it was 103/67  The patient shares that they have experienced dizziness and lightheadedness  Please contact further when possible         Reason for Disposition  [1] Systolic BP 90-110 AND [2] taking blood pressure medications AND [3] dizzy, lightheaded or weak  Answer Assessment - Initial Assessment Questions 1. BLOOD PRESSURE: "What is the blood pressure?" "Did you take at least two measurements 5 minutes apart?"     0730 120/84 [id number]/67, yesterday evening 105/70, 93/60 2. ONSET: "When did you take your blood pressure?"     Yesterday  3. HOW: "How did you obtain the blood pressure?" (e.g., visiting nurse, automatic home BP monitor)     Home BP  4. HISTORY: "Do you have a history of low blood pressure?" "What is your blood pressure normally?"     No HTN  5. MEDICINES: "Are you taking any medications for blood pressure?" If Yes, ask: "Have they been changed recently?"     yes 7. OTHER SYMPTOMS: "Have you been sick recently?" "Have  you had a recent injury?"     Dizziness and HA  Protocols used: Blood Pressure - Low-A-AH

## 2023-05-23 NOTE — Progress Notes (Unsigned)
   There were no vitals taken for this visit.   Subjective:    Patient ID: Michelle Espinoza, female    DOB: [birth year], 72 y.o.   MRN: [id number]  HPI: Michelle Espinoza is a 72 y.o. female  No chief complaint on file.   Relevant past medical, surgical, family and social history reviewed and updated as indicated. Interim medical history since our last visit reviewed. Allergies and medications reviewed and updated.  Review of Systems  Per HPI unless specifically indicated above     Objective:    There were no vitals taken for this visit.  Wt Readings from Last 3 Encounters:  05/11/23 171 lb (77.6 kg)  11/08/22 170 lb 12.8 oz (77.5 kg)  05/10/22 170 lb 12.8 oz (77.5 kg)    Physical Exam  Results for orders placed or performed in visit on 05/11/23  Novel Coronavirus, NAA ([facility])   Specimen: Nasopharyngeal(NP) swabs in vial transport medium  Result Value Ref Range   SARS-CoV-2, NAA Not Detected Not Detected  Comprehensive metabolic panel  Result Value Ref Range   Glucose 91 70 - 99 mg/dL   BUN 15 8 - 27 mg/dL   Creatinine, Ser 3.87 0.57 - 1.00 mg/dL   eGFR 80 >56 EP/PIR/5.18   BUN/Creatinine Ratio 19 12 - 28   Sodium 142 134 - 144 mmol/L   Potassium 4.1 3.5 - 5.2 mmol/L   Chloride 104 96 - 106 mmol/L   CO2 24 20 - 29 mmol/L   Calcium 9.0 8.7 - 10.3 mg/dL   Total Protein 6.5 6.0 - 8.5 g/dL   Albumin 4.2 3.8 - 4.8 g/dL   Globulin, Total 2.3 1.5 - 4.5 g/dL   Bilirubin Total 0.4 0.0 - 1.2 mg/dL   Alkaline Phosphatase 82 44 - 121 IU/L   AST 24 0 - 40 IU/L   ALT 19 0 - 32 IU/L  Lipid Panel w/o Chol/HDL Ratio  Result Value Ref Range   Cholesterol, Total 114 100 - 199 mg/dL   Triglycerides 96 0 - 149 mg/dL   HDL 46 >84 mg/dL   VLDL Cholesterol Cal 18 5 - 40 mg/dL   LDL Chol Calc (NIH) 50 0 - 99 mg/dL  VITAMIN D 25 Hydroxy (Vit-D Deficiency, Fractures)  Result Value Ref Range   Vit D, 25-Hydroxy 23.8 (L) 30.0 - 100.0 ng/mL      Assessment & Plan:   Problem List  Items Addressed This Visit   None    Follow up plan: No follow-ups on file.

## 2023-05-24 ENCOUNTER — Encounter: Payer: Self-pay | Admitting: Nurse Practitioner

## 2023-05-24 ENCOUNTER — Ambulatory Visit (INDEPENDENT_AMBULATORY_CARE_PROVIDER_SITE_OTHER): Payer: Medicare Other | Admitting: Nurse Practitioner

## 2023-05-24 VITALS — BP 128/81 | HR 88 | Temp 97.7°F | Ht 63.0 in | Wt 169.6 lb

## 2023-05-24 DIAGNOSIS — R55 Syncope and collapse: Secondary | ICD-10-CM | POA: Diagnosis not present

## 2023-06-02 DIAGNOSIS — Z1211 Encounter for screening for malignant neoplasm of colon: Secondary | ICD-10-CM | POA: Diagnosis not present

## 2023-06-12 LAB — COLOGUARD: COLOGUARD: NEGATIVE

## 2023-06-12 NOTE — Progress Notes (Signed)
Contacted via MyChart   Good evening Pax, your Cologuard returned negative.  Woohoo!!  Repeat in 3 years.

## 2023-07-12 ENCOUNTER — Ambulatory Visit: Payer: Medicare Other | Admitting: Emergency Medicine

## 2023-07-12 VITALS — BP 130/86 | Ht 63.0 in | Wt 165.0 lb

## 2023-07-12 DIAGNOSIS — Z0000 Encounter for general adult medical examination without abnormal findings: Secondary | ICD-10-CM | POA: Diagnosis not present

## 2023-07-12 DIAGNOSIS — Z1231 Encounter for screening mammogram for malignant neoplasm of breast: Secondary | ICD-10-CM

## 2023-07-12 NOTE — Patient Instructions (Addendum)
 [name] , Thank you for taking time to come for your Medicare Wellness Visit. I appreciate your ongoing commitment to your health goals. Please review the following plan we discussed and let me know if I can assist you in the future.   Referrals/Orders/Follow-Ups/Clinician Recommendations: I have placed an order for a mammogram. Call [facility] @ [phone] to schedule at your earliest convenience. Call [facility] @ [phone] to schedule your low dose lung CT scan that is due 11/21/23.  This is a list of the screening recommended for you and due dates:  Health Maintenance  Topic Date Due   Mammogram  01/24/2023   COVID-19 Vaccine (4 - 2024-25 season) 03/11/2023   Screening for Lung Cancer  11/21/2023   Medicare Annual Wellness Visit  07/11/2024   Cologuard (Stool DNA test)  06/01/2026   DTaP/Tdap/Td vaccine (3 - Tdap) 01/10/2027   DEXA scan (bone density measurement)  04/10/2027   Pneumonia Vaccine  Completed   Flu Shot  Completed   Hepatitis C Screening  Completed   Zoster (Shingles) Vaccine  Completed   HPV Vaccine  Aged Out    Advanced directives: (Copy Requested) Please bring a copy of your health care power of attorney and living will to the office to be added to your chart at your convenience.  Next Medicare Annual Wellness Visit scheduled for next year: Yes, 07/24/24 @ 10:40am

## 2023-07-12 NOTE — Progress Notes (Signed)
 Subjective:   Michelle Espinoza is a 73 y.o. female who presents for Medicare Annual (Subsequent) preventive examination.  Visit Complete: Virtual I connected with  Michelle Espinoza on 07/12/23 by a video and audio enabled telemedicine application and verified that I am speaking with the correct person using two identifiers.  Patient Location: Home  Provider Location: Home Office  I discussed the limitations of evaluation and management by telemedicine. The patient expressed understanding and agreed to proceed.  Vital Signs: Because this visit was a virtual/telehealth visit, some criteria may be missing or patient reported. Any vitals not documented were not able to be obtained and vitals that have been documented are patient reported.  Patient Medicare AWV questionnaire was completed by the patient on 07/12/23; I have confirmed that all information answered by patient is correct and no changes since this date.  Cardiac Risk Factors include: advanced age (>44men, >58 women);hypertension;dyslipidemia;smoking/ tobacco exposure     Objective:    Today's Vitals   07/12/23 1117  BP: 130/86  Weight: 165 lb (74.8 kg)  Height: 5' 3 (1.6 m)   Body mass index is 29.23 kg/m.     07/12/2023   11:32 AM 06/20/2022    1:01 PM 06/01/2021   12:18 PM 05/31/2020    1:02 PM 01/23/2019    8:57 AM 07/01/2018    9:30 PM 01/18/2018    9:05 AM  Advanced Directives  Does Patient Have a Medical Advance Directive? Yes No No No No No No  Type of Estate Agent of [place];Living will        Does patient want to make changes to medical advance directive? No - Patient declined        Copy of Healthcare Power of Attorney in Chart? No - copy requested        Would patient like information on creating a medical advance directive?  No - Patient declined No - Patient declined   No - Patient declined Yes (MAU/Ambulatory/Procedural Areas - Information given)    Current Medications  (verified) Outpatient Encounter Medications as of 07/12/2023  Medication Sig   albuterol  (VENTOLIN  HFA) 108 (90 Base) MCG/ACT inhaler Inhale 2 puffs into the lungs every 6 (six) hours as needed.   aspirin EC 81 MG tablet Take 81 mg by mouth daily.   buPROPion  (WELLBUTRIN  SR) 100 MG 12 hr tablet Take 100 mg by mouth in the morning and at bedtime.   cyanocobalamin  2000 MCG tablet Take 2,000 mcg by mouth daily.   ezetimibe  (ZETIA ) 10 MG tablet Take 1 tablet (10 mg total) by mouth daily.   FLUoxetine  (PROZAC ) 20 MG capsule Take 1 capsule (20 mg total) by mouth daily.   ketoconazole  (NIZORAL ) 2 % shampoo Apply 1 Application topically 2 (two) times a week.   levothyroxine  (SYNTHROID ) 25 MCG tablet Take 1 tablet (25 mcg total) by mouth daily before breakfast.   loratadine (CLARITIN REDITABS) 10 MG dissolvable tablet Take 10 mg by mouth daily.   losartan -hydrochlorothiazide  (HYZAAR) 100-12.5 MG tablet Take 1 tablet by mouth daily.   Magnesium Hydroxide (MAGNESIA PO) Take by mouth.   Multiple Vitamin (MULTIVITAMIN) tablet Take 1 tablet by mouth daily.   Multiple Vitamins-Minerals (VITAMIN D3 COMPLETE PO) Take by mouth.   rosuvastatin  (CRESTOR ) 40 MG tablet Take 1 tablet (40 mg total) by mouth daily.   No facility-administered encounter medications on file as of 07/12/2023.    Allergies (verified) Lovastatin and Penicillins   History: Past Medical History:  Diagnosis  Date   Allergy    Depression    Essential hypertension    Hyperlipidemia    Restless legs    Past Surgical History:  Procedure Laterality Date   ABDOMINAL HYSTERECTOMY     Family History  Adopted: Yes  Problem Relation Age of Onset   Breast cancer Neg Hx    Social History   Socioeconomic History   Marital status: Widowed    Spouse name: Not on file   Number of children: 5   Years of education: 87   Highest education level: High school graduate  Occupational History   Occupation: retired  Tobacco Use   Smoking  status: Every Day    Current packs/day: 0.50    Average packs/day: 0.5 packs/day for 55.0 years (27.5 ttl pk-yrs)    Types: Cigarettes    Start date: 1970   Smokeless tobacco: Never  Vaping Use   Vaping status: Never Used  Substance and Sexual Activity   Alcohol use: Yes    Comment: 1-2 cocktails less than monthly   Drug use: No   Sexual activity: Not on file  Other Topics Concern   Not on file  Social History Narrative   Not on file   Social Drivers of Health   Financial Resource Strain: Low Risk  (07/12/2023)   Overall Financial Resource Strain (CARDIA)    Difficulty of Paying Living Expenses: Not hard at all  Food Insecurity: No Food Insecurity (07/12/2023)   Hunger Vital Sign    Worried About Running Out of Food in the Last Year: Never true    Ran Out of Food in the Last Year: Never true  Transportation Needs: No Transportation Needs (07/12/2023)   PRAPARE - Administrator, Civil Service (Medical): No    Lack of Transportation (Non-Medical): No  Physical Activity: Inactive (07/12/2023)   Exercise Vital Sign    Days of Exercise per Week: 0 days    Minutes of Exercise per Session: 0 min  Stress: No Stress Concern Present (07/12/2023)   [employer] of Occupational Health - Occupational Stress Questionnaire    Feeling of Stress : Not at all  Social Connections: Socially Isolated (07/12/2023)   Social Connection and Isolation Panel [NHANES]    Frequency of Communication with Friends and Family: More than three times a week    Frequency of Social Gatherings with Friends and Family: More than three times a week    Attends Religious Services: Never    Database Administrator or Organizations: No    Attends Banker Meetings: Never    Marital Status: Widowed    Tobacco Counseling Ready to quit: Not Answered Counseling given: Not Answered   Clinical Intake:  Pre-visit preparation completed: Yes  Pain : No/denies pain     BMI - recorded:  29.23 Nutritional Status: BMI 25 -29 Overweight Nutritional Risks: None Diabetes: No  How often do you need to have someone help you when you read instructions, pamphlets, or other written materials from your doctor or pharmacy?: 1 - Never  Interpreter Needed?: No  Information entered by :: Vina Ned, CMA   Activities of Daily Living    07/12/2023   11:20 AM 07/12/2023   10:55 AM  In your present state of health, do you have any difficulty performing the following activities:  Hearing? 0 0  Vision? 0 0  Difficulty concentrating or making decisions? 0 0  Walking or climbing stairs? 0 0  Dressing or bathing? 0 0  Doing errands, shopping? 0 0  Preparing Food and eating ? N N  Using the Toilet? N N  In the past six months, have you accidently leaked urine? N N  Do you have problems with loss of bowel control? N N  Managing your Medications? N N  Managing your Finances? N N  Housekeeping or managing your Housekeeping? N N    Patient Care Team: Cannady, Jolene T, NP as PCP - General (Nurse Practitioner) Chipper Viviane POUR, MD as Referring Physician (Psychiatry)  Indicate any recent Medical Services you may have received from other than [facility] providers in the past year (date may be approximate).     Assessment:   This is a routine wellness examination for Aza.  Hearing/Vision screen Hearing Screening - Comments:: Denies hearing loss Vision Screening - Comments:: Gets eye exams   Goals Addressed             This Visit's Progress    Exercise 3x per week (30 min per time)        Depression Screen    07/12/2023   11:30 AM 05/24/2023   10:42 AM 05/11/2023   10:53 AM 11/08/2022   10:46 AM 06/20/2022    1:08 PM 05/10/2022   10:14 AM 12/06/2021    1:18 PM  PHQ 2/9 Scores  PHQ - 2 Score 0 0 0 0 0 0 0  PHQ- 9 Score 0 0 0 0 0 0 0    Fall Risk    07/12/2023   11:34 AM 07/12/2023   10:55 AM 06/20/2022    1:01 PM 05/10/2022   10:14 AM 11/21/2021    2:32 PM  Fall Risk   Falls  in the past year? 0 0 0 0 0  Number falls in past yr: 0 0 0 0 0  Injury with Fall? 0 0 0 0 0  Risk for fall due to : No Fall Risks   No Fall Risks No Fall Risks  Follow up Falls prevention discussed  Falls evaluation completed;Education provided;Falls prevention discussed Falls evaluation completed Falls evaluation completed    MEDICARE RISK AT HOME: Medicare Risk at Home Any stairs in or around the home?: (Patient-Rptd) No If so, are there any without handrails?: No Home free of loose throw rugs in walkways, pet beds, electrical cords, etc?: (Patient-Rptd) Yes Adequate lighting in your home to reduce risk of falls?: (Patient-Rptd) Yes Life alert?: (Patient-Rptd) No Use of a cane, walker or w/c?: (Patient-Rptd) No Grab bars in the bathroom?: (Patient-Rptd) No Shower chair or bench in shower?: (Patient-Rptd) No Elevated toilet seat or a handicapped toilet?: (Patient-Rptd) No  TIMED UP AND GO:  Was the test performed?  No    Cognitive Function:        07/12/2023   11:35 AM 06/20/2022    1:05 PM 05/31/2020    1:05 PM 01/23/2019    8:57 AM 01/18/2018    9:06 AM  6CIT Screen  What Year? 0 points 0 points 0 points 0 points 0 points  What month? 0 points 0 points 0 points 0 points 0 points  What time? 0 points 0 points 0 points 0 points 0 points  Count back from 20 0 points 0 points 0 points 0 points 0 points  Months in reverse 0 points 0 points 0 points 0 points 0 points  Repeat phrase 0 points 0 points 0 points 0 points 0 points  Total Score 0 points 0 points 0 points 0 points 0 points  Immunizations Immunization History  Administered Date(s) Administered   Fluad Quad(high Dose 65+) 05/09/2019, 06/28/2020, 05/10/2022   Influenza, High Dose Seasonal PF 06/11/2017, 03/11/2023   Influenza,inj,Quad PF,6+ Mos 06/17/2015, 05/29/2016, 03/07/2018   Moderna SARS-COV2 Booster Vaccination 07/07/2020   Moderna Sars-Covid-2 Vaccination 09/08/2019, 10/07/2019   Pneumococcal Conjugate-13  06/11/2018   Pneumococcal Polysaccharide-23 06/11/2017   Td 12/13/2006, 01/09/2017   Zoster Recombinant(Shingrix) 03/13/2018, 03/11/2023   Zoster, Live 07/07/2013    TDAP status: Up to date  Flu Vaccine status: Up to date  Pneumococcal vaccine status: Up to date  Covid-19 vaccine status: Declined, Education has been provided regarding the importance of this vaccine but patient still declined. Advised may receive this vaccine at local pharmacy or Health Dept.or vaccine clinic. Aware to provide a copy of the vaccination record if obtained from local pharmacy or Health Dept. Verbalized acceptance and understanding.  Qualifies for Shingles Vaccine? Yes   Zostavax completed Yes   Shingrix Completed?: Yes  Screening Tests Health Maintenance  Topic Date Due   COVID-19 Vaccine (4 - 2024-25 season) 03/11/2023   Lung Cancer Screening  11/21/2023   MAMMOGRAM  01/24/2024   Medicare Annual Wellness (AWV)  07/11/2024   Fecal DNA (Cologuard)  06/01/2026   DTaP/Tdap/Td (3 - Tdap) 01/10/2027   DEXA SCAN  04/10/2027   Pneumonia Vaccine 28+ Years old  Completed   INFLUENZA VACCINE  Completed   Hepatitis C Screening  Completed   Zoster Vaccines- Shingrix  Completed   HPV VACCINES  Aged Out    Health Maintenance  Health Maintenance Due  Topic Date Due   COVID-19 Vaccine (4 - 2024-25 season) 03/11/2023    Colorectal cancer screening: Type of screening: Cologuard. Completed 06/02/23. Repeat every 3 years  Mammogram status: Ordered 07/12/23. Pt provided with contact info and advised to call to schedule appt.   Bone Density status: Completed 04/09/17. Results reflect: Bone density results: NORMAL. Repeat every 10 years.  Lung Cancer Screening: (Low Dose CT Chest recommended if Age 34-80 years, 20 pack-year currently smoking OR have quit w/in 15years.) does qualify.   Lung Cancer Screening Referral: 11/26/22  Additional Screening:  Hepatitis C Screening: does not qualify; Completed  11/16/15  Vision Screening: Recommended annual ophthalmology exams for early detection of glaucoma and other disorders of the eye.  Dental Screening: Recommended annual dental exams for proper oral hygiene   Community Resource Referral / Chronic Care Management: CRR required this visit?  No   CCM required this visit?  No     Plan:     I have personally reviewed and noted the following in the patient's chart:   Medical and social history Use of alcohol, tobacco or illicit drugs  Current medications and supplements including opioid prescriptions. Patient is not currently taking opioid prescriptions. Functional ability and status Nutritional status Physical activity Advanced directives List of other physicians Hospitalizations, surgeries, and ER visits in previous 12 months Vitals Screenings to include cognitive, depression, and falls Referrals and appointments  In addition, I have reviewed and discussed with patient certain preventive protocols, quality metrics, and best practice recommendations. A written personalized care plan for preventive services as well as general preventive health recommendations were provided to patient.     Vina Ned, CMA   07/12/2023   After Visit Summary: (MyChart) Due to this being a telephonic visit, the after visit summary with patients personalized plan was offered to patient via MyChart   Nurse Notes:  Declined Covid vaccine Placed order for [facility] Gave phone #  to call and schedule LDCT due ~11/21/23

## 2023-09-05 ENCOUNTER — Ambulatory Visit
Admission: RE | Admit: 2023-09-05 | Discharge: 2023-09-05 | Disposition: A | Discharge status: Home / Self Care | Payer: Medicare Other | Source: Ambulatory Visit | Attending: Nurse Practitioner | Admitting: Nurse Practitioner

## 2023-09-05 DIAGNOSIS — Z1231 Encounter for screening mammogram for malignant neoplasm of breast: Secondary | ICD-10-CM | POA: Diagnosis not present

## 2023-11-10 NOTE — Patient Instructions (Signed)
 Be Involved in Caring For Your Health:  Taking Medications When medications are taken as directed, they can greatly improve your health. But if they are not taken as prescribed, they may not work. In some cases, not taking them correctly can be harmful. To help ensure your treatment remains effective and safe, understand your medications and how to take them. Bring your medications to each visit for review by your provider.  Your lab results, notes, and after visit summary will be available on My Chart. We strongly encourage you to use this feature. If lab results are abnormal the clinic will contact you with the appropriate steps. If the clinic does not contact you assume the results are satisfactory. You can always view your results on My Chart. If you have questions regarding your health or results, please contact the clinic during office hours. You can also ask questions on My Chart.  We at [facility] are grateful that you chose [place] to provide your care. We strive to provide evidence-based and compassionate care and are always looking for feedback. If you get a survey from the clinic please complete this so we can hear your opinions.  Heart-Healthy Eating Plan Many factors influence your heart health, including eating and exercise habits. Heart health is also called coronary health. Coronary risk increases with abnormal blood fat (lipid) levels. A heart-healthy eating plan includes limiting unhealthy fats, increasing healthy fats, limiting salt (sodium) intake, and making other diet and lifestyle changes. What is my plan? Your health care provider may recommend that: You limit your fat intake to _________% or less of your total calories each day. You limit your saturated fat intake to _________% or less of your total calories each day. You limit the amount of cholesterol in your diet to less than _________ mg per day. You limit the amount of sodium in your diet to less than _________  mg per day. What are tips for following this plan? Cooking Cook foods using methods other than frying. Baking, boiling, grilling, and broiling are all good options. Other ways to reduce fat include: Removing the skin from poultry. Removing all visible fats from meats. Steaming vegetables in water or broth. Meal planning  At meals, imagine dividing your plate into fourths: Fill one-half of your plate with vegetables and green salads. Fill one-fourth of your plate with whole grains. Fill one-fourth of your plate with lean protein foods. Eat 2-4 cups of vegetables per day. One cup of vegetables equals 1 cup (91 g) broccoli or cauliflower florets, 2 medium carrots, 1 large bell pepper, 1 large sweet potato, 1 large tomato, 1 medium white potato, 2 cups (150 g) raw leafy greens. Eat 1-2 cups of fruit per day. One cup of fruit equals 1 small apple, 1 large banana, 1 cup (237 g) mixed fruit, 1 large orange,  cup (82 g) dried fruit, 1 cup (240 mL) 100% fruit juice. Eat more foods that contain soluble fiber. Examples include apples, broccoli, carrots, beans, peas, and barley. Aim to get 25-30 g of fiber per day. Increase your consumption of legumes, nuts, and seeds to 4-5 servings per week. One serving of dried beans or legumes equals  cup (90 g) cooked, 1 serving of nuts is  oz (12 almonds, 24 pistachios, or 7 walnut halves), and 1 serving of seeds equals  oz (8 g). Fats Choose healthy fats more often. Choose monounsaturated and polyunsaturated fats, such as olive and canola oils, avocado oil, flaxseeds, walnuts, almonds, and seeds. Eat  more omega-3 fats. Choose salmon, mackerel, sardines, tuna, flaxseed oil, and ground flaxseeds. Aim to eat fish at least 2 times each week. Check food labels carefully to identify foods with trans fats or high amounts of saturated fat. Limit saturated fats. These are found in animal products, such as meats, butter, and cream. Plant sources of saturated fats  include palm oil, palm kernel oil, and coconut oil. Avoid foods with partially hydrogenated oils in them. These contain trans fats. Examples are stick margarine, some tub margarines, cookies, crackers, and other baked goods. Avoid fried foods. General information Eat more home-cooked food and less restaurant, buffet, and fast food. Limit or avoid alcohol. Limit foods that are high in added sugar and simple starches such as foods made using white refined flour (white breads, pastries, sweets). Lose weight if you are overweight. Losing just 5-10% of your body weight can help your overall health and prevent diseases such as diabetes and heart disease. Monitor your sodium intake, especially if you have high blood pressure. Talk with your health care provider about your sodium intake. Try to incorporate more vegetarian meals weekly. What foods should I eat? Fruits All fresh, canned (in natural juice), or frozen fruits. Vegetables Fresh or frozen vegetables (raw, steamed, roasted, or grilled). Green salads. Grains Most grains. Choose whole wheat and whole grains most of the time. Rice and pasta, including brown rice and pastas made with whole wheat. Meats and other proteins Lean, well-trimmed beef, veal, pork, and lamb. Chicken and [place] without skin. All fish and shellfish. Wild duck, rabbit, pheasant, and venison. Egg whites or low-cholesterol egg substitutes. Dried beans, peas, lentils, and tofu. Seeds and most nuts. Dairy Low-fat or nonfat cheeses, including ricotta and mozzarella. Skim or 1% milk (liquid, powdered, or evaporated). Buttermilk made with low-fat milk. Nonfat or low-fat yogurt. Fats and oils Non-hydrogenated (trans-free) margarines. Vegetable oils, including soybean, sesame, sunflower, olive, avocado, peanut, safflower, corn, canola, and cottonseed. Salad dressings or mayonnaise made with a vegetable oil. Beverages Water (mineral or sparkling). Coffee and tea. Unsweetened ice  tea. Diet beverages. Sweets and desserts Sherbet, gelatin, and fruit ice. Small amounts of dark chocolate. Limit all sweets and desserts. Seasonings and condiments All seasonings and condiments. The items listed above may not be a complete list of foods and beverages you can eat. Contact a dietitian for more options. What foods should I avoid? Fruits Canned fruit in heavy syrup. Fruit in cream or butter sauce. Fried fruit. Limit coconut. Vegetables Vegetables cooked in cheese, cream, or butter sauce. Fried vegetables. Grains Breads made with saturated or trans fats, oils, or whole milk. Croissants. Sweet rolls. Donuts. High-fat crackers, such as cheese crackers and chips. Meats and other proteins Fatty meats, such as hot dogs, ribs, sausage, bacon, rib-eye roast or steak. High-fat deli meats, such as salami and bologna. Caviar. Domestic duck and goose. Organ meats, such as liver. Dairy Cream, sour cream, cream cheese, and creamed cottage cheese. Whole-milk cheeses. Whole or 2% milk (liquid, evaporated, or condensed). Whole buttermilk. Cream sauce or high-fat cheese sauce. Whole-milk yogurt. Fats and oils Meat fat, or shortening. Cocoa butter, hydrogenated oils, palm oil, coconut oil, palm kernel oil. Solid fats and shortenings, including bacon fat, salt pork, lard, and butter. Nondairy cream substitutes. Salad dressings with cheese or sour cream. Beverages Regular sodas and any drinks with added sugar. Sweets and desserts Frosting. Pudding. Cookies. Cakes. Pies. Milk chocolate or white chocolate. Buttered syrups. Full-fat ice cream or ice cream drinks. The items listed above may  not be a complete list of foods and beverages to avoid. Contact a dietitian for more information. Summary Heart-healthy meal planning includes limiting unhealthy fats, increasing healthy fats, limiting salt (sodium) intake and making other diet and lifestyle changes. Lose weight if you are overweight. Losing just  5-10% of your body weight can help your overall health and prevent diseases such as diabetes and heart disease. Focus on eating a balance of foods, including fruits and vegetables, low-fat or nonfat dairy, lean protein, nuts and legumes, whole grains, and heart-healthy oils and fats. This information is not intended to replace advice given to you by your health care provider. Make sure you discuss any questions you have with your health care provider. Document Revised: 08/01/2021 Document Reviewed: 08/01/2021 [employer] Patient Education  2024 [employer].

## 2023-11-12 ENCOUNTER — Encounter: Payer: Self-pay | Admitting: Nurse Practitioner

## 2023-11-12 ENCOUNTER — Ambulatory Visit (INDEPENDENT_AMBULATORY_CARE_PROVIDER_SITE_OTHER): Payer: Self-pay | Admitting: Nurse Practitioner

## 2023-11-12 VITALS — BP 109/69 | HR 88 | Temp 97.9°F | Resp 17 | Ht 62.99 in | Wt 166.8 lb

## 2023-11-12 DIAGNOSIS — I10 Essential (primary) hypertension: Secondary | ICD-10-CM

## 2023-11-12 DIAGNOSIS — Z0000 Encounter for general adult medical examination without abnormal findings: Secondary | ICD-10-CM | POA: Diagnosis not present

## 2023-11-12 DIAGNOSIS — I700 Atherosclerosis of aorta: Secondary | ICD-10-CM

## 2023-11-12 DIAGNOSIS — E063 Autoimmune thyroiditis: Secondary | ICD-10-CM

## 2023-11-12 DIAGNOSIS — J432 Centrilobular emphysema: Secondary | ICD-10-CM

## 2023-11-12 DIAGNOSIS — E782 Mixed hyperlipidemia: Secondary | ICD-10-CM

## 2023-11-12 DIAGNOSIS — F17210 Nicotine dependence, cigarettes, uncomplicated: Secondary | ICD-10-CM

## 2023-11-12 DIAGNOSIS — F3342 Major depressive disorder, recurrent, in full remission: Secondary | ICD-10-CM

## 2023-11-12 DIAGNOSIS — G2581 Restless legs syndrome: Secondary | ICD-10-CM

## 2023-11-12 DIAGNOSIS — E559 Vitamin D deficiency, unspecified: Secondary | ICD-10-CM | POA: Diagnosis not present

## 2023-11-12 DIAGNOSIS — Z6830 Body mass index (BMI) 30.0-30.9, adult: Secondary | ICD-10-CM

## 2023-11-12 DIAGNOSIS — E6609 Other obesity due to excess calories: Secondary | ICD-10-CM

## 2023-11-12 DIAGNOSIS — E66811 Obesity, class 1: Secondary | ICD-10-CM

## 2023-11-12 MED ORDER — LEVOTHYROXINE SODIUM 25 MCG PO TABS: 25.0000 ug | ORAL_TABLET | Freq: Every day | ORAL | 4 refills | Status: AC

## 2023-11-12 MED ORDER — FLUOXETINE HCL 20 MG PO CAPS: 20.0000 mg | ORAL_CAPSULE | Freq: Every day | ORAL | 4 refills | Status: AC

## 2023-11-12 MED ORDER — NYSTATIN 100000 UNIT/GM EX OINT: 1.0000 | TOPICAL_OINTMENT | Freq: Two times a day (BID) | CUTANEOUS | 2 refills | Status: DC

## 2023-11-12 MED ORDER — BUPROPION HCL ER (SR) 100 MG PO TB12: 100.0000 mg | ORAL_TABLET | Freq: Two times a day (BID) | ORAL | 3 refills | Status: AC

## 2023-11-12 MED ORDER — EZETIMIBE 10 MG PO TABS: 10.0000 mg | ORAL_TABLET | Freq: Every day | ORAL | 4 refills | Status: AC

## 2023-11-12 MED ORDER — ROSUVASTATIN CALCIUM 40 MG PO TABS: 40.0000 mg | ORAL_TABLET | Freq: Every day | ORAL | 4 refills | Status: AC

## 2023-11-12 MED ORDER — LOSARTAN POTASSIUM-HCTZ 100-12.5 MG PO TABS: 1.0000 | ORAL_TABLET | Freq: Every day | ORAL | 4 refills | Status: AC

## 2023-11-12 NOTE — Assessment & Plan Note (Signed)
Chronic.  Noted past labs, recommend she continue supplement and adjust as needed.  Check level today.

## 2023-11-12 NOTE — Assessment & Plan Note (Signed)
I have recommended complete cessation of tobacco use. I have discussed various options available for assistance with tobacco cessation including over the counter methods (Nicotine gum, patch and lozenges). We also discussed prescription options (Chantix, Nicotine Inhaler / Nasal Spray). The patient is not interested in pursuing any prescription tobacco cessation options at this time.  Continue annual screening.

## 2023-11-12 NOTE — Assessment & Plan Note (Signed)
 Chronic, stable.  Denies SI/HI.  Continue current medication regimen and adjust as needed.  Will have return to psychiatry as needed if worsening mood presents.

## 2023-11-12 NOTE — Assessment & Plan Note (Signed)
Chronic.  Noted on CT lung screening -- at this time recommend complete cessation of smoking and continue statin therapy for prevention. 

## 2023-11-12 NOTE — Progress Notes (Signed)
 BP 109/69 (BP Location: Left Arm, Patient Position: Sitting, Cuff Size: Normal)   Pulse 88   Temp 97.9 F (36.6 C) (Oral)   Resp 17   Ht 5' 2.99" (1.6 m)   Wt 166 lb 12.8 oz (75.7 kg)   SpO2 95%   BMI 29.55 kg/m    Subjective:    Patient ID: Michelle Espinoza, female    DOB: Mar 25, 1951, 73 y.o.   MRN: [id number]  HPI: Michelle Espinoza is a 73 y.o. female presenting on 11/12/2023 for comprehensive medical examination. Current medical complaints include:none  She currently lives with: self Menopausal Symptoms: no -- had hysterectomy, but still has ovaries  HYPERTENSION / HYPERLIPIDEMIA Continues to take Losartan -HCTZ, Zetia , Rosuvastatin . Satisfied with current treatment? yes Duration of hypertension: chronic BP monitoring frequency: not checking BP range:  BP medication side effects: no Duration of hyperlipidemia: chronic Cholesterol medication side effects: no Cholesterol supplements: none Medication compliance: fair compliance Aspirin: yes Recent stressors: no Recurrent headaches: no Visual changes: no Palpitations: no Dyspnea: no Chest pain: no Lower extremity edema: no Dizzy/lightheaded: no  The ASCVD Risk score (Arnett DK, et al., 2019) failed to calculate for the following reasons:   The valid total cholesterol range is 130 to 320 mg/dL  COPD Does not use inhalers.  Current smoker.  Smokes 1/2 PPD.  Has smoked since she was 17-18.  Lung screening on 11/21/22 noted centrilobular emphysema and aortic atherosclerosis + cholelithiasis.   COPD status: stable Satisfied with current treatment?: yes Oxygen use: no Dyspnea frequency: no Cough frequency: no Rescue inhaler frequency: none Limitation of activity: no Productive cough: none Last Spirometry: 11/12/23 Pneumovax: Up to Date Influenza: Up to Date   HYPOTHYROIDISM Takes Levothyroxine  25 MCG daily. Thyroid  control status: controlled Satisfied with current treatment? yes Medication side effects: no Medication  compliance: fair compliance Etiology of hypothyroidism:  Recent dose adjustment:no Fatigue: no Cold intolerance: no Heat intolerance: no Weight gain: no Weight loss: no Constipation: occasional Diarrhea/loose stools: no Palpitations: no Lower extremity edema: no Anxiety/depressed mood: no   DEPRESSION Taking Wellbutrin 100 MG BID and Prozac  20 MG daily. Followed by Dr. Bradford Cadet in past. Mood status: stable Satisfied with current treatment?: yes Symptom severity: moderate  Duration of current treatment : chronic Side effects: no Medication compliance: good compliance Psychotherapy/counseling: yes current Depressed mood: no Anxious mood: no Anhedonia: no Significant weight loss or gain: no Insomnia: no Fatigue: no Feelings of worthlessness or guilt: no Impaired concentration/indecisiveness: no Suicidal ideations: no Hopelessness: no Crying spells: no    11/12/2023    1:05 PM 07/12/2023   11:30 AM 05/24/2023   10:42 AM 05/11/2023   10:53 AM 11/08/2022   10:46 AM  Depression screen PHQ 2/9  Decreased Interest 0 0 0 0 0  Down, Depressed, Hopeless 0 0 0 0 0  PHQ - 2 Score 0 0 0 0 0  Altered sleeping 0 0 0 0 0  Tired, decreased energy 0 0 0 0 0  Change in appetite 0 0 0 0 0  Feeling bad or failure about yourself  0 0 0 0 0  Trouble concentrating 0 0 0 0 0  Moving slowly or fidgety/restless 0 0 0 0 0  Suicidal thoughts 0 0 0 0 0  PHQ-9 Score 0 0 0 0 0  Difficult doing work/chores     Not difficult at all       11/12/2023    1:05 PM 05/24/2023   10:42 AM 05/11/2023   10:53  AM 11/08/2022   10:47 AM  GAD 7 : Generalized Anxiety Score  Nervous, Anxious, on Edge 0 0 0 0  Control/stop worrying 0 0 0 0  Worry too much - different things 0 0 0 0  Trouble relaxing 0 0 0 0  Restless 0 0 0 0  Easily annoyed or irritable 0 0 0 0  Afraid - awful might happen 0 0 0 0  Total GAD 7 Score 0 0 0 0  Anxiety Difficulty    Not difficult at all       05/10/2022   10:14 AM 06/20/2022     1:01 PM 07/12/2023   10:55 AM 07/12/2023   11:34 AM 11/12/2023    1:05 PM  Fall Risk  Falls in the past year? 0 0 0 0 0  Was there an injury with Fall? 0 0 0 0 0  Fall Risk Category Calculator 0 0 0  0 0  Fall Risk Category (Retired) Low Low     (RETIRED) Patient Fall Risk Level Low fall risk Low fall risk     Patient at Risk for Falls Due to No Fall Risks   No Fall Risks No Fall Risks  Fall risk Follow up Falls evaluation completed Falls evaluation completed;Education provided;Falls prevention discussed  Falls prevention discussed Falls evaluation completed     Patient-reported    Functional Status Survey: Is the patient deaf or have difficulty hearing?: No Does the patient have difficulty seeing, even when wearing glasses/contacts?: No Does the patient have difficulty concentrating, remembering, or making decisions?: No Does the patient have difficulty walking or climbing stairs?: No Does the patient have difficulty dressing or bathing?: No Does the patient have difficulty doing errands alone such as visiting a doctor's office or shopping?: No    Past Medical History:  Past Medical History:  Diagnosis Date   Allergy    Depression    Essential hypertension    Hyperlipidemia    Restless legs     Surgical History:  Past Surgical History:  Procedure Laterality Date   ABDOMINAL HYSTERECTOMY      Medications:  Current Outpatient Medications on File Prior to Visit  Medication Sig   albuterol  (VENTOLIN  HFA) 108 (90 Base) MCG/ACT inhaler Inhale 2 puffs into the lungs every 6 (six) hours as needed.   aspirin EC 81 MG tablet Take 81 mg by mouth daily.   cyanocobalamin  2000 MCG tablet Take 2,000 mcg by mouth daily.   ketoconazole  (NIZORAL ) 2 % shampoo Apply 1 Application topically 2 (two) times a week.   loratadine (CLARITIN REDITABS) 10 MG dissolvable tablet Take 10 mg by mouth daily.   Magnesium Hydroxide (MAGNESIA PO) Take by mouth.   Multiple Vitamin (MULTIVITAMIN) tablet Take 1  tablet by mouth daily.   Multiple Vitamins-Minerals (VITAMIN D3 COMPLETE PO) Take by mouth.   No current facility-administered medications on file prior to visit.    Allergies:  Allergies  Allergen Reactions   Lovastatin     Elevated LFT   Penicillins Rash    Social History:  Social History   Socioeconomic History   Marital status: Widowed    Spouse name: Not on file   Number of children: 5   Years of education: 34   Highest education level: High school graduate  Occupational History   Occupation: retired  Tobacco Use   Smoking status: Every Day    Current packs/day: 0.50    Average packs/day: 0.5 packs/day for 55.3 years (27.7 ttl pk-yrs)  Types: Cigarettes    Start date: 1970   Smokeless tobacco: Never  Vaping Use   Vaping status: Never Used  Substance and Sexual Activity   Alcohol use: Yes    Comment: 1-2 cocktails less than monthly   Drug use: No   Sexual activity: Not on file  Other Topics Concern   Not on file  Social History Narrative   Not on file   Social Drivers of Health   Financial Resource Strain: Low Risk  (07/12/2023)   Overall Financial Resource Strain (CARDIA)    Difficulty of Paying Living Expenses: Not hard at all  Food Insecurity: No Food Insecurity (07/12/2023)   Hunger Vital Sign    Worried About Running Out of Food in the Last Year: Never true    Ran Out of Food in the Last Year: Never true  Transportation Needs: No Transportation Needs (07/12/2023)   PRAPARE - Administrator, Civil Service (Medical): No    Lack of Transportation (Non-Medical): No  Physical Activity: Inactive (07/12/2023)   Exercise Vital Sign    Days of Exercise per Week: 0 days    Minutes of Exercise per Session: 0 min  Stress: No Stress Concern Present (07/12/2023)   [employer] of Occupational Health - Occupational Stress Questionnaire    Feeling of Stress : Not at all  Social Connections: Socially Isolated (07/12/2023)   Social Connection and  Isolation Panel [NHANES]    Frequency of Communication with Friends and Family: More than three times a week    Frequency of Social Gatherings with Friends and Family: More than three times a week    Attends Religious Services: Never    Database administrator or Organizations: No    Attends Banker Meetings: Never    Marital Status: Widowed  Intimate Partner Violence: Not At Risk (07/12/2023)   Humiliation, Afraid, Rape, and Kick questionnaire    Fear of Current or Ex-Partner: No    Emotionally Abused: No    Physically Abused: No    Sexually Abused: No   Social History   Tobacco Use  Smoking Status Every Day   Current packs/day: 0.50   Average packs/day: 0.5 packs/day for 55.3 years (27.7 ttl pk-yrs)   Types: Cigarettes   Start date: 1970  Smokeless Tobacco Never   Social History   Substance and Sexual Activity  Alcohol Use Yes   Comment: 1-2 cocktails less than monthly    Family History:  Family History  Adopted: Yes  Problem Relation Age of Onset   Breast cancer Neg Hx     Past medical history, surgical history, medications, allergies, family history and social history reviewed with patient today and changes made to appropriate areas of the chart.   ROS All other ROS negative except what is listed above and in the HPI.      Objective:    BP 109/69 (BP Location: Left Arm, Patient Position: Sitting, Cuff Size: Normal)   Pulse 88   Temp 97.9 F (36.6 C) (Oral)   Resp 17   Ht 5' 2.99" (1.6 m)   Wt 166 lb 12.8 oz (75.7 kg)   SpO2 95%   BMI 29.55 kg/m   Wt Readings from Last 3 Encounters:  11/12/23 166 lb 12.8 oz (75.7 kg)  07/12/23 165 lb (74.8 kg)  05/24/23 169 lb 9.6 oz (76.9 kg)    Physical Exam Vitals and nursing note reviewed. Exam conducted with a chaperone present.  Constitutional:  General: She is awake. She is not in acute distress.    Appearance: She is well-developed and well-groomed. She is not ill-appearing or toxic-appearing.   HENT:     Head: Normocephalic and atraumatic.     Right Ear: Hearing, tympanic membrane, ear canal and external ear normal. No drainage.     Left Ear: Hearing, tympanic membrane, ear canal and external ear normal. No drainage.     Nose: Nose normal.     Right Sinus: No maxillary sinus tenderness or frontal sinus tenderness.     Left Sinus: No maxillary sinus tenderness or frontal sinus tenderness.     Mouth/Throat:     Mouth: Mucous membranes are moist.     Pharynx: Oropharynx is clear. Uvula midline. No pharyngeal swelling, oropharyngeal exudate or posterior oropharyngeal erythema.  Eyes:     General: Lids are normal.        Right eye: No discharge.        Left eye: No discharge.     Extraocular Movements: Extraocular movements intact.     Conjunctiva/sclera: Conjunctivae normal.     Pupils: Pupils are equal, round, and reactive to light.     Visual Fields: Right eye visual fields normal and left eye visual fields normal.  Neck:     Thyroid : No thyromegaly.     Vascular: No carotid bruit.     Trachea: Trachea normal.  Cardiovascular:     Rate and Rhythm: Normal rate and regular rhythm.     Heart sounds: Normal heart sounds. No murmur heard.    No gallop.  Pulmonary:     Effort: Pulmonary effort is normal. No accessory muscle usage or respiratory distress.     Breath sounds: Normal breath sounds.  Chest:  Breasts:    Right: Normal.     Left: Normal.  Abdominal:     General: Bowel sounds are normal.     Palpations: Abdomen is soft. There is no hepatomegaly or splenomegaly.     Tenderness: There is no abdominal tenderness.  Musculoskeletal:        General: Normal range of motion.     Cervical back: Normal range of motion and neck supple.     Right lower leg: No edema.     Left lower leg: No edema.  Lymphadenopathy:     Head:     Right side of head: No submental, submandibular, tonsillar, preauricular or posterior auricular adenopathy.     Left side of head: No submental,  submandibular, tonsillar, preauricular or posterior auricular adenopathy.     Cervical: No cervical adenopathy.     Upper Body:     Right upper body: No supraclavicular, axillary or pectoral adenopathy.     Left upper body: No supraclavicular, axillary or pectoral adenopathy.  Skin:    General: Skin is warm and dry.     Capillary Refill: Capillary refill takes less than 2 seconds.     Findings: No rash.  Neurological:     Mental Status: She is alert and oriented to person, place, and time.     Gait: Gait is intact.     Deep Tendon Reflexes: Reflexes are normal and symmetric.     Reflex Scores:      Brachioradialis reflexes are 2+ on the right side and 2+ on the left side.      Patellar reflexes are 2+ on the right side and 2+ on the left side. Psychiatric:        Attention and Perception: Attention  normal.        Mood and Affect: Mood normal.        Speech: Speech normal.        Behavior: Behavior normal. Behavior is cooperative.        Thought Content: Thought content normal.        Judgment: Judgment normal.     Results for orders placed or performed in visit on 05/11/23  Comprehensive metabolic panel   Collection Time: 05/11/23 10:56 AM  Result Value Ref Range   Glucose 91 70 - 99 mg/dL   BUN 15 8 - 27 mg/dL   Creatinine, Ser 0.98 0.57 - 1.00 mg/dL   eGFR 80 >11 BJ/YNW/2.95   BUN/Creatinine Ratio 19 12 - 28   Sodium 142 134 - 144 mmol/L   Potassium 4.1 3.5 - 5.2 mmol/L   Chloride 104 96 - 106 mmol/L   CO2 24 20 - 29 mmol/L   Calcium  9.0 8.7 - 10.3 mg/dL   Total Protein 6.5 6.0 - 8.5 g/dL   Albumin 4.2 3.8 - 4.8 g/dL   Globulin, Total 2.3 1.5 - 4.5 g/dL   Bilirubin Total 0.4 0.0 - 1.2 mg/dL   Alkaline Phosphatase 82 44 - 121 IU/L   AST 24 0 - 40 IU/L   ALT 19 0 - 32 IU/L  Lipid Panel w/o Chol/HDL Ratio   Collection Time: 05/11/23 10:56 AM  Result Value Ref Range   Cholesterol, Total 114 100 - 199 mg/dL   Triglycerides 96 0 - 149 mg/dL   HDL 46 >62 mg/dL   VLDL  Cholesterol Cal 18 5 - 40 mg/dL   LDL Chol Calc (NIH) 50 0 - 99 mg/dL  VITAMIN D  25 Hydroxy (Vit-D Deficiency, Fractures)   Collection Time: 05/11/23 10:56 AM  Result Value Ref Range   Vit D, 25-Hydroxy 23.8 (L) 30.0 - 100.0 ng/mL  Novel Coronavirus, NAA ([facility])   Collection Time: 05/11/23  2:22 PM   Specimen: Nasopharyngeal(NP) swabs in vial transport medium  Result Value Ref Range   SARS-CoV-2, NAA Not Detected Not Detected  Cologuard   Collection Time: 06/02/23  7:36 AM  Result Value Ref Range   COLOGUARD Negative Negative      Assessment & Plan:   Problem List Items Addressed This Visit       Cardiovascular and Mediastinum   Essential hypertension   Chronic, ongoing.  BP at goal today in office  Recommend she monitor BP at least a few mornings a week at home and document.  DASH diet at home.  Continue current medication regimen and adjust as needed.  Labs today: CBC, TSH, CMP.  Return in 6 months.        Relevant Medications   ezetimibe  (ZETIA ) 10 MG tablet   losartan -hydrochlorothiazide  (HYZAAR) 100-12.5 MG tablet   rosuvastatin  (CRESTOR ) 40 MG tablet   Other Relevant Orders   Comprehensive metabolic panel with GFR   Aortic atherosclerosis (HCC)   Chronic.  Noted on CT lung screening -- at this time recommend complete cessation of smoking and continue statin therapy for prevention.      Relevant Medications   ezetimibe  (ZETIA ) 10 MG tablet   losartan -hydrochlorothiazide  (HYZAAR) 100-12.5 MG tablet   rosuvastatin  (CRESTOR ) 40 MG tablet   Other Relevant Orders   Comprehensive metabolic panel with GFR   Lipid Panel w/o Chol/HDL Ratio     Respiratory   Centrilobular emphysema (HCC) - Primary   Chronic.  Ongoing in long time smoker and noted on CT  lung screening.  Recommend complete cessation of smoking.  No current inhalers. Spirometry with levels remain stable - 74% and 74%.  Educated her at length on emphysema.  Start inhalers as needed in future.  Albuterol  as  needed.      Relevant Orders   CBC with Differential/Platelet   Spirometry with Graph (Completed)     Endocrine   Hashimoto's thyroiditis   Chronic, ongoing.  Continue Levothyroxine  25 MCG daily.  Continue current medication regimen and adjust as needed.  Labs today.      Relevant Medications   levothyroxine  (SYNTHROID ) 25 MCG tablet   Other Relevant Orders   TSH   T4, free     Other   Vitamin D  deficiency   Chronic.  Noted past labs, recommend she continue supplement and adjust as needed.  Check level today.      Relevant Orders   VITAMIN D  25 Hydroxy (Vit-D Deficiency, Fractures)   Recurrent major depressive disorder, in full remission (HCC)   Chronic, stable.  Denies SI/HI.  Continue current medication regimen and adjust as needed.  Will have return to psychiatry as needed if worsening mood presents.      Relevant Medications   buPROPion ER (WELLBUTRIN SR) 100 MG 12 hr tablet   FLUoxetine  (PROZAC ) 20 MG capsule   Obesity   BMI 29.55.  Recommended eating smaller high protein, low fat meals more frequently and exercising 30 mins a day 5 times a week with a goal of 10-15lb weight loss in the next 3 months. Patient voiced their understanding and motivation to adhere to these recommendations.       Nicotine dependence, cigarettes, uncomplicated   I have recommended complete cessation of tobacco use. I have discussed various options available for assistance with tobacco cessation including over the counter methods (Nicotine gum, patch and lozenges). We also discussed prescription options (Chantix, Nicotine Inhaler / Nasal Spray). The patient is not interested in pursuing any prescription tobacco cessation options at this time.  Continue annual screening.       Hyperlipidemia   Chronic, stable.  Continue current medication regimen and adjust as needed.  Lipid panel today.      Relevant Medications   ezetimibe  (ZETIA ) 10 MG tablet   losartan -hydrochlorothiazide  (HYZAAR)  100-12.5 MG tablet   rosuvastatin  (CRESTOR ) 40 MG tablet   Other Relevant Orders   Comprehensive metabolic panel with GFR   Lipid Panel w/o Chol/HDL Ratio   Other Visit Diagnoses       Encounter for annual physical exam       Annual physical today with labs and health maintenance reviewed, discussed with patient.        Follow up plan: Return in about 6 months (around 05/14/2024) for HTN/HLD, COPD, Depression, Hypothyroid.   LABORATORY TESTING:  - Pap smear: not applicable  IMMUNIZATIONS:   - Tdap: Tetanus vaccination status reviewed: last tetanus booster within 10 years. - Influenza: Up to date - Pneumovax: Up to date - Prevnar: Up to date - COVID: Up to date - HPV: Not applicable - Shingrix vaccine: Up to date   SCREENING: -Mammogram: Up to date  - Colonoscopy: Up to date Cologuard due next 06/01/2026 - Bone Density: Up to date  -Hearing Test: Not applicable  -Spirometry: Up to date   PATIENT COUNSELING:   Advised to take 1 mg of folate supplement per day if capable of pregnancy.   Sexuality: Discussed sexually transmitted diseases, partner selection, use of condoms, avoidance of unintended pregnancy  and  contraceptive alternatives.   Advised to avoid cigarette smoking.  I discussed with the patient that most people either abstain from alcohol or drink within safe limits (<=14/week and <=4 drinks/occasion for males, <=7/weeks and <= 3 drinks/occasion for females) and that the risk for alcohol disorders and other health effects rises proportionally with the number of drinks per week and how often a drinker exceeds daily limits.  Discussed cessation/primary prevention of drug use and availability of treatment for abuse.   Diet: Encouraged to adjust caloric intake to maintain  or achieve ideal body weight, to reduce intake of dietary saturated fat and total fat, to limit sodium intake by avoiding high sodium foods and not adding table salt, and to maintain adequate  dietary potassium and calcium  preferably from fresh fruits, vegetables, and low-fat dairy products.    Stressed the importance of regular exercise  Injury prevention: Discussed safety belts, safety helmets, smoke detector, smoking near bedding or upholstery.   Dental health: Discussed importance of regular tooth brushing, flossing, and dental visits.    NEXT PREVENTATIVE PHYSICAL DUE IN 1 YEAR. Return in about 6 months (around 05/14/2024) for HTN/HLD, COPD, Depression, Hypothyroid.

## 2023-11-12 NOTE — Assessment & Plan Note (Signed)
 Chronic, ongoing.  Continue Levothyroxine  25 MCG daily.  Continue current medication regimen and adjust as needed.  Labs today.

## 2023-11-12 NOTE — Assessment & Plan Note (Signed)
 Chronic, stable.  Continue current medication regimen and adjust as needed.  Lipid panel today.

## 2023-11-12 NOTE — Assessment & Plan Note (Signed)
 Chronic, ongoing.  BP at goal today in office  Recommend she monitor BP at least a few mornings a week at home and document.  DASH diet at home.  Continue current medication regimen and adjust as needed.  Labs today: CBC, TSH, CMP.  Return in 6 months.

## 2023-11-12 NOTE — Assessment & Plan Note (Signed)
 Chronic.  Ongoing in long time smoker and noted on CT lung screening.  Recommend complete cessation of smoking.  No current inhalers. Spirometry with levels remain stable - 74% and 74%.  Educated her at length on emphysema.  Start inhalers as needed in future.  Albuterol  as needed.

## 2023-11-12 NOTE — Assessment & Plan Note (Signed)
BMI 29.55.  Recommended eating smaller high protein, low fat meals more frequently and exercising 30 mins a day 5 times a week with a goal of 10-15lb weight loss in the next 3 months. Patient voiced their understanding and motivation to adhere to these recommendations.

## 2023-11-13 ENCOUNTER — Encounter: Payer: Self-pay | Admitting: Nurse Practitioner

## 2023-11-13 LAB — CBC WITH DIFFERENTIAL/PLATELET
Basophils Absolute: 0.1 10*3/uL (ref 0.0–0.2)
Basos: 1 %
EOS (ABSOLUTE): 0.5 10*3/uL — ABNORMAL HIGH (ref 0.0–0.4)
Eos: 6 %
Hematocrit: 40.9 % (ref 34.0–46.6)
Hemoglobin: 13.6 g/dL (ref 11.1–15.9)
Immature Grans (Abs): 0 10*3/uL (ref 0.0–0.1)
Immature Granulocytes: 0 %
Lymphocytes Absolute: 2.1 10*3/uL (ref 0.7–3.1)
Lymphs: 28 %
MCH: 31.2 pg (ref 26.6–33.0)
MCHC: 33.3 g/dL (ref 31.5–35.7)
MCV: 94 fL (ref 79–97)
Monocytes Absolute: 0.5 10*3/uL (ref 0.1–0.9)
Monocytes: 7 %
Neutrophils Absolute: 4.1 10*3/uL (ref 1.4–7.0)
Neutrophils: 58 %
Platelets: 323 10*3/uL (ref 150–450)
RBC: 4.36 x10E6/uL (ref 3.77–5.28)
RDW: 12.3 % (ref 11.7–15.4)
WBC: 7.2 10*3/uL (ref 3.4–10.8)

## 2023-11-13 LAB — LIPID PANEL W/O CHOL/HDL RATIO
Cholesterol, Total: 136 mg/dL (ref 100–199)
HDL: 49 mg/dL (ref >39)
LDL Chol Calc (NIH): 60 mg/dL (ref 0–99)
Triglycerides: 160 mg/dL — ABNORMAL HIGH (ref 0–149)
VLDL Cholesterol Cal: 27 mg/dL (ref 5–40)

## 2023-11-13 LAB — COMPREHENSIVE METABOLIC PANEL WITH GFR
ALT: 21 IU/L (ref 0–32)
AST: 24 IU/L (ref 0–40)
Albumin: 4.2 g/dL (ref 3.8–4.8)
Alkaline Phosphatase: 82 IU/L (ref 44–121)
BUN/Creatinine Ratio: 22 (ref 12–28)
BUN: 17 mg/dL (ref 8–27)
Bilirubin Total: 0.3 mg/dL (ref 0.0–1.2)
CO2: 22 mmol/L (ref 20–29)
Calcium: 9.3 mg/dL (ref 8.7–10.3)
Chloride: 105 mmol/L (ref 96–106)
Creatinine, Ser: 0.79 mg/dL (ref 0.57–1.00)
Globulin, Total: 2.3 g/dL (ref 1.5–4.5)
Glucose: 106 mg/dL — ABNORMAL HIGH (ref 70–99)
Potassium: 4.2 mmol/L (ref 3.5–5.2)
Sodium: 142 mmol/L (ref 134–144)
Total Protein: 6.5 g/dL (ref 6.0–8.5)
eGFR: 79 mL/min/{1.73_m2} (ref >59)

## 2023-11-13 LAB — T4, FREE: Free T4: 1.21 ng/dL (ref 0.82–1.77)

## 2023-11-13 LAB — VITAMIN D 25 HYDROXY (VIT D DEFICIENCY, FRACTURES): Vit D, 25-Hydroxy: 21.8 ng/mL — ABNORMAL LOW (ref 30.0–100.0)

## 2023-11-13 LAB — TSH: TSH: 3.67 u[IU]/mL (ref 0.450–4.500)

## 2023-11-19 DIAGNOSIS — F17210 Nicotine dependence, cigarettes, uncomplicated: Secondary | ICD-10-CM | POA: Diagnosis not present

## 2023-11-19 DIAGNOSIS — I10 Essential (primary) hypertension: Secondary | ICD-10-CM | POA: Diagnosis not present

## 2023-11-19 DIAGNOSIS — J432 Centrilobular emphysema: Secondary | ICD-10-CM | POA: Diagnosis not present

## 2024-01-15 ENCOUNTER — Ambulatory Visit: Payer: Self-pay

## 2024-01-15 ENCOUNTER — Other Ambulatory Visit: Payer: Self-pay

## 2024-01-15 ENCOUNTER — Emergency Department
Admission: EM | Admit: 2024-01-15 | Discharge: 2024-01-15 | Disposition: A | Discharge status: Home / Self Care | Attending: Emergency Medicine | Admitting: Emergency Medicine

## 2024-01-15 DIAGNOSIS — I10 Essential (primary) hypertension: Secondary | ICD-10-CM | POA: Diagnosis not present

## 2024-01-15 DIAGNOSIS — D72829 Elevated white blood cell count, unspecified: Secondary | ICD-10-CM | POA: Insufficient documentation

## 2024-01-15 DIAGNOSIS — F17200 Nicotine dependence, unspecified, uncomplicated: Secondary | ICD-10-CM | POA: Diagnosis not present

## 2024-01-15 DIAGNOSIS — R310 Gross hematuria: Secondary | ICD-10-CM | POA: Insufficient documentation

## 2024-01-15 DIAGNOSIS — R319 Hematuria, unspecified: Secondary | ICD-10-CM | POA: Diagnosis not present

## 2024-01-15 LAB — CBC WITH DIFFERENTIAL/PLATELET
Abs Immature Granulocytes: 0.04 K/uL (ref 0.00–0.07)
Basophils Absolute: 0.1 K/uL (ref 0.0–0.1)
Basophils Relative: 1 %
Eosinophils Absolute: 0.3 K/uL (ref 0.0–0.5)
Eosinophils Relative: 3 %
HCT: 42.8 % (ref 36.0–46.0)
Hemoglobin: 14.2 g/dL (ref 12.0–15.0)
Immature Granulocytes: 0 %
Lymphocytes Relative: 14 %
Lymphs Abs: 1.7 K/uL (ref 0.7–4.0)
MCH: 30.8 pg (ref 26.0–34.0)
MCHC: 33.2 g/dL (ref 30.0–36.0)
MCV: 92.8 fL (ref 80.0–100.0)
Monocytes Absolute: 0.7 K/uL (ref 0.1–1.0)
Monocytes Relative: 6 %
Neutro Abs: 9.3 K/uL — ABNORMAL HIGH (ref 1.7–7.7)
Neutrophils Relative %: 76 %
Platelets: 300 K/uL (ref 150–400)
RBC: 4.61 MIL/uL (ref 3.87–5.11)
RDW: 13.7 % (ref 11.5–15.5)
WBC: 12.1 K/uL — ABNORMAL HIGH (ref 4.0–10.5)
nRBC: 0 % (ref 0.0–0.2)

## 2024-01-15 LAB — COMPREHENSIVE METABOLIC PANEL WITH GFR
ALT: 21 U/L (ref 0–44)
AST: 24 U/L (ref 15–41)
Albumin: 3.9 g/dL (ref 3.5–5.0)
Alkaline Phosphatase: 59 U/L (ref 38–126)
Anion gap: 9 (ref 5–15)
BUN: 15 mg/dL (ref 8–23)
CO2: 25 mmol/L (ref 22–32)
Calcium: 9.5 mg/dL (ref 8.9–10.3)
Chloride: 106 mmol/L (ref 98–111)
Creatinine, Ser: 0.81 mg/dL (ref 0.44–1.00)
GFR, Estimated: >60 mL/min (ref >60)
Glucose, Bld: 107 mg/dL — ABNORMAL HIGH (ref 70–99)
Potassium: 4.2 mmol/L (ref 3.5–5.1)
Sodium: 140 mmol/L (ref 135–145)
Total Bilirubin: 0.8 mg/dL (ref 0.0–1.2)
Total Protein: 7.1 g/dL (ref 6.5–8.1)

## 2024-01-15 LAB — URINALYSIS, ROUTINE W REFLEX MICROSCOPIC
Bacteria, UA: NONE SEEN
RBC / HPF: >50 RBC/hpf (ref 0–5)
Squamous Epithelial / HPF: 0 /HPF (ref 0–5)
WBC, UA: >50 WBC/hpf (ref 0–5)

## 2024-01-15 MED ORDER — CEPHALEXIN 500 MG PO CAPS: 500.0000 mg | ORAL_CAPSULE | Freq: Two times a day (BID) | ORAL | 0 refills | Status: AC

## 2024-01-15 NOTE — Discharge Instructions (Addendum)
 I believe the blood in your urine is the result of a urinary tract infection.  Please take all of the antibiotics as prescribed.  Make sure you take all of the medication even if you begin to feel better.  Your urine has been sent for culture.  Keep an eye out for a phone call from the hospital regarding this.  They will contact you if there needs to be any changes to your medication.  Sometimes blood in the urine can be a sign of bladder cancer.  If it does not resolve with the antibiotics please schedule a follow-up appointment with urology.  Their information is attached, call to schedule.

## 2024-01-15 NOTE — ED Triage Notes (Signed)
 Pt presents to the ED via POV from home for hematuria and urinary retention. Pt reports feeling like she is unable to empty her bladder and states that she has blood in her urine. Pt reports this starting this morning. Pt denies fevers or chills. Denies abdominal pain. Reports dysuria.

## 2024-01-15 NOTE — ED Notes (Signed)
 Patient c/o still feeling full after voiding. Tinnie Hudson PA-C aware.

## 2024-01-15 NOTE — Telephone Encounter (Signed)
 FYI Only or Action Required?: FYI only for provider.  Patient was last seen in primary care on 11/12/2023 by Cannady, Jolene T, NP.  Called Nurse Triage reporting No chief complaint on file..  Symptoms began yesterday.  Interventions attempted: Nothing.  Symptoms are: gradually worsening.  Triage Disposition: Go to ED Now (Notify PCP)  Patient/caregiver understands and will follow disposition?: Yes   Copied from CRM [id number]. Topic: Clinical - Red Word Triage >> Jan 15, 2024  8:21 AM Michelle Espinoza wrote: Red Word that prompted transfer to Nurse Triage:  She has blood in her urine. She does have some pain (cramp feeling). No burning. Reason for Disposition  [1] Unable to urinate (or only a few drops) > 4 hours AND [2] bladder feels very full (e.g., palpable bladder or strong urge to urinate)  Answer Assessment - Initial Assessment Questions 1. COLOR of URINE: Describe the color of the urine.  (e.g., tea-colored, pink, red, bloody) Do you have blood clots in your urine? (e.g., none, pea, grape, small coin)     Red  2. ONSET: When did the bleeding start?      This morning 3. EPISODES: How many times has there been blood in the urine? or How many times today?     Once  4. PAIN with URINATION: Is there any pain with passing your urine? If Yes, ask: How bad is the pain?  (Scale 1-10; or mild, moderate, severe)    - MILD: Complains slightly about urination hurting.    - MODERATE: Interferes with normal activities.      - SEVERE: Excruciating, unwilling or unable to urinate because of the pain.      No  5. FEVER: Do you have a fever? If Yes, ask: What is your temperature, how was it measured, and when did it start?      No  6. ASSOCIATED SYMPTOMS: Are you passing urine more frequently than usual?     Urgency, Frequency, Bladder Feels Full  7. OTHER SYMPTOMS: Do you have any other symptoms? (e.g., back/flank pain, abdomen pain, vomiting)     No  8. PREGNANCY: Is  there any chance you are pregnant? When was your last menstrual period?     No and No  Protocols used: Urine - Blood In-A-AH

## 2024-01-15 NOTE — ED Provider Notes (Signed)
 [facility] Provider Note    Event Date/Time   First MD Initiated Contact with Patient 01/15/24 [phone]     (approximate)   History   Hematuria and Urinary Retention   HPI  Michelle Espinoza is a 73 y.o. female with PMH of hypertension, emphysema and smoking presents for evaluation hematuria and urinary retention.  Patient states that this morning she noticed blood in her urine which is what brought her to the emergency department.  She has the sensation that she needs to pee but when she goes does not have much urine produced.  She denies abdominal pain but feels pelvic pressure.  Denies back pain, fevers and chills.  Does report having some burning with urination.      Physical Exam   Triage Vital Signs: ED Triage Vitals [01/15/24 0919]  Encounter Vitals Group     BP (!) 109/90     Girls Systolic BP Percentile      Girls Diastolic BP Percentile      Boys Systolic BP Percentile      Boys Diastolic BP Percentile      Pulse Rate 90     Resp 18     Temp 98.3 F (36.8 C)     Temp src      SpO2 100 %     Weight 170 lb (77.1 kg)     Height 5' 3 (1.6 m)     Head Circumference      Peak Flow      Pain Score 0     Pain Loc      Pain Education      Exclude from Growth Chart     Most recent vital signs: Vitals:   01/15/24 0919  BP: (!) 109/90  Pulse: 90  Resp: 18  Temp: 98.3 F (36.8 C)  SpO2: 100%   General: Awake, no distress.  CV:  Good peripheral perfusion.  RRR. Resp:  Normal effort.  CTAB. Abd:  No distention.  No CVA tenderness.   ED Results / Procedures / Treatments   Labs (all labs ordered are listed, but only abnormal results are displayed) Labs Reviewed  URINALYSIS, ROUTINE W REFLEX MICROSCOPIC - Abnormal; Notable for the following components:      Result Value   Color, Urine RED (*)    APPearance TURBID (*)    Glucose, UA   (*)    Value: TEST NOT REPORTED DUE TO COLOR INTERFERENCE OF URINE PIGMENT   Hgb urine dipstick    (*)    Value: TEST NOT REPORTED DUE TO COLOR INTERFERENCE OF URINE PIGMENT   Bilirubin Urine   (*)    Value: TEST NOT REPORTED DUE TO COLOR INTERFERENCE OF URINE PIGMENT   Ketones, ur   (*)    Value: TEST NOT REPORTED DUE TO COLOR INTERFERENCE OF URINE PIGMENT   Protein, ur   (*)    Value: TEST NOT REPORTED DUE TO COLOR INTERFERENCE OF URINE PIGMENT   Nitrite   (*)    Value: TEST NOT REPORTED DUE TO COLOR INTERFERENCE OF URINE PIGMENT   Leukocytes,Ua   (*)    Value: TEST NOT REPORTED DUE TO COLOR INTERFERENCE OF URINE PIGMENT   All other components within normal limits  COMPREHENSIVE METABOLIC PANEL WITH GFR - Abnormal; Notable for the following components:   Glucose, Bld 107 (*)    All other components within normal limits  CBC WITH DIFFERENTIAL/PLATELET - Abnormal; Notable for the following components:  WBC 12.1 (*)    Neutro Abs 9.3 (*)    All other components within normal limits  URINE CULTURE     PROCEDURES:  Critical Care performed: No  Procedures   MEDICATIONS ORDERED IN ED: Medications - No data to display   IMPRESSION / MDM / ASSESSMENT AND PLAN / ED COURSE  I reviewed the triage vital signs and the nursing notes.                             73 year old female presents for evaluation of gross hematuria and sensation of urinary retention.  Diastolic blood pressure is elevated otherwise vital signs are stable.  Patient NAD on exam.  Differential diagnosis includes, but is not limited to, UTI, nephrolithiasis, pyelonephritis, bladder cancer.  Patient's presentation is most consistent with acute complicated illness / injury requiring diagnostic workup.  Based on description of patient's symptoms, suspect she has a UTI.  UA difficult to interpret due to the amount of blood but does show greater than 50 WBCs.  Will send urine for culture. I would like to check some labs to make sure she is not anemic and that her kidney function is ok, if normal would be safe for  discharge on oral antibiotics. Did discuss following up with urology if the hematuria does not clear after finishing the antibiotics.  Explained that this is a symptom of bladder cancer and with her smoking history she is at increased risk.  Patient voiced understanding.   Clinical Course as of 01/15/24 1147  Tue Jan 15, 2024  1119 CBC with Differential(!) Mild leukocytosis, may be elevated due to a UTI. No anemia.  [LD]  1131 Comprehensive metabolic panel(!) Elevated glucose, otherwise normal, good kidney function. [LD]  1132 Labs reassuring. Patient is stable for outpatient management.  [LD]  1146 Reviewed plan of care with the patient.  She voiced understanding, all questions were answered and she stable at discharge. [LD]    Clinical Course User Index [LD] Cleaster Tinnie LABOR, PA-C     FINAL CLINICAL IMPRESSION(S) / ED DIAGNOSES   Final diagnoses:  Gross hematuria     Rx / DC Orders   ED Discharge Orders          Ordered    cephALEXin  (KEFLEX ) 500 MG capsule  2 times daily        01/15/24 1139             Note:  This document was prepared using Dragon voice recognition software and may include unintentional dictation errors.   Cleaster Tinnie LABOR, PA-C 01/15/24 1147    Claudene Rover, MD 01/15/24 1459

## 2024-01-16 LAB — URINE CULTURE: Culture: <10000 — AB

## 2024-04-03 ENCOUNTER — Other Ambulatory Visit: Payer: Self-pay | Admitting: Acute Care

## 2024-04-03 DIAGNOSIS — F17210 Nicotine dependence, cigarettes, uncomplicated: Secondary | ICD-10-CM

## 2024-04-03 DIAGNOSIS — Z87891 Personal history of nicotine dependence: Secondary | ICD-10-CM

## 2024-04-03 DIAGNOSIS — Z122 Encounter for screening for malignant neoplasm of respiratory organs: Secondary | ICD-10-CM

## 2024-05-10 NOTE — Patient Instructions (Signed)
 Be Involved in Caring For Your Health:  Taking Medications When medications are taken as directed, they can greatly improve your health. But if they are not taken as prescribed, they may not work. In some cases, not taking them correctly can be harmful. To help ensure your treatment remains effective and safe, understand your medications and how to take them. Bring your medications to each visit for review by your provider.  Your lab results, notes, and after visit summary will be available on My Chart. We strongly encourage you to use this feature. If lab results are abnormal the clinic will contact you with the appropriate steps. If the clinic does not contact you assume the results are satisfactory. You can always view your results on My Chart. If you have questions regarding your health or results, please contact the clinic during office hours. You can also ask questions on My Chart.  We at [facility] are grateful that you chose us  to provide your care. We strive to provide evidence-based and compassionate care and are always looking for feedback. If you get a survey from the clinic please complete this so we can hear your opinions.  DASH Eating Plan DASH stands for Dietary Approaches to Stop Hypertension. The DASH eating plan is a healthy eating plan that has been shown to: Lower high blood pressure (hypertension). Reduce your risk for type 2 diabetes, heart disease, and stroke. Help with weight loss. What are tips for following this plan? Reading food labels Check food labels for the amount of salt (sodium) per serving. Choose foods with less than 5 percent of the Daily Value (DV) of sodium. In general, foods with less than 300 milligrams (mg) of sodium per serving fit into this eating plan. To find whole grains, look for the word whole as the first word in the ingredient list. Shopping Buy products labeled as low-sodium or no salt added. Buy fresh foods. Avoid canned  foods and pre-made or frozen meals. Cooking Try not to add salt when you cook. Use salt-free seasonings or herbs instead of table salt or sea salt. Check with your health care provider or pharmacist before using salt substitutes. Do not fry foods. Cook foods in healthy ways, such as baking, boiling, grilling, roasting, or broiling. Cook using oils that are good for your heart. These include olive, canola, avocado, soybean, and sunflower oil. Meal planning  Eat a balanced diet. This should include: 4 or more servings of fruits and 4 or more servings of vegetables each day. Try to fill half of your plate with fruits and vegetables. 6-8 servings of whole grains each day. 6 or less servings of lean meat, poultry, or fish each day. 1 oz is 1 serving. A 3 oz (85 g) serving of meat is about the same size as the palm of your hand. One egg is 1 oz (28 g). 2-3 servings of low-fat dairy each day. One serving is 1 cup (237 mL). 1 serving of nuts, seeds, or beans 5 times each week. 2-3 servings of heart-healthy fats. Healthy fats called omega-3 fatty acids are found in foods such as walnuts, flaxseeds, fortified milks, and eggs. These fats are also found in cold-water fish, such as sardines, salmon, and mackerel. Limit how much you eat of: Canned or prepackaged foods. Food that is high in trans fat, such as fried foods. Food that is high in saturated fat, such as fatty meat. Desserts and other sweets, sugary drinks, and other foods with added sugar. Full-fat  dairy products. Do not salt foods before eating. Do not eat more than 4 egg yolks a week. Try to eat at least 2 vegetarian meals a week. Eat more home-cooked food and less restaurant, buffet, and fast food. Lifestyle When eating at a restaurant, ask if your food can be made with less salt or no salt. If you drink alcohol: Limit how much you have to: 0-1 drink a day if you are female. 0-2 drinks a day if you are female. Know how much alcohol is in  your drink. In the [place] one drink is one 12 oz bottle of beer (355 mL), one 5 oz glass of wine (148 mL), or one 1 oz glass of hard liquor (44 mL). General information Avoid eating more than 2,300 mg of salt a day. If you have hypertension, you may need to reduce your sodium intake to 1,500 mg a day. Work with your provider to stay at a healthy body weight or lose weight. Ask what the best weight range is for you. On most days of the week, get at least 30 minutes of exercise that causes your heart to beat faster. This may include walking, swimming, or biking. Work with your provider or dietitian to adjust your eating plan to meet your specific calorie needs. What foods should I eat? Fruits All fresh, dried, or frozen fruit. Canned fruits that are in their natural juice and do not have sugar added to them. Vegetables Fresh or frozen vegetables that are raw, steamed, roasted, or grilled. Low-sodium or reduced-sodium tomato and vegetable juice. Low-sodium or reduced-sodium tomato sauce and tomato paste. Low-sodium or reduced-sodium canned vegetables. Grains Whole-grain or whole-wheat bread. Whole-grain or whole-wheat pasta. Brown rice. [place]. Bulgur. Whole-grain and low-sodium cereals. Pita bread. Low-fat, low-sodium crackers. Whole-wheat flour tortillas. Meats and other proteins Skinless chicken or [place]. Ground chicken or [place]. Pork with fat trimmed off. Fish and seafood. Egg whites. Dried beans, peas, or lentils. Unsalted nuts, nut butters, and seeds. Unsalted canned beans. Lean cuts of beef with fat trimmed off. Low-sodium, lean precooked or cured meat, such as sausages or meat loaves. Dairy Low-fat (1%) or fat-free (skim) milk. Reduced-fat, low-fat, or fat-free cheeses. Nonfat, low-sodium ricotta or cottage cheese. Low-fat or nonfat yogurt. Low-fat, low-sodium cheese. Fats and oils Soft margarine without trans fats. Vegetable oil. Reduced-fat, low-fat, or light mayonnaise and salad  dressings (reduced-sodium). Canola, safflower, olive, avocado, soybean, and sunflower oils. Avocado. Seasonings and condiments Herbs. Spices. Seasoning mixes without salt. Other foods Unsalted popcorn and pretzels. Fat-free sweets. The items listed above may not be all the foods and drinks you can have. Talk to a dietitian to learn more. What foods should I avoid? Fruits Canned fruit in a light or heavy syrup. Fried fruit. Fruit in cream or butter sauce. Vegetables Creamed or fried vegetables. Vegetables in a cheese sauce. Regular canned vegetables that are not marked as low-sodium or reduced-sodium. Regular canned tomato sauce and paste that are not marked as low-sodium or reduced-sodium. Regular tomato and vegetable juices that are not marked as low-sodium or reduced-sodium. Dene. Olives. Grains Baked goods made with fat, such as croissants, muffins, or some breads. Dry pasta or rice meal packs. Meats and other proteins Fatty cuts of meat. Ribs. Fried meat. [place]. Bologna, salami, and other precooked or cured meats, such as sausages or meat loaves, that are not lean and low in sodium. Fat from the back of a pig (fatback). Bratwurst. Salted nuts and seeds. Canned beans with added salt. Canned  or smoked fish. Whole eggs or egg yolks. Chicken or [place] with skin. Dairy Whole or 2% milk, cream, and half-and-half. Whole or full-fat cream cheese. Whole-fat or sweetened yogurt. Full-fat cheese. Nondairy creamers. Whipped toppings. Processed cheese and cheese spreads. Fats and oils Butter. Stick margarine. Lard. Shortening. Ghee. Bacon fat. Tropical oils, such as coconut, palm kernel, or palm oil. Seasonings and condiments Onion salt, garlic salt, seasoned salt, table salt, and sea salt. Worcestershire sauce. Tartar sauce. Barbecue sauce. Teriyaki sauce. Soy sauce, including reduced-sodium soy sauce. Steak sauce. Canned and packaged gravies. Fish sauce. Oyster sauce. Cocktail sauce. Store-bought  horseradish. Ketchup. Mustard. Meat flavorings and tenderizers. Bouillon cubes. Hot sauces. Pre-made or packaged marinades. Pre-made or packaged taco seasonings. Relishes. Regular salad dressings. Other foods Salted popcorn and pretzels. The items listed above may not be all the foods and drinks you should avoid. Talk to a dietitian to learn more. Where to find more information National Heart, Lung, and Blood Institute (NHLBI): BuffaloDryCleaner.gl American Heart Association (AHA): heart.org Academy of Nutrition and Dietetics: eatright.org National Kidney Foundation (NKF): kidney.org This information is not intended to replace advice given to you by your health care provider. Make sure you discuss any questions you have with your health care provider. Document Revised: 07/13/2022 Document Reviewed: 07/13/2022 Elsevier Patient Education  2024 [employer].

## 2024-05-13 ENCOUNTER — Ambulatory Visit
Admission: RE | Admit: 2024-05-13 | Discharge: 2024-05-13 | Disposition: A | Discharge status: Home / Self Care | Source: Ambulatory Visit | Attending: Acute Care | Admitting: Acute Care

## 2024-05-13 DIAGNOSIS — Z122 Encounter for screening for malignant neoplasm of respiratory organs: Secondary | ICD-10-CM | POA: Insufficient documentation

## 2024-05-13 DIAGNOSIS — Z87891 Personal history of nicotine dependence: Secondary | ICD-10-CM | POA: Diagnosis not present

## 2024-05-13 DIAGNOSIS — F17210 Nicotine dependence, cigarettes, uncomplicated: Secondary | ICD-10-CM | POA: Diagnosis not present

## 2024-05-16 ENCOUNTER — Ambulatory Visit (INDEPENDENT_AMBULATORY_CARE_PROVIDER_SITE_OTHER): Admitting: Nurse Practitioner

## 2024-05-16 ENCOUNTER — Encounter: Payer: Self-pay | Admitting: Nurse Practitioner

## 2024-05-16 VITALS — BP 148/86 | HR 86 | Temp 98.0°F | Ht 63.0 in | Wt 165.5 lb

## 2024-05-16 DIAGNOSIS — E6609 Other obesity due to excess calories: Secondary | ICD-10-CM

## 2024-05-16 DIAGNOSIS — Z6830 Body mass index (BMI) 30.0-30.9, adult: Secondary | ICD-10-CM

## 2024-05-16 DIAGNOSIS — Z23 Encounter for immunization: Secondary | ICD-10-CM | POA: Diagnosis not present

## 2024-05-16 DIAGNOSIS — G250 Essential tremor: Secondary | ICD-10-CM | POA: Insufficient documentation

## 2024-05-16 DIAGNOSIS — E782 Mixed hyperlipidemia: Secondary | ICD-10-CM

## 2024-05-16 DIAGNOSIS — F3342 Major depressive disorder, recurrent, in full remission: Secondary | ICD-10-CM | POA: Diagnosis not present

## 2024-05-16 DIAGNOSIS — E063 Autoimmune thyroiditis: Secondary | ICD-10-CM

## 2024-05-16 DIAGNOSIS — E66811 Obesity, class 1: Secondary | ICD-10-CM

## 2024-05-16 DIAGNOSIS — I10 Essential (primary) hypertension: Secondary | ICD-10-CM

## 2024-05-16 DIAGNOSIS — G2581 Restless legs syndrome: Secondary | ICD-10-CM

## 2024-05-16 DIAGNOSIS — J432 Centrilobular emphysema: Secondary | ICD-10-CM

## 2024-05-16 DIAGNOSIS — F17210 Nicotine dependence, cigarettes, uncomplicated: Secondary | ICD-10-CM

## 2024-05-16 MED ORDER — PROPRANOLOL HCL 40 MG PO TABS: 40.0000 mg | ORAL_TABLET | Freq: Every day | ORAL | 2 refills | Status: DC

## 2024-05-16 NOTE — Assessment & Plan Note (Signed)
 Chronic, stable.  Continue current medication regimen and adjust as needed.  Lipid panel today.

## 2024-05-16 NOTE — Assessment & Plan Note (Signed)
BMI 29.32.  Recommended eating smaller high protein, low fat meals more frequently and exercising 30 mins a day 5 times a week with a goal of 10-15lb weight loss in the next 3 months. Patient voiced their understanding and motivation to adhere to these recommendations.

## 2024-05-16 NOTE — Assessment & Plan Note (Signed)
I have recommended complete cessation of tobacco use. I have discussed various options available for assistance with tobacco cessation including over the counter methods (Nicotine gum, patch and lozenges). We also discussed prescription options (Chantix, Nicotine Inhaler / Nasal Spray). The patient is not interested in pursuing any prescription tobacco cessation options at this time.  Continue annual screening.

## 2024-05-16 NOTE — Progress Notes (Signed)
 BP (!) 148/86 (BP Location: Left Arm, Patient Position: Sitting, Cuff Size: Normal)   Pulse 86   Temp 98 F (36.7 C) (Oral)   Ht 5' 3 (1.6 m)   Wt 165 lb 8 oz (75.1 kg)   SpO2 97%   BMI 29.32 kg/m    Subjective:    Patient ID: Michelle Espinoza, female    DOB: 27-Feb-1951, 73 y.o.   MRN: [id number]  HPI: Michelle Espinoza is a 73 y.o. female  Chief Complaint  Patient presents with   Follow-up   Hypertension   Depression   HYPERTENSION / HYPERLIPIDEMIA Taking Losartan -HCTZ 100-12.5 daily, Zetia , and Rosuvastatin .  Has not taken medication this morning, got up late and was rushing out door.  Has tremor which has been present for years, essential tremor. Family members have this too.  Has never taken medication for this or seen neurology.  Upper tremors. Satisfied with current treatment? yes Duration of hypertension: chronic BP monitoring frequency: not checking BP range:  BP medication side effects: no Duration of hyperlipidemia: chronic Cholesterol medication side effects: no Cholesterol supplements: none Medication compliance: fair compliance Aspirin: yes Recent stressors: no Recurrent headaches: no Visual changes: no Palpitations: no Dyspnea: no Chest pain: no Lower extremity edema: no Dizzy/lightheaded: no   COPD Has Albuterol . Is a smoker, 1/2 PPD.  Has smoked since she was 17-18. Lung screening last 11/21/22 showed aortic atherosclerosis, centrilobular emphysema and aortic atherosclerosis + cholelithiasis.   COPD status: stable Satisfied with current treatment?: yes Oxygen use: no Dyspnea frequency: no  Cough frequency: no Rescue inhaler frequency: a couple times if walking longer distance Limitation of activity: no Productive cough: no Last Spirometry: 11/08/2022 Pneumovax: Up to Date Influenza: Up to Date   HYPOTHYROIDISM Taking Levothyroxine  25 MCG daily.  Thyroid  control status: controlled Satisfied with current treatment? yes Medication side effects:  no Medication compliance: fair compliance Etiology of hypothyroidism:  Recent dose adjustment:no Fatigue: no Cold intolerance: no Heat intolerance: no Weight gain: no Weight loss: no Constipation: no Diarrhea/loose stools: no Palpitations: no Lower extremity edema: no Anxiety/depressed mood: no   DEPRESSION Taking Wellbutrin  and Prozac  daily.  Followed with Dr. Chipper in past, last saw 04/20/22, currently PCP is following. Mood status: stable Satisfied with current treatment?: yes Symptom severity: moderate  Duration of current treatment : chronic Side effects: no Medication compliance: good compliance Psychotherapy/counseling: yes current Depressed mood: no Anxious mood: no Anhedonia: no Significant weight loss or gain: no Insomnia: none Fatigue: on occasion Feelings of worthlessness or guilt: no Impaired concentration/indecisiveness: no Suicidal ideations: no Hopelessness: no Crying spells: no    05/16/2024    9:07 AM 11/12/2023    1:05 PM 07/12/2023   11:30 AM 05/24/2023   10:42 AM 05/11/2023   10:53 AM  Depression screen PHQ 2/9  Decreased Interest 0 0 0 0 0  Down, Depressed, Hopeless 0 0 0 0 0  PHQ - 2 Score 0 0 0 0 0  Altered sleeping 0 0 0 0 0  Tired, decreased energy 0 0 0 0 0  Change in appetite 0 0 0 0 0  Feeling bad or failure about yourself  0 0 0 0 0  Trouble concentrating 0 0 0 0 0  Moving slowly or fidgety/restless 0 0 0 0 0  Suicidal thoughts 0 0 0 0 0  PHQ-9 Score 0 0  0  0  0   Difficult doing work/chores Not difficult at all  Data saved with a previous flowsheet row definition       05/16/2024    9:07 AM 11/12/2023    1:05 PM 05/24/2023   10:42 AM 05/11/2023   10:53 AM  GAD 7 : Generalized Anxiety Score  Nervous, Anxious, on Edge 0 0 0 0  Control/stop worrying 0 0 0 0  Worry too much - different things 0 0 0 0  Trouble relaxing 0 0 0 0  Restless 0 0 0 0  Easily annoyed or irritable 0 0 0 0  Afraid - awful might happen 0 0 0 0   Total GAD 7 Score 0 0 0 0  Anxiety Difficulty Not difficult at all      Relevant past medical, surgical, family and social history reviewed and updated as indicated. Interim medical history since our last visit reviewed. Allergies and medications reviewed and updated.  Review of Systems  Constitutional:  Negative for activity change, appetite change, diaphoresis, fatigue and fever.  Respiratory:  Negative for cough, chest tightness and shortness of breath.   Cardiovascular:  Negative for chest pain, palpitations and leg swelling.  Gastrointestinal: Negative.   Endocrine: Negative for cold intolerance and heat intolerance.  Neurological:  Positive for tremors.  Psychiatric/Behavioral: Negative.      Per HPI unless specifically indicated above     Objective:    BP (!) 148/86 (BP Location: Left Arm, Patient Position: Sitting, Cuff Size: Normal)   Pulse 86   Temp 98 F (36.7 C) (Oral)   Ht 5' 3 (1.6 m)   Wt 165 lb 8 oz (75.1 kg)   SpO2 97%   BMI 29.32 kg/m   Wt Readings from Last 3 Encounters:  05/16/24 165 lb 8 oz (75.1 kg)  05/13/24 165 lb (74.8 kg)  01/15/24 170 lb (77.1 kg)    Physical Exam Vitals and nursing note reviewed.  Constitutional:      General: She is awake. She is not in acute distress.    Appearance: She is well-developed and well-groomed. She is not ill-appearing or toxic-appearing.  HENT:     Head: Normocephalic.     Right Ear: Hearing and external ear normal.     Left Ear: Hearing and external ear normal.  Eyes:     General: Lids are normal.        Right eye: No discharge.        Left eye: No discharge.     Conjunctiva/sclera: Conjunctivae normal.     Pupils: Pupils are equal, round, and reactive to light.  Neck:     Thyroid : No thyromegaly.     Vascular: No carotid bruit.  Cardiovascular:     Rate and Rhythm: Normal rate and regular rhythm.     Heart sounds: Normal heart sounds. No murmur heard.    No gallop.  Pulmonary:     Effort:  Pulmonary effort is normal. No accessory muscle usage or respiratory distress.     Breath sounds: Normal breath sounds.  Abdominal:     General: Bowel sounds are normal. There is no distension.     Palpations: Abdomen is soft.     Tenderness: There is no abdominal tenderness.  Musculoskeletal:     Cervical back: Normal range of motion and neck supple.     Right lower leg: No edema.     Left lower leg: No edema.  Lymphadenopathy:     Cervical: No cervical adenopathy.  Skin:    General: Skin is warm and dry.  Neurological:  Mental Status: She is alert and oriented to person, place, and time.     Cranial Nerves: Cranial nerves 2-12 are intact.     Motor: Tremor (upper extremities bilateral with movement) present.     Coordination: Coordination is intact.     Gait: Gait is intact.     Deep Tendon Reflexes: Reflexes are normal and symmetric.     Reflex Scores:      Brachioradialis reflexes are 2+ on the right side and 2+ on the left side.      Patellar reflexes are 2+ on the right side and 2+ on the left side.    Comments: No cogwheel noted. No flat affect. Normal gait.  Psychiatric:        Attention and Perception: Attention normal.        Mood and Affect: Mood normal.        Speech: Speech normal.        Behavior: Behavior normal. Behavior is cooperative.        Thought Content: Thought content normal.    Results for orders placed or performed during the hospital encounter of 01/15/24  Urine Culture   Collection Time: 01/15/24  9:21 AM   Specimen: Urine, Clean Catch  Result Value Ref Range   Specimen Description      URINE, CLEAN CATCH Performed at [facility], [address] [place], [place] [address]    Special Requests      NONE Performed at [facility], [address], [place], [place] [address]    Culture (A)     <10,000 COLONIES/mL INSIGNIFICANT GROWTH Performed at [facility], [address] [place], [place] [address]    Report  Status 01/16/2024 FINAL   Urinalysis, Routine w reflex microscopic -Urine, Clean Catch   Collection Time: 01/15/24  9:21 AM  Result Value Ref Range   Color, Urine RED (A) YELLOW   APPearance TURBID (A) CLEAR   Specific Gravity, Urine  1.005 - 1.030    TEST NOT REPORTED DUE TO COLOR INTERFERENCE OF URINE PIGMENT   pH  5.0 - 8.0    TEST NOT REPORTED DUE TO COLOR INTERFERENCE OF URINE PIGMENT   Glucose, UA (A) NEGATIVE mg/dL    TEST NOT REPORTED DUE TO COLOR INTERFERENCE OF URINE PIGMENT   Hgb urine dipstick (A) NEGATIVE    TEST NOT REPORTED DUE TO COLOR INTERFERENCE OF URINE PIGMENT   Bilirubin Urine (A) NEGATIVE    TEST NOT REPORTED DUE TO COLOR INTERFERENCE OF URINE PIGMENT   Ketones, ur (A) NEGATIVE mg/dL    TEST NOT REPORTED DUE TO COLOR INTERFERENCE OF URINE PIGMENT   Protein, ur (A) NEGATIVE mg/dL    TEST NOT REPORTED DUE TO COLOR INTERFERENCE OF URINE PIGMENT   Nitrite (A) NEGATIVE    TEST NOT REPORTED DUE TO COLOR INTERFERENCE OF URINE PIGMENT   Leukocytes,Ua (A) NEGATIVE    TEST NOT REPORTED DUE TO COLOR INTERFERENCE OF URINE PIGMENT   RBC / HPF >50 0 - 5 RBC/hpf   WBC, UA >50 0 - 5 WBC/hpf   Bacteria, UA NONE SEEN NONE SEEN   Squamous Epithelial / HPF 0 0 - 5 /HPF  Comprehensive metabolic panel   Collection Time: 01/15/24 11:02 AM  Result Value Ref Range   Sodium 140 135 - 145 mmol/L   Potassium 4.2 3.5 - 5.1 mmol/L   Chloride 106 98 - 111 mmol/L   CO2 25 22 - 32 mmol/L   Glucose,  Bld 107 (H) 70 - 99 mg/dL   BUN 15 8 - 23 mg/dL   Creatinine, Ser 9.18 0.44 - 1.00 mg/dL   Calcium  9.5 8.9 - 10.3 mg/dL   Total Protein 7.1 6.5 - 8.1 g/dL   Albumin 3.9 3.5 - 5.0 g/dL   AST 24 15 - 41 U/L   ALT 21 0 - 44 U/L   Alkaline Phosphatase 59 38 - 126 U/L   Total Bilirubin 0.8 0.0 - 1.2 mg/dL   GFR, Estimated >39 >39 mL/min   Anion gap 9 5 - 15  CBC with Differential   Collection Time: 01/15/24 11:02 AM  Result Value Ref Range   WBC 12.1 (H) 4.0 - 10.5 K/uL   RBC 4.61 3.87  - 5.11 MIL/uL   Hemoglobin 14.2 12.0 - 15.0 g/dL   HCT 57.1 63.9 - 53.9 %   MCV 92.8 80.0 - 100.0 fL   MCH 30.8 26.0 - 34.0 pg   MCHC 33.2 30.0 - 36.0 g/dL   RDW 86.2 88.4 - 84.4 %   Platelets 300 150 - 400 K/uL   nRBC 0.0 0.0 - 0.2 %   Neutrophils Relative % 76 %   Neutro Abs 9.3 (H) 1.7 - 7.7 K/uL   Lymphocytes Relative 14 %   Lymphs Abs 1.7 0.7 - 4.0 K/uL   Monocytes Relative 6 %   Monocytes Absolute 0.7 0.1 - 1.0 K/uL   Eosinophils Relative 3 %   Eosinophils Absolute 0.3 0.0 - 0.5 K/uL   Basophils Relative 1 %   Basophils Absolute 0.1 0.0 - 0.1 K/uL   Immature Granulocytes 0 %   Abs Immature Granulocytes 0.04 0.00 - 0.07 K/uL      Assessment & Plan:   Problem List Items Addressed This Visit       Cardiovascular and Mediastinum   Essential hypertension   Chronic, ongoing.  BP above goal today, trend down with recheck - has not taken medications this morning  Recommend she monitor BP at least a few mornings a week at home and document.  DASH diet at home.  Continue current medication regimen and adjust as needed.  Labs today: CMP.          Relevant Medications   propranolol (INDERAL) 40 MG tablet     Respiratory   Centrilobular emphysema (HCC) - Primary   Chronic.  Ongoing in long time smoker and noted on CT lung screening.  Recommend complete cessation of smoking.  Albuterol  as needed. Spirometry with remains stable - 74% and 74% May 2025.  Educated her at length on emphysema.  Start maintenance inhalers as needed in future.          Endocrine   Hashimoto's thyroiditis   Chronic, ongoing.  Continue Levothyroxine  25 MCG daily.  Continue current medication regimen and adjust as needed.  Labs up to date.      Relevant Medications   propranolol (INDERAL) 40 MG tablet     Nervous and Auditory   Benign familial tremor   Chronic, ongoing for years.  She feels it is not worsening. Family members have same. Will trial Propranolol as she would like better control of  this, start at 40 MG daily. HR today in 80's.  Educated her on medication and side effects to monitor for.  Do not take medication if HR <60.  Return in 6 weeks for follow-up. Consider neurology if worsening.        Other   Recurrent major depressive disorder, in full  remission   Chronic, stable.  Denies SI/HI.  Continue current medication regimen and adjust as needed.  Will have return to psychiatry as needed if worsening mood presents.      Obesity   BMI 29.32.  Recommended eating smaller high protein, low fat meals more frequently and exercising 30 mins a day 5 times a week with a goal of 10-15lb weight loss in the next 3 months. Patient voiced their understanding and motivation to adhere to these recommendations.       Nicotine dependence, cigarettes, uncomplicated   I have recommended complete cessation of tobacco use. I have discussed various options available for assistance with tobacco cessation including over the counter methods (Nicotine gum, patch and lozenges). We also discussed prescription options (Chantix, Nicotine Inhaler / Nasal Spray). The patient is not interested in pursuing any prescription tobacco cessation options at this time.  Continue annual screening.       Hyperlipidemia   Chronic, stable.  Continue current medication regimen and adjust as needed.  Lipid panel today.      Relevant Medications   propranolol (INDERAL) 40 MG tablet   Other Relevant Orders   Comprehensive metabolic panel with GFR   Lipid Panel w/o Chol/HDL Ratio   Other Visit Diagnoses       Flu vaccine need       Flu vaccine today, educated patient.   Relevant Orders   Flu vaccine HIGH DOSE PF(Fluzone Trivalent)        Follow up plan: Return in about 6 weeks (around 06/27/2024) for Familial Tremor -- started Propranolol 40 MG daily.

## 2024-05-16 NOTE — Assessment & Plan Note (Signed)
 Chronic, ongoing.  Continue Levothyroxine 25 MCG daily.  Continue current medication regimen and adjust as needed.  Labs up to date.

## 2024-05-16 NOTE — Assessment & Plan Note (Addendum)
 Chronic, ongoing.  BP above goal today, trend down with recheck - has not taken medications this morning  Recommend she monitor BP at least a few mornings a week at home and document.  DASH diet at home.  Continue current medication regimen and adjust as needed.  Labs today: CMP.

## 2024-05-16 NOTE — Assessment & Plan Note (Signed)
 Chronic, stable.  Denies SI/HI.  Continue current medication regimen and adjust as needed.  Will have return to psychiatry as needed if worsening mood presents.

## 2024-05-16 NOTE — Assessment & Plan Note (Addendum)
 Chronic, ongoing for years.  She feels it is not worsening. Family members have same. Will trial Propranolol as she would like better control of this, start at 40 MG daily. HR today in 80's.  Educated her on medication and side effects to monitor for.  Do not take medication if HR <60.  Return in 6 weeks for follow-up. Consider neurology if worsening.

## 2024-05-16 NOTE — Assessment & Plan Note (Signed)
 Chronic.  Ongoing in long time smoker and noted on CT lung screening.  Recommend complete cessation of smoking.  Albuterol  as needed. Spirometry with remains stable - 74% and 74% May 2025.  Educated her at length on emphysema.  Start maintenance inhalers as needed in future.

## 2024-05-17 ENCOUNTER — Ambulatory Visit: Payer: Self-pay | Admitting: Nurse Practitioner

## 2024-05-17 LAB — LIPID PANEL W/O CHOL/HDL RATIO
Cholesterol, Total: 132 mg/dL (ref 100–199)
HDL: 54 mg/dL (ref >39)
LDL Chol Calc (NIH): 64 mg/dL (ref 0–99)
Triglycerides: 67 mg/dL (ref 0–149)
VLDL Cholesterol Cal: 14 mg/dL (ref 5–40)

## 2024-05-17 LAB — COMPREHENSIVE METABOLIC PANEL WITH GFR
ALT: 17 IU/L (ref 0–32)
AST: 21 IU/L (ref 0–40)
Albumin: 4.3 g/dL (ref 3.8–4.8)
Alkaline Phosphatase: 77 IU/L (ref 49–135)
BUN/Creatinine Ratio: 21 (ref 12–28)
BUN: 16 mg/dL (ref 8–27)
Bilirubin Total: 0.3 mg/dL (ref 0.0–1.2)
CO2: 24 mmol/L (ref 20–29)
Calcium: 9.4 mg/dL (ref 8.7–10.3)
Chloride: 107 mmol/L — ABNORMAL HIGH (ref 96–106)
Creatinine, Ser: 0.77 mg/dL (ref 0.57–1.00)
Globulin, Total: 2.3 g/dL (ref 1.5–4.5)
Glucose: 98 mg/dL (ref 70–99)
Potassium: 4.7 mmol/L (ref 3.5–5.2)
Sodium: 142 mmol/L (ref 134–144)
Total Protein: 6.6 g/dL (ref 6.0–8.5)
eGFR: 82 mL/min/1.73 (ref >59)

## 2024-05-17 NOTE — Progress Notes (Signed)
 Contacted via MyChart  Good morning Michelle Espinoza, your labs have returned and remain stable. No medications changes needed. Any questions? Keep being amazing!!  Thank you for allowing me to participate in your care.  I appreciate you. Kindest regards, Brena Windsor

## 2024-05-19 ENCOUNTER — Other Ambulatory Visit: Payer: Self-pay

## 2024-05-19 DIAGNOSIS — F3342 Major depressive disorder, recurrent, in full remission: Secondary | ICD-10-CM | POA: Diagnosis not present

## 2024-05-19 DIAGNOSIS — F17210 Nicotine dependence, cigarettes, uncomplicated: Secondary | ICD-10-CM

## 2024-05-19 DIAGNOSIS — J432 Centrilobular emphysema: Secondary | ICD-10-CM | POA: Diagnosis not present

## 2024-05-19 DIAGNOSIS — Z87891 Personal history of nicotine dependence: Secondary | ICD-10-CM

## 2024-05-19 DIAGNOSIS — Z122 Encounter for screening for malignant neoplasm of respiratory organs: Secondary | ICD-10-CM

## 2024-06-22 NOTE — Patient Instructions (Signed)
 Be Involved in Caring For Your Health:  Taking Medications When medications are taken as directed, they can greatly improve your health. But if they are not taken as prescribed, they Fern not work. In some cases, not taking them correctly can be harmful. To help ensure your treatment remains effective and safe, understand your medications and how to take them. Bring your medications to each visit for review by your provider.  Your lab results, notes, and after visit summary will be available on My Chart. We strongly encourage you to use this feature. If lab results are abnormal the clinic will contact you with the appropriate steps. If the clinic does not contact you assume the results are satisfactory. You can always view your results on My Chart. If you have questions regarding your health or results, please contact the clinic during office hours. You can also ask questions on My Chart.  We at [facility] are grateful that you chose us  to provide your care. We strive to provide evidence-based and compassionate care and are always looking for feedback. If you get a survey from the clinic please complete this so we can hear your opinions.   Tremor A tremor is trembling or shaking that you cannot control. Most tremors affect the hands or arms. Tremors can also affect the head, vocal cords, face, and other parts of the body. There are many types of tremors. Common types include: Essential tremor. These usually occur in people older than 40. This type of tremor [place] run in families and can happen in otherwise healthy people. Resting tremor. These occur when the muscles are at rest, such as when your hands are resting in your lap. People with Parkinson's disease often have resting tremors. Postural tremor. These occur when you try to hold a pose, such as keeping your hands outstretched. Kinetic tremor. These occur during purposeful movement, such as trying to touch a finger to your  nose. Task-specific tremor. These Quiles occur when you do certain tasks such as writing, speaking, or standing. Psychogenic tremor. These are greatly reduced or go away when you are distracted. These tremors happen due to underlying stress or psychiatric disease. They can happen in people of all ages. Some types of tremors have no known cause. Tremors can also be a symptom of nervous system problems (neurological disorders) that [place] occur with aging. Some tremors go away with treatment, while others do not. Follow these instructions at home: Lifestyle     If you drink alcohol: Limit how much you have to: 0-1 drink a day for women who are not pregnant. 0-2 drinks a day for men. Know how much alcohol is in a drink. In the [place] one drink equals one 12 oz bottle of beer (355 mL), one 5 oz glass of wine (148 mL), or one 1 oz glass of hard liquor (44 mL). Do not use any products that contain nicotine  or tobacco. These products include cigarettes, chewing tobacco, and vaping devices, such as e-cigarettes. If you need help quitting, ask your health care provider. Avoid extreme heat and extreme cold. Limit your caffeine intake, as told by your health care provider. Try to get 8 hours of sleep each night. Find ways to manage your stress, such as meditation or yoga. General instructions Take over-the-counter and prescription medicines only as told by your health care provider. Keep all follow-up visits. This is important. Contact a health care provider if: You develop a tremor after starting a new medicine. You have a  tremor along with other symptoms such as: Numbness. Tingling. Pain. Weakness. Your tremor gets worse. Your tremor interferes with your day-to-day life. Summary A tremor is trembling or shaking that you cannot control. Most tremors affect the hands or arms. Some types of tremors have no known cause. Others [place] be a symptom of nervous system problems (neurological disorders). Make  sure you discuss any tremors you have with your health care provider. This information is not intended to replace advice given to you by your health care provider. Make sure you discuss any questions you have with your health care provider. Document Revised: 04/15/2021 Document Reviewed: 04/15/2021 Elsevier Patient Education  2024 [employer].

## 2024-06-26 ENCOUNTER — Encounter: Payer: Self-pay | Admitting: Nurse Practitioner

## 2024-06-26 ENCOUNTER — Ambulatory Visit: Admitting: Nurse Practitioner

## 2024-06-26 VITALS — BP 109/79 | HR 70 | Temp 98.2°F | Resp 15 | Ht 62.99 in | Wt 162.8 lb

## 2024-06-26 DIAGNOSIS — B356 Tinea cruris: Secondary | ICD-10-CM | POA: Diagnosis not present

## 2024-06-26 DIAGNOSIS — G250 Essential tremor: Secondary | ICD-10-CM

## 2024-06-26 MED ORDER — PROPRANOLOL HCL 40 MG PO TABS: 40.0000 mg | ORAL_TABLET | Freq: Every day | ORAL | 3 refills | Status: AC

## 2024-06-26 MED ORDER — FLUCONAZOLE 150 MG PO TABS: ORAL_TABLET | ORAL | 0 refills | Status: AC

## 2024-06-26 MED ORDER — NYSTATIN 100000 UNIT/GM EX POWD: 1.0000 | Freq: Three times a day (TID) | CUTANEOUS | 2 refills | Status: AC

## 2024-06-26 NOTE — Assessment & Plan Note (Signed)
 Will send in Nystatin  powder, stop ointment. Diflucan  sent in, take one dose this week and if not 100% improved can repeat in one week. Discussed with her to hold her Rosuvastatin  for 24 hours after taking Diflucan  due to risk for interaction. Keep area dry, especially after activity. If no improvement then return to office.

## 2024-06-26 NOTE — Progress Notes (Signed)
 BP 109/79 (BP Location: Left Arm, Patient Position: Sitting, Cuff Size: Normal)   Pulse 70   Temp 98.2 F (36.8 C) (Oral)   Resp 15   Ht 5' 2.99 (1.6 m)   Wt 162 lb 12.8 oz (73.8 kg)   SpO2 98%   BMI 28.85 kg/m    Subjective:    Patient ID: Michelle Espinoza, female    DOB: [birth year], 73 y.o.   MRN: [id number]  HPI: Michelle Espinoza is a 73 y.o. female  Chief Complaint  Patient presents with   Familial Tremor    Started Propranolol , some shaking still but does feel it has improved.    Rash    Bikini line and it gets better but never resolves. Very itchy.    Continues to have rash to bikini line, yeast. Nystatin  ointment not fully clearing this. Overnight she does notice she gets moisture to area.  TREMORS Started Propranolol  on 05/16/24, is tolerating this and noticing tremor is not as prominent. Tolerating well without dizziness. No issues with eating. Recurrent headaches: no Visual changes: no Palpitations: no Dyspnea: no Chest pain: no Lower extremity edema: no Dizzy/lightheaded: no   Relevant past medical, surgical, family and social history reviewed and updated as indicated. Interim medical history since our last visit reviewed. Allergies and medications reviewed and updated.  Review of Systems  Constitutional:  Negative for activity change, appetite change, diaphoresis, fatigue and fever.  Respiratory:  Negative for cough, chest tightness and shortness of breath.   Cardiovascular:  Negative for chest pain, palpitations and leg swelling.  Gastrointestinal: Negative.   Skin:  Positive for rash.  Neurological:  Positive for tremors.  Psychiatric/Behavioral: Negative.      Per HPI unless specifically indicated above     Objective:    BP 109/79 (BP Location: Left Arm, Patient Position: Sitting, Cuff Size: Normal)   Pulse 70   Temp 98.2 F (36.8 C) (Oral)   Resp 15   Ht 5' 2.99 (1.6 m)   Wt 162 lb 12.8 oz (73.8 kg)   SpO2 98%   BMI 28.85 kg/m   Wt  Readings from Last 3 Encounters:  06/26/24 162 lb 12.8 oz (73.8 kg)  05/16/24 165 lb 8 oz (75.1 kg)  05/13/24 165 lb (74.8 kg)    Physical Exam Vitals and nursing note reviewed.  Constitutional:      General: She is awake. She is not in acute distress.    Appearance: She is well-developed and well-groomed. She is not ill-appearing or toxic-appearing.  HENT:     Head: Normocephalic.     Right Ear: Hearing and external ear normal.     Left Ear: Hearing and external ear normal.  Eyes:     General: Lids are normal.        Right eye: No discharge.        Left eye: No discharge.     Conjunctiva/sclera: Conjunctivae normal.     Pupils: Pupils are equal, round, and reactive to light.  Neck:     Thyroid : No thyromegaly.     Vascular: No carotid bruit.  Cardiovascular:     Rate and Rhythm: Normal rate and regular rhythm.     Heart sounds: Normal heart sounds. No murmur heard.    No gallop.  Pulmonary:     Effort: Pulmonary effort is normal. No accessory muscle usage or respiratory distress.     Breath sounds: Normal breath sounds.  Abdominal:     General: Bowel sounds  are normal. There is no distension.     Palpations: Abdomen is soft.     Tenderness: There is no abdominal tenderness.  Musculoskeletal:     Cervical back: Normal range of motion and neck supple.     Right lower leg: No edema.     Left lower leg: No edema.  Lymphadenopathy:     Cervical: No cervical adenopathy.  Skin:    General: Skin is warm and dry.  Neurological:     Mental Status: She is alert and oriented to person, place, and time.     Cranial Nerves: Cranial nerves 2-12 are intact.     Motor: Tremor (upper extremities bilateral with movement) present.     Coordination: Coordination is intact.     Gait: Gait is intact.     Deep Tendon Reflexes: Reflexes are normal and symmetric.     Reflex Scores:      Brachioradialis reflexes are 2+ on the right side and 2+ on the left side.      Patellar reflexes are 2+  on the right side and 2+ on the left side.    Comments: No cogwheel noted. No flat affect. Normal gait.  Psychiatric:        Attention and Perception: Attention normal.        Mood and Affect: Mood normal.        Speech: Speech normal.        Behavior: Behavior normal. Behavior is cooperative.        Thought Content: Thought content normal.     Results for orders placed or performed in visit on 05/16/24  Comprehensive metabolic panel with GFR   Collection Time: 05/16/24  9:51 AM  Result Value Ref Range   Glucose 98 70 - 99 mg/dL   BUN 16 8 - 27 mg/dL   Creatinine, Ser 9.22 0.57 - 1.00 mg/dL   eGFR 82 >40 fO/fpw/8.26   BUN/Creatinine Ratio 21 12 - 28   Sodium 142 134 - 144 mmol/L   Potassium 4.7 3.5 - 5.2 mmol/L   Chloride 107 (H) 96 - 106 mmol/L   CO2 24 20 - 29 mmol/L   Calcium  9.4 8.7 - 10.3 mg/dL   Total Protein 6.6 6.0 - 8.5 g/dL   Albumin 4.3 3.8 - 4.8 g/dL   Globulin, Total 2.3 1.5 - 4.5 g/dL   Bilirubin Total 0.3 0.0 - 1.2 mg/dL   Alkaline Phosphatase 77 49 - 135 IU/L   AST 21 0 - 40 IU/L   ALT 17 0 - 32 IU/L  Lipid Panel w/o Chol/HDL Ratio   Collection Time: 05/16/24  9:51 AM  Result Value Ref Range   Cholesterol, Total 132 100 - 199 mg/dL   Triglycerides 67 0 - 149 mg/dL   HDL 54 >60 mg/dL   VLDL Cholesterol Cal 14 5 - 40 mg/dL   LDL Chol Calc (NIH) 64 0 - 99 mg/dL      Assessment & Plan:   Problem List Items Addressed This Visit       Nervous and Auditory   Benign familial tremor - Primary   Chronic, ongoing for years. Improving with Propranolol  at low dose. Family members have same. Continue Propranolol  as ordered, but monitor closely with her emphysema. If any worsening emphysema symptoms then will stop this and change to alternate medication for essential tremor. Discussed at length with her. HR today in 60 to 70's.  Educated her on medication and side effects to monitor for.  Do  not take medication if HR <60.  Consider neurology if worsening.         Musculoskeletal and Integument   Tinea cruris   Will send in Nystatin  powder, stop ointment. Diflucan  sent in, take one dose this week and if not 100% improved can repeat in one week. Discussed with her to hold her Rosuvastatin  for 24 hours after taking Diflucan  due to risk for interaction. Keep area dry, especially after activity. If no improvement then return to office.      Relevant Medications   nystatin  (MYCOSTATIN /NYSTOP ) powder   fluconazole  (DIFLUCAN ) 150 MG tablet     Follow up plan: Return in about 5 months (around 11/17/2024) for Annual Physical after 11/11/24.

## 2024-06-26 NOTE — Assessment & Plan Note (Signed)
 Chronic, ongoing for years. Improving with Propranolol  at low dose. Family members have same. Continue Propranolol  as ordered, but monitor closely with her emphysema. If any worsening emphysema symptoms then will stop this and change to alternate medication for essential tremor. Discussed at length with her. HR today in 60 to 70's.  Educated her on medication and side effects to monitor for.  Do not take medication if HR <60.  Consider neurology if worsening.

## 2024-07-24 ENCOUNTER — Ambulatory Visit (INDEPENDENT_AMBULATORY_CARE_PROVIDER_SITE_OTHER): Payer: Self-pay | Admitting: Emergency Medicine

## 2024-07-24 VITALS — Ht 63.0 in | Wt 165.0 lb

## 2024-07-24 DIAGNOSIS — Z1231 Encounter for screening mammogram for malignant neoplasm of breast: Secondary | ICD-10-CM

## 2024-07-24 DIAGNOSIS — Z0000 Encounter for general adult medical examination without abnormal findings: Secondary | ICD-10-CM

## 2024-07-24 DIAGNOSIS — Z780 Asymptomatic menopausal state: Secondary | ICD-10-CM

## 2024-07-24 NOTE — Patient Instructions (Signed)
 Michelle Espinoza,  Thank you for taking the time for your Medicare Wellness Visit. I appreciate your continued commitment to your health goals. Please review the care plan we discussed, and feel free to reach out if I can assist you further.  Please note that Annual Wellness Visits do not include a physical exam. Some assessments may be limited, especially if the visit was conducted virtually. If needed, we may recommend an in-person follow-up with your provider.  Ongoing Care Seeing your primary care provider every 3 to 6 months helps us  monitor your health and provide consistent, personalized care.    Referrals If a referral was made during today's visit and you haven't received any updates within two weeks, please contact the referred provider directly to check on the status.  Recommended Screenings:  Please call to schedule your mammogram and bone density scan (due after 09/04/24):  [facility] Address: [address], [place], [place] Phone: [phone]    Health Maintenance  Topic Date Due   Osteoporosis screening with Bone Density Scan  04/09/2022   COVID-19 Vaccine (4 - 2025-26 season) 03/10/2024   Medicare Annual Wellness Visit  07/11/2024   Breast Cancer Screening  09/04/2024   Screening for Lung Cancer  05/13/2025   Cologuard (Stool DNA test)  06/01/2026   DTaP/Tdap/Td vaccine (3 - Tdap) 01/10/2027   Pneumococcal Vaccine for age over 45  Completed   Flu Shot  Completed   Hepatitis C Screening  Completed   Zoster (Shingles) Vaccine  Completed   Meningitis B Vaccine  Aged Out       07/24/2024   10:48 AM  Advanced Directives  Does Patient Have a Medical Advance Directive? No  Would patient like information on creating a medical advance directive? No - Patient declined    Vision: Annual vision screenings are recommended for early detection of glaucoma, cataracts, and diabetic retinopathy. These exams can also reveal signs  of chronic conditions such as diabetes and high blood pressure.  Dental: Annual dental screenings help detect early signs of oral cancer, gum disease, and other conditions linked to overall health, including heart disease and diabetes.  Please see the attached documents for additional preventive care recommendations.

## 2024-07-24 NOTE — Progress Notes (Signed)
 "  Chief Complaint  Patient presents with   Medicare Wellness     Subjective:   Michelle Espinoza is a 74 y.o. female who presents for a Medicare Annual Wellness Visit.  Visit info / Clinical Intake: Medicare Wellness Visit Type:: Subsequent Annual Wellness Visit Persons participating in visit and providing information:: patient Medicare Wellness Visit Mode:: Video Since this visit was completed virtually, some vitals may be partially provided or unavailable. Missing vitals are due to the limitations of the virtual format.: Documented vitals are patient reported If Telephone or Video please confirm:: I connected with patient using audio/video enable telemedicine. I verified patient identity with two identifiers, discussed telehealth limitations, and patient agreed to proceed. Patient Location:: home Provider Location:: clinic office Interpreter Needed?: No Pre-visit prep was completed: yes AWV questionnaire completed by patient prior to visit?: yes Date:: 07/22/24 Living arrangements:: with family/others Patient's Overall Health Status Rating: very good Typical amount of pain: none Does pain affect daily life?: no Are you currently prescribed opioids?: no  Dietary Habits and Nutritional Risks How many meals a day?: 2 Eats fruit and vegetables daily?: (!) no Most meals are obtained by: eating out In the last 2 weeks, have you had any of the following?: none Diabetic:: no  Functional Status Activities of Daily Living (to include ambulation/medication): Independent Ambulation: Independent with device- listed below Home Assistive Devices/Equipment: Eyeglasses Medication Administration: Independent Home Management (perform basic housework or laundry): Independent Manage your own finances?: yes Primary transportation is: driving Concerns about vision?: no *vision screening is required for WTM* Concerns about hearing?: no  Fall Screening Falls in the past year?: 0 Number of  falls in past year: 0 Was there an injury with Fall?: 0 Fall Risk Category Calculator: 0 Patient Fall Risk Level: Low Fall Risk  Fall Risk Patient at Risk for Falls Due to: No Fall Risks Fall risk Follow up: Falls evaluation completed  Home and Transportation Safety: All rugs have non-skid backing?: yes All stairs or steps have railings?: N/A, no stairs Grab bars in the bathtub or shower?: (!) no Have non-skid surface in bathtub or shower?: (!) no Good home lighting?: yes Regular seat belt use?: yes Hospital stays in the last year:: no  Cognitive Assessment Difficulty concentrating, remembering, or making decisions? : no Will 6CIT or Mini Cog be Completed: yes What year is it?: 0 points What month is it?: 0 points Give patient an address phrase to remember (5 components): [address] [place] About what time is it?: 0 points Count backwards from 20 to 1: 0 points Say the months of the year in reverse: 0 points Repeat the address phrase from earlier: 0 points 6 CIT Score: 0 points  Advance Directives (For Healthcare) Does Patient Have a Medical Advance Directive?: No Would patient like information on creating a medical advance directive?: No - Patient declined  Reviewed/Updated  Reviewed/Updated: Reviewed All (Medical, Surgical, Family, Medications, Allergies, Care Teams, Patient Goals)    Allergies (verified) Lovastatin and Penicillins   Current Medications (verified) Outpatient Encounter Medications as of 07/24/2024  Medication Sig   albuterol  (VENTOLIN  HFA) 108 (90 Base) MCG/ACT inhaler Inhale 2 puffs into the lungs every 6 (six) hours as needed.   aspirin EC 81 MG tablet Take 81 mg by mouth daily.   buPROPion  ER (WELLBUTRIN  SR) 100 MG 12 hr tablet Take 1 tablet (100 mg total) by mouth 2 (two) times daily.   cyanocobalamin  2000 MCG tablet Take 2,000 mcg by mouth daily.  ezetimibe  (ZETIA ) 10 MG tablet Take 1 tablet (10 mg total) by mouth daily.   FLUoxetine   (PROZAC ) 20 MG capsule Take 1 capsule (20 mg total) by mouth daily.   ketoconazole  (NIZORAL ) 2 % shampoo Apply 1 Application topically 2 (two) times a week.   levothyroxine  (SYNTHROID ) 25 MCG tablet Take 1 tablet (25 mcg total) by mouth daily before breakfast.   loratadine (CLARITIN REDITABS) 10 MG dissolvable tablet Take 10 mg by mouth daily.   losartan -hydrochlorothiazide  (HYZAAR) 100-12.5 MG tablet Take 1 tablet by mouth daily.   Magnesium Hydroxide (MAGNESIA PO) Take by mouth.   Multiple Vitamin (MULTIVITAMIN) tablet Take 1 tablet by mouth daily.   Multiple Vitamins-Minerals (VITAMIN D3 COMPLETE PO) Take by mouth.   nystatin  (MYCOSTATIN /NYSTOP ) powder Apply 1 Application topically 3 (three) times daily.   propranolol  (INDERAL ) 40 MG tablet Take 1 tablet (40 mg total) by mouth daily.   rosuvastatin  (CRESTOR ) 40 MG tablet Take 1 tablet (40 mg total) by mouth daily.   fluconazole  (DIFLUCAN ) 150 MG tablet Take one dose, 150 MG, by mouth once this week and then if rash not cleared may repeat dose in one week. (Patient not taking: Reported on 07/24/2024)   No facility-administered encounter medications on file as of 07/24/2024.    History: Past Medical History:  Diagnosis Date   Allergy    Depression    Essential hypertension    Hyperlipidemia    Restless legs    Past Surgical History:  Procedure Laterality Date   ABDOMINAL HYSTERECTOMY     Family History  Adopted: Yes  Problem Relation Age of Onset   Breast cancer Neg Hx    Social History   Occupational History   Occupation: retired  Tobacco Use   Smoking status: Every Day    Current packs/day: 0.50    Average packs/day: 0.5 packs/day for 56.0 years (28.0 ttl pk-yrs)    Types: Cigarettes    Start date: 1970   Smokeless tobacco: Never  Vaping Use   Vaping status: Never Used  Substance and Sexual Activity   Alcohol use: Yes    Comment: 1-2 cocktails less than monthly   Drug use: No   Sexual activity: Not on file    Tobacco Counseling Ready to quit: Not Answered Counseling given: Not Answered  SDOH Screenings   Food Insecurity: No Food Insecurity (07/24/2024)  Housing: Low Risk (07/24/2024)  Transportation Needs: No Transportation Needs (07/24/2024)  Utilities: Not At Risk (07/24/2024)  Alcohol Screen: Low Risk (07/22/2024)  Depression (PHQ2-9): Low Risk (07/24/2024)  Financial Resource Strain: Low Risk (07/22/2024)  Physical Activity: Inactive (07/24/2024)  Social Connections: Moderately Isolated (07/24/2024)  Stress: No Stress Concern Present (07/24/2024)  Tobacco Use: High Risk (07/24/2024)  Health Literacy: Adequate Health Literacy (07/24/2024)   See flowsheets for full screening details  Depression Screen PHQ 2 & 9 Depression Scale- Over the past 2 weeks, how often have you been bothered by any of the following problems? Little interest or pleasure in doing things: 0 Feeling down, depressed, or hopeless (PHQ Adolescent also includes...irritable): 0 PHQ-2 Total Score: 0 Trouble falling or staying asleep, or sleeping too much: 0 Feeling tired or having little energy: 0 Poor appetite or overeating (PHQ Adolescent also includes...weight loss): 0 Feeling bad about yourself - or that you are a failure or have let yourself or your family down: 0 Trouble concentrating on things, such as reading the newspaper or watching television (PHQ Adolescent also includes...like school work): 0 Moving or speaking so slowly that  other people could have noticed. Or the opposite - being so fidgety or restless that you have been moving around a lot more than usual: 0 Thoughts that you would be better off dead, or of hurting yourself in some way: 0 PHQ-9 Total Score: 0 If you checked off any problems, how difficult have these problems made it for you to do your work, take care of things at home, or get along with other people?: Not difficult at all  Depression Treatment Depression Interventions/Treatment : EYV7-0 Score  <4 Follow-up Not Indicated; Currently on Treatment     Goals Addressed             This Visit's Progress    Exercise 3x per week (30 min per time)   Not on track            Objective:    Today's Vitals   07/24/24 1041  Weight: 165 lb (74.8 kg)  Height: 5' 3 (1.6 m)   Body mass index is 29.23 kg/m.  Hearing/Vision screen Hearing Screening - Comments:: Denies hearing loss  Vision Screening - Comments:: UTD w/ [facility] Immunizations and Health Maintenance Health Maintenance  Topic Date Due   Bone Density Scan  04/09/2022   COVID-19 Vaccine (4 - 2025-26 season) 03/10/2024   Mammogram  09/04/2024   Lung Cancer Screening  05/13/2025   Medicare Annual Wellness (AWV)  07/24/2025   Fecal DNA (Cologuard)  06/01/2026   DTaP/Tdap/Td (3 - Tdap) 01/10/2027   Pneumococcal Vaccine: 50+ Years  Completed   Influenza Vaccine  Completed   Hepatitis C Screening  Completed   Zoster Vaccines- Shingrix  Completed   Meningococcal B Vaccine  Aged Out        Assessment/Plan:  This is a routine wellness examination for Deshaun.  Patient Care Team: Valerio Melanie DASEN, NP as PCP - General (Nurse Practitioner) Chiropractic, [place] (Chiropractic [facility], [facility])  I have personally reviewed and noted the following in the patients chart:   Medical and social history Use of alcohol, tobacco or illicit drugs  Current medications and supplements including opioid prescriptions. Functional ability and status Nutritional status Physical activity Advanced directives List of other physicians Hospitalizations, surgeries, and ER visits in previous 12 months Vitals Screenings to include cognitive, depression, and falls Referrals and appointments  Orders Placed This Encounter  Procedures   MM 3D SCREENING MAMMOGRAM BILATERAL BREAST    Standing Status:   Future    Expected Date:   09/04/2024    Expiration Date:   07/24/2025     Scheduling Instructions:     Would like to have same visit as DEXA    Reason for Exam (SYMPTOM  OR DIAGNOSIS REQUIRED):   screening for breast cancer    Preferred imaging location?:   [facility]   DG Bone Density    Standing Status:   Future    Expected Date:   09/04/2024    Expiration Date:   07/24/2025    Scheduling Instructions:     Would like to have same visit as MMG    Reason for Exam (SYMPTOM  OR DIAGNOSIS REQUIRED):   postmenopausal estrogen deficiency    Preferred imaging location?:   [facility]   In addition, I have reviewed and discussed with patient certain preventive protocols, quality metrics, and best practice recommendations. A written personalized care plan for preventive services as well as general preventive health recommendations were provided to patient.   Vina  Debby, CMA   07/24/2024   Return in 53 weeks (on 07/30/2025) for Medicare Annual Wellness Visit.  After Visit Summary: (MyChart) Due to this being a telephonic visit, the after visit summary with patients personalized plan was offered to patient via MyChart   Nurse Notes:  Placed order for MMG (due ~ 09/04/24) and DEXA scan Declined Covid vaccine "

## 2024-11-26 ENCOUNTER — Encounter: Admitting: Nurse Practitioner

## 2025-07-30 ENCOUNTER — Ambulatory Visit
